# Patient Record
Sex: Male | Born: 1970 | Race: Black or African American | Hispanic: No | Marital: Married | Smoking: Never smoker
Health system: Southern US, Community
[De-identification: ages and names within clinical notes are randomized; demographics above are authoritative.]

## PROBLEM LIST (undated history)

## (undated) DIAGNOSIS — E785 Hyperlipidemia, unspecified: Secondary | ICD-10-CM

## (undated) DIAGNOSIS — E119 Type 2 diabetes mellitus without complications: Secondary | ICD-10-CM

## (undated) DIAGNOSIS — Z8613 Personal history of malaria: Secondary | ICD-10-CM

## (undated) HISTORY — DX: Type 2 diabetes mellitus without complications: E11.9

## (undated) HISTORY — DX: Personal history of malaria: Z86.13

## (undated) HISTORY — DX: Hyperlipidemia, unspecified: E78.5

---

## 1997-01-09 DIAGNOSIS — Z8613 Personal history of malaria: Secondary | ICD-10-CM

## 1997-01-09 HISTORY — DX: Personal history of malaria: Z86.13

## 2000-01-10 HISTORY — PX: KNEE ARTHROSCOPY: SUR90

## 2012-12-10 ENCOUNTER — Encounter: Payer: Self-pay | Admitting: Family Medicine

## 2012-12-10 ENCOUNTER — Ambulatory Visit (INDEPENDENT_AMBULATORY_CARE_PROVIDER_SITE_OTHER): Payer: BC Managed Care – PPO | Admitting: Family Medicine

## 2012-12-10 VITALS — BP 132/90 | HR 66 | Temp 98.1°F | Ht 69.0 in | Wt 181.0 lb

## 2012-12-10 DIAGNOSIS — R5381 Other malaise: Secondary | ICD-10-CM

## 2012-12-10 DIAGNOSIS — R5383 Other fatigue: Secondary | ICD-10-CM | POA: Insufficient documentation

## 2012-12-10 NOTE — Progress Notes (Signed)
   Subjective:    Patient ID: Gary Long, male    DOB: [birth year], 42 y.o.   MRN: [id number]  HPI CC: new pt to establish  Prior PCP , Dr Link Snuffer at [facility]  Works at [employer] - 3rd shift 3d/wk.  Melatonin has caused trouble adjusting so stopped taking.  Feels stays fatigued. bp slightly elevated today (higher than his normal). Recently took mucinex for cough which is now better.  Some knee pain s/p arthroscopy 2002 but persistent R knee discomfort.  Limits running.  Had MRI and steroid shot, as well as PT.  Preventative:  Last CPE 05/2012, cholesterol levels slightly elevated last check. Flu shot - done Tetanus - unsure.  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  Past histories reviewed and updated if relevant as below. There are no active problems to display for this patient.  Past Medical History  Diagnosis Date  . History of malaria 1999   Past Surgical History  Procedure Laterality Date  . Knee arthroscopy Right 2002   History  Substance Use Topics  . Smoking status: Never Smoker   . Smokeless tobacco: Never Used  . Alcohol Use: No   Family History  Problem Relation Age of Onset  . Stroke Mother   . Diabetes Mother   . Hypertension Sister   . Cancer Neg Hx   . CAD Neg Hx    Allergies  Allergen Reactions  . Chloroquine Itching   No current outpatient prescriptions on file prior to visit.   No current facility-administered medications on file prior to visit.     Review of Systems Per HPI    Objective:   Physical Exam  Nursing note and vitals reviewed. Constitutional: He is oriented to person, place, and time. He appears well-developed and well-nourished. No distress.  HENT:  Head: Normocephalic and atraumatic.  Right Ear: External ear normal.  Left Ear: External  ear normal.  Nose: Nose normal.  Mouth/Throat: Oropharynx is clear and moist. No oropharyngeal exudate.  Eyes: Conjunctivae and EOM are normal. Pupils are equal, round, and reactive to light. No scleral icterus.  Neck: Normal range of motion. Neck supple. No thyromegaly present.  Cardiovascular: Normal rate, regular rhythm, normal heart sounds and intact distal pulses.   No murmur heard. Pulses:      Radial pulses are 2+ on the right side, and 2+ on the left side.  Pulmonary/Chest: Effort normal and breath sounds normal. No respiratory distress. He has no wheezes. He has no rales.  Musculoskeletal: Normal range of motion. He exhibits no edema.  Lymphadenopathy:    He has no cervical adenopathy.  Neurological: He is alert and oriented to person, place, and time.  CN grossly intact, station and gait intact  Skin: Skin is warm and dry. No rash noted.  Psychiatric: He has a normal mood and affect. His behavior is normal. Judgment and thought content normal.       Assessment & Plan:

## 2012-12-10 NOTE — Progress Notes (Signed)
Pre-visit discussion using our clinic review tool. No additional management support is needed unless otherwise documented below in the visit note.  

## 2012-12-10 NOTE — Patient Instructions (Signed)
Good to meet you today, call us with questions. Return when you're due for next physical.

## 2012-12-10 NOTE — Assessment & Plan Note (Signed)
Anticipate related to 3rd shift work schedule. Discussed sleep hygiene measures to try including decreased stimulation prior to bedtime, calm quiet dark environment for sleep, and consider light excluding curtains. Will reassess next visit.

## 2013-01-13 ENCOUNTER — Encounter: Payer: Self-pay | Admitting: Internal Medicine

## 2013-01-13 ENCOUNTER — Ambulatory Visit (INDEPENDENT_AMBULATORY_CARE_PROVIDER_SITE_OTHER): Payer: BC Managed Care – PPO | Admitting: Internal Medicine

## 2013-01-13 VITALS — BP 118/72 | HR 96 | Temp 98.3°F | Wt 182.5 lb

## 2013-01-13 DIAGNOSIS — T148XXA Other injury of unspecified body region, initial encounter: Secondary | ICD-10-CM

## 2013-01-13 DIAGNOSIS — M940 Chondrocostal junction syndrome [Tietze]: Secondary | ICD-10-CM

## 2013-01-13 MED ORDER — IBUPROFEN 800 MG PO TABS: 800.0000 mg | ORAL_TABLET | Freq: Three times a day (TID) | ORAL | Status: DC | PRN

## 2013-01-13 MED ORDER — CYCLOBENZAPRINE HCL 10 MG PO TABS: 10.0000 mg | ORAL_TABLET | Freq: Three times a day (TID) | ORAL | Status: DC | PRN

## 2013-01-13 NOTE — Patient Instructions (Signed)
Costochondritis Costochondritis (Tietze syndrome), or costochondral separation, is a swelling and irritation (inflammation) of the tissue (cartilage) that connects your ribs with your breastbone (sternum). It may occur on its own (spontaneously), through damage caused by an accident (trauma), or simply from coughing or minor exercise. It may take up to 6 weeks to get better and longer if you are unable to be conservative in your activities. HOME CARE INSTRUCTIONS   Avoid exhausting physical activity. Try not to strain your ribs during normal activity. This would include any activities using chest, belly (abdominal), and side muscles, especially if heavy weights are used.  Use ice for 15-20 minutes per hour while awake for the first 2 days. Place the ice in a plastic bag, and place a towel between the bag of ice and your skin.  Only take over-the-counter or prescription medicines for pain, discomfort, or fever as directed by your caregiver. SEEK IMMEDIATE MEDICAL CARE IF:   Your pain increases or you are very uncomfortable.  You have a fever.  You develop difficulty with your breathing.  You cough up blood.  You develop worse chest pains, shortness of breath, sweating, or vomiting.  You develop new, unexplained problems (symptoms). MAKE SURE YOU:   Understand these instructions.  Will watch your condition.  Will get help right away if you are not doing well or get worse. Document Released: 10/05/2004 Document Revised: 03/20/2011 Document Reviewed: 07/30/2012 Exit[employer] Patient Information 2014 [employer].  

## 2013-01-13 NOTE — Progress Notes (Signed)
Pre-visit discussion using our clinic review tool. No additional management support is needed unless otherwise documented below in the visit note.  

## 2013-01-13 NOTE — Progress Notes (Signed)
   Subjective:    Patient ID: Gary Long, male    DOB: 29-Mar-1970, 43 y.o.   MRN: [id number]  HPI  Pt presents to the clinic today with c/o chest discomfort and back pain. This started about 3 weeks ago. The chest discomfort is on both sides of his sternum. His pain occurs with bending, twisting and lifting. He denies chest pain, chest tightness, reflux, or shortness of breath. He denies any specific injury. He has taken tylenol OTC without much relief.  Review of Systems      Past Medical History  Diagnosis Date  . History of malaria 1999    No current outpatient prescriptions on file.   No current facility-administered medications for this visit.    Allergies  Allergen Reactions  . Chloroquine Itching    Family History  Problem Relation Age of Onset  . Stroke Mother   . Diabetes Mother   . Hypertension Sister   . Cancer Neg Hx   . CAD Neg Hx     History   Social History  . Marital Status: Married    Spouse Name: N/A    Number of Children: N/A  . Years of Education: N/A   Occupational History  . Not on file.   Social History Main Topics  . Smoking status: Never Smoker   . Smokeless tobacco: Never Used  . Alcohol Use: No  . Drug Use: No  . Sexual Activity: Not on file   Other Topics Concern  . 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  Respiratory: Denies difficulty breathing, shortness of breath, cough or sputum production.   Cardiovascular: Denies chest pain, chest tightness, palpitations or swelling in the hands or feet.  Musculoskeletal: Denies decrease in range of motion, difficulty with gait, or joint pain and swelling.    No other specific complaints in a complete review of systems (except  as listed in HPI above).  Objective:   Physical Exam  BP 118/72  Pulse 96  Temp(Src) 98.3 F (36.8 C) (Oral)  Wt 182 lb 8 oz (82.781 kg)  SpO2 99% Wt Readings from Last 3 Encounters:  01/13/13 182 lb 8 oz (82.781 kg)  12/10/12 181 lb (82.101 kg)    General: Appears his stated age, well developed, well nourished in NAD. Cardiovascular: Normal rate and rhythm. S1,S2 noted.  No murmur, rubs or gallops noted. No JVD or BLE edema. No carotid bruits noted. Pulmonary/Chest: Normal effort and positive vesicular breath sounds. No respiratory distress. No wheezes, rales or ronchi noted.  Musculoskeletal: Normal range of motion. No signs of joint swelling. No difficulty with gait. Pinpoint tenderness on costochondral junction bilateral. Trapezius muscle noted to be tense and tight.        Assessment & Plan:   Muscle strain and costochondritis:  eRx for 800 mg advil every 8 hours as needed prn eRx for flexeril at night only Pt advised to perform stretching exercises as indicated on handout and avoid heavy lifting for the next week  RTC as needed or if symptoms persist or worsen

## 2013-01-31 LAB — COMPREHENSIVE METABOLIC PANEL
ALT: 29 U/L (ref 10–40)
AST: 23 U/L
Alkaline Phosphatase: 76 U/L
BUN: 14 mg/dL (ref 4–21)
CREATININE: 1
Glucose: 107
Potassium: 4.4 mmol/L
SODIUM: 138 mmol/L (ref 137–147)
Total Bilirubin: 0.4 mg/dL

## 2013-01-31 LAB — PSA: PSA: 1.124

## 2013-01-31 LAB — LIPID PANEL
Cholesterol: 228 mg/dL — AB (ref 0–200)
HDL: 47 mg/dL (ref 35–70)
LDL (calc): 163
TRIGLYCERIDES: 91

## 2013-01-31 LAB — CBC
HGB: 13.8 g/dL
WBC: 4.9
platelet count: 155

## 2013-01-31 LAB — TESTOSTERONE
Testosterone, Free: 8
Testosterone: 444

## 2013-01-31 LAB — TSH: TSH: 0.91

## 2013-02-04 ENCOUNTER — Ambulatory Visit: Payer: Self-pay | Admitting: Family Medicine

## 2013-03-25 ENCOUNTER — Ambulatory Visit: Payer: BC Managed Care – PPO | Admitting: Internal Medicine

## 2013-03-25 ENCOUNTER — Emergency Department: Payer: Self-pay | Admitting: Emergency Medicine

## 2013-03-25 LAB — RAPID INFLUENZA A&B ANTIGENS

## 2013-03-28 ENCOUNTER — Ambulatory Visit (INDEPENDENT_AMBULATORY_CARE_PROVIDER_SITE_OTHER): Payer: BC Managed Care – PPO | Admitting: Family Medicine

## 2013-03-28 ENCOUNTER — Encounter: Payer: Self-pay | Admitting: Family Medicine

## 2013-03-28 VITALS — BP 132/90 | HR 72 | Temp 97.8°F | Wt 183.8 lb

## 2013-03-28 DIAGNOSIS — J209 Acute bronchitis, unspecified: Secondary | ICD-10-CM

## 2013-03-28 DIAGNOSIS — S29012A Strain of muscle and tendon of back wall of thorax, initial encounter: Secondary | ICD-10-CM

## 2013-03-28 DIAGNOSIS — S239XXA Sprain of unspecified parts of thorax, initial encounter: Secondary | ICD-10-CM

## 2013-03-28 DIAGNOSIS — R079 Chest pain, unspecified: Secondary | ICD-10-CM

## 2013-03-28 DIAGNOSIS — R0789 Other chest pain: Secondary | ICD-10-CM | POA: Insufficient documentation

## 2013-03-28 MED ORDER — NAPROXEN 500 MG PO TABS: ORAL_TABLET | ORAL | Status: DC

## 2013-03-28 MED ORDER — METHOCARBAMOL 500 MG PO TABS: 500.0000 mg | ORAL_TABLET | Freq: Three times a day (TID) | ORAL | Status: DC | PRN

## 2013-03-28 NOTE — Progress Notes (Signed)
BP 132/90  Pulse 72  Temp(Src) 97.8 F (36.6 C) (Oral)  Wt 183 lb 12 oz (83.348 kg)  SpO2 97%   CC: ER f/u  Subjective:    Patient ID: Gary Long, male    DOB: 1970-12-29, 43 y.o.   MRN: [id number]  HPI: Gary Long is a 43 y.o. male presenting on 03/28/2013 for Follow-up   [facility] for chest pain, back pain and congestion (03/24/2013).  Dx with bronchitis and placed on zpack.  CXR was ok per patient report.  Seen last month with dx costochondritis, treated with flexeril (oversedation) and ibuprofen (which helps but only temporarily).  Hydrocodone prescribed at hospital but causes headache.  Feeling much better but with persistent chest and left back pain.  Intermittently sharp stabbing, other times tight.  Worse with coughing and prolonged standing.  Has also tried tylenol for discomfort.  No CAD hx in family.  Runs for exercise - twice a week for 1 mile.  No pain with running.  Still with dry cough, but sleeping ok.  Slight headache.  Denies wheezing or dyspnea, fevers, abd pain, nausea.  Finished zpack this morning.  Describes persistent sternal pain reproducible with palpation as well as pain at rhomboid L>R also reproducible with palpation.  No pain with arm/shoulder movements.  Denies inciting trauma/injury but pain did start after heavy lifting while moving.  ADDENDUM ==> [facility] - flu swab negative, CXR WNL asked to scan.  Relevant past medical, surgical, family and social history reviewed and updated as indicated.  Allergies and medications reviewed and updated. No current outpatient prescriptions on file prior to visit.   No current facility-administered medications on file prior to visit.    Review of Systems Per HPI unless specifically indicated above    Objective:    BP 132/90  Pulse 72  Temp(Src) 97.8 F (36.6 C) (Oral)  Wt 183 lb 12 oz (83.348 kg)  SpO2 97%  Physical Exam  Nursing note and vitals  reviewed. Constitutional: He appears well-developed and well-nourished. No distress.  HENT:  Head: Normocephalic and atraumatic.  Right Ear: Hearing, tympanic membrane, external ear and ear canal normal.  Left Ear: Hearing, tympanic membrane, external ear and ear canal normal.  Nose: Nose normal. No mucosal edema or rhinorrhea. Right sinus exhibits no maxillary sinus tenderness and no frontal sinus tenderness. Left sinus exhibits no maxillary sinus tenderness and no frontal sinus tenderness.  Mouth/Throat: Uvula is midline, oropharynx is clear and moist and mucous membranes are normal. No oropharyngeal exudate, posterior oropharyngeal edema, posterior oropharyngeal erythema or tonsillar abscesses.  Persistent nasal congestion L>R  Eyes: Conjunctivae and EOM are normal. Pupils are equal, round, and reactive to light. No scleral icterus.  Neck: Normal range of motion. Neck supple.  Cardiovascular: Normal rate, regular rhythm, normal heart sounds and intact distal pulses.   No murmur heard. Pulmonary/Chest: Effort normal and breath sounds normal. No respiratory distress. He has no wheezes. He has no rales. He exhibits tenderness (mild midline tenderness to palpation of sternum).  Musculoskeletal: He exhibits no edema.  No midline spine tenderness. Tender to palpation at L rhomboids. FROM at shoulders No pain to palpation of shoulder landmarks  Lymphadenopathy:    He has no cervical adenopathy.  Skin: Skin is warm and dry. No rash noted.   No results found for this or any previous visit.    Assessment & Plan:   Problem List Items Addressed This Visit   Acute bronchitis -  Primary     Reassured - should continue to heal well.  Pt declines cough syrup today.    Rhomboid muscle strain     Provided with exercises from [facility] pt advisor, rec nsaid and robaxin prn.  See pt instructions for plan. If no better consider PT referral.    Sternal pain     Unclear etiology.  Not quite consistent with  costochondritis today as he has more midline discomfort to palpation, and doesn't give trauma hx for sternal contusion/fracture. Will treat supportively as per above with NSAID, muscle relaxant.   Update if not improving.        Follow up plan: Return if symptoms worsen or fail to improve.

## 2013-03-28 NOTE — Assessment & Plan Note (Signed)
Provided with exercises from [facility] pt advisor, rec nsaid and robaxin prn.  See pt instructions for plan. If no better consider PT referral.

## 2013-03-28 NOTE — Patient Instructions (Signed)
I think you have sternal sprain and rhomboid strain. Treat with stretching exercises provided today, start methocarbamol muscle relaxant (may also make you sleepy), naprosyn twice daily with food for 5 days then as needed, and ice/heat to back (whichever soothes better) Let me know how you are doing with above treatment plan.

## 2013-03-28 NOTE — Assessment & Plan Note (Signed)
Unclear etiology.  Not quite consistent with costochondritis today as he has more midline discomfort to palpation, and doesn't give trauma hx for sternal contusion/fracture. Will treat supportively as per above with NSAID, muscle relaxant.   Update if not improving.

## 2013-03-28 NOTE — Progress Notes (Signed)
Pre visit review using our clinic review tool, if applicable. No additional management support is needed unless otherwise documented below in the visit note. 

## 2013-03-28 NOTE — Assessment & Plan Note (Signed)
Reassured - should continue to heal well.  Pt declines cough syrup today.

## 2013-03-29 ENCOUNTER — Encounter: Payer: Self-pay | Admitting: Family Medicine

## 2013-04-28 ENCOUNTER — Encounter: Payer: Self-pay | Admitting: *Deleted

## 2013-06-13 ENCOUNTER — Other Ambulatory Visit (INDEPENDENT_AMBULATORY_CARE_PROVIDER_SITE_OTHER): Payer: BC Managed Care – PPO

## 2013-06-13 ENCOUNTER — Telehealth: Payer: Self-pay | Admitting: *Deleted

## 2013-06-13 DIAGNOSIS — E785 Hyperlipidemia, unspecified: Secondary | ICD-10-CM

## 2013-06-13 DIAGNOSIS — R5383 Other fatigue: Secondary | ICD-10-CM

## 2013-06-13 DIAGNOSIS — R5381 Other malaise: Secondary | ICD-10-CM

## 2013-06-13 HISTORY — DX: Hyperlipidemia, unspecified: E78.5

## 2013-06-13 LAB — LIPID PANEL
CHOLESTEROL: 216 mg/dL — AB (ref 0–200)
HDL: 62.9 mg/dL (ref >39.00)
LDL Cholesterol: 142 mg/dL — ABNORMAL HIGH (ref 0–99)
NonHDL: 153.1
TRIGLYCERIDES: 58 mg/dL (ref 0.0–149.0)
Total CHOL/HDL Ratio: 3
VLDL: 11.6 mg/dL (ref 0.0–40.0)

## 2013-06-13 LAB — VITAMIN B12: Vitamin B-12: 476 pg/mL (ref 211–911)

## 2013-06-13 NOTE — Telephone Encounter (Signed)
Labs ordered today.Thanks.

## 2013-06-13 NOTE — Telephone Encounter (Signed)
Pt walked in this am for labs. He stated he was called to come in for labs prior to his physical scheduled on 06/27/13. I could not find any lab orders for him on record. He insisted that he was called to come in, and that he had a physical scheduled. I didn't see any record of him having been contacted regarding a lab appt.  I checked appt history, but neither appointments were scheduled, with no record of any having ever been scheduled. Per the front office staff at check in, he had walked in and made the appt today.  I informed him that I did not have lab orders, and that he may not be due for labs since he just had labs done in 1/15. I also informed him that I could draw his blood, but that it would be discarded if his Dr determined no labs needed to be done. He agreed, and I drew his blood. I also advised him to stop by the front desk to schedule an appointment for cpx, which he agreed to do. Does he need to have any labs drawn for a physical?

## 2013-11-27 ENCOUNTER — Ambulatory Visit (INDEPENDENT_AMBULATORY_CARE_PROVIDER_SITE_OTHER): Payer: BC Managed Care – PPO | Admitting: Family Medicine

## 2013-11-27 ENCOUNTER — Encounter: Payer: Self-pay | Admitting: Family Medicine

## 2013-11-27 ENCOUNTER — Encounter: Payer: BC Managed Care – PPO | Admitting: Family Medicine

## 2013-11-27 VITALS — BP 124/82 | HR 88 | Temp 98.0°F | Ht 69.0 in | Wt 172.5 lb

## 2013-11-27 DIAGNOSIS — Z0000 Encounter for general adult medical examination without abnormal findings: Secondary | ICD-10-CM

## 2013-11-27 DIAGNOSIS — S83289A Other tear of lateral meniscus, current injury, unspecified knee, initial encounter: Secondary | ICD-10-CM | POA: Insufficient documentation

## 2013-11-27 DIAGNOSIS — Z0001 Encounter for general adult medical examination with abnormal findings: Secondary | ICD-10-CM | POA: Insufficient documentation

## 2013-11-27 DIAGNOSIS — M25562 Pain in left knee: Secondary | ICD-10-CM

## 2013-11-27 DIAGNOSIS — E119 Type 2 diabetes mellitus without complications: Secondary | ICD-10-CM

## 2013-11-27 DIAGNOSIS — E1169 Type 2 diabetes mellitus with other specified complication: Secondary | ICD-10-CM | POA: Insufficient documentation

## 2013-11-27 DIAGNOSIS — E118 Type 2 diabetes mellitus with unspecified complications: Secondary | ICD-10-CM | POA: Insufficient documentation

## 2013-11-27 DIAGNOSIS — F524 Premature ejaculation: Secondary | ICD-10-CM

## 2013-11-27 DIAGNOSIS — R350 Frequency of micturition: Secondary | ICD-10-CM

## 2013-11-27 DIAGNOSIS — R739 Hyperglycemia, unspecified: Secondary | ICD-10-CM

## 2013-11-27 DIAGNOSIS — E785 Hyperlipidemia, unspecified: Secondary | ICD-10-CM

## 2013-11-27 HISTORY — DX: Type 2 diabetes mellitus without complications: E11.9

## 2013-11-27 LAB — POCT URINALYSIS DIPSTICK
BILIRUBIN UA: NEGATIVE
Blood, UA: NEGATIVE
GLUCOSE UA: NEGATIVE
KETONES UA: NEGATIVE
Leukocytes, UA: NEGATIVE
Nitrite, UA: NEGATIVE
Protein, UA: NEGATIVE
SPEC GRAV UA: 1.015
Urobilinogen, UA: 0.2
pH, UA: 6

## 2013-11-27 NOTE — Assessment & Plan Note (Signed)
Anticipate PFPS on left. Marked crepitus of patellofemoral compartment noted today. rec lateral leg raises and handout from [facility] pt advisor provided today.

## 2013-11-27 NOTE — Assessment & Plan Note (Signed)
Reviewed previous labwork. LDL mildly elevated off meds.

## 2013-11-27 NOTE — Patient Instructions (Addendum)
Return at your convenience for fasting labs - to check sugar level. For knee - do lateral leg raises to strengthen medial quads. Exercises also provided today. Urine looking normal today. Good to see you today, call us with questions.

## 2013-11-27 NOTE — Progress Notes (Signed)
Pre visit review using our clinic review tool, if applicable. No additional management support is needed unless otherwise documented below in the visit note. 

## 2013-11-27 NOTE — Assessment & Plan Note (Signed)
UA normal today. No nocturia, doubt BPH related.

## 2013-11-27 NOTE — Progress Notes (Signed)
BP 124/82 mmHg  Pulse 88  Temp(Src) 98 F (36.7 C) (Oral)  Ht 5\' 9"  (1.753 m)  Wt 172 lb 8 oz (78.245 kg)  BMI 25.46 kg/m2   CC: CPE  Subjective:    Patient ID: Gary Long, male    DOB: July 15, 1970, 43 y.o.   MRN: [id number]  HPI: Gary Long is a 43 y.o. male presenting on 11/27/2013 for Annual Exam   Over last several months noticing increase in frequency - but denies dysuria, urgency, hematuria, abd pain or fevers. No significant nocturia.   Some bilateral knee pain ongoing for several weeks.   Wt Readings from Last 3 Encounters:  11/27/13 172 lb 8 oz (78.245 kg)  03/28/13 183 lb 12 oz (83.348 kg)  01/13/13 182 lb 8 oz (82.781 kg)  Body mass index is 25.46 kg/(m^2). attributes to stress.  Preventative: No fmhx prostate trouble. 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  Allergies and medications reviewed and updated. No current outpatient prescriptions on file prior to visit.   No current facility-administered medications on file prior to visit.    Review of Systems  Constitutional: Positive for unexpected weight change (dropped 2/2 stress). Negative for fever, chills, activity change, appetite change and fatigue.  HENT: Negative for hearing loss.   Eyes: Negative for visual disturbance.  Respiratory: Negative for cough, chest tightness, shortness of breath and wheezing.   Cardiovascular: Negative for chest pain, palpitations and leg swelling.  Gastrointestinal: Negative for nausea, vomiting, abdominal pain, diarrhea, constipation, blood in stool and abdominal distention.  Genitourinary: Negative for hematuria and difficulty  urinating.  Musculoskeletal: Negative for myalgias, arthralgias and neck pain.  Skin: Negative for rash.  Neurological: Negative for dizziness, seizures, syncope and headaches.  Hematological: Negative for adenopathy. Does not bruise/bleed easily.  Psychiatric/Behavioral: Negative for dysphoric mood. The patient is not nervous/anxious.    Per HPI unless specifically indicated above    Objective:    BP 124/82 mmHg  Pulse 88  Temp(Src) 98 F (36.7 C) (Oral)  Ht 5\' 9"  (1.753 m)  Wt 172 lb 8 oz (78.245 kg)  BMI 25.46 kg/m2  Physical Exam  Constitutional: He is oriented to person, place, and time. He appears well-developed and well-nourished. No distress.  HENT:  Head: Normocephalic and atraumatic.  Right Ear: Hearing, tympanic membrane, external ear and ear canal normal.  Left Ear: Hearing, tympanic membrane, external ear and ear canal normal.  Nose: Nose normal.  Mouth/Throat: Uvula is midline, oropharynx is clear and moist and mucous membranes are normal. No oropharyngeal exudate, posterior oropharyngeal edema or posterior oropharyngeal erythema.  Eyes: Conjunctivae and EOM are normal. Pupils are equal, round, and reactive to light. No scleral icterus.  Neck: Normal range of motion. Neck supple. No thyromegaly present.  Cardiovascular: Normal rate, regular rhythm, normal heart sounds and intact distal pulses.   No murmur heard. Pulses:      Radial pulses are 2+ on the right side, and 2+ on the left side.  Pulmonary/Chest: Effort normal and breath sounds normal. No respiratory distress. He has no wheezes. He has no rales.  Abdominal: Soft. Bowel sounds are normal. He exhibits no distension and no mass. There is no tenderness. There is  no rebound and no guarding.  Musculoskeletal: Normal range of motion. He exhibits no edema.  Left Knee exam: No deformity on inspection. No pain with palpation of knee landmarks. No effusion/swelling noted. FROM in flex/extension without  crepitus. No popliteal fullness. Neg drawer test. Neg mcmurray test. No pain with valgus/varus stress. + PFgrind with crepitus present but no significant pain.  Lymphadenopathy:    He has no cervical adenopathy.  Neurological: He is alert and oriented to person, place, and time.  CN grossly intact, station and gait intact  Skin: Skin is warm and dry. No rash noted.  Psychiatric: He has a normal mood and affect. His behavior is normal. Judgment and thought content normal.  Nursing note and vitals reviewed.  Results for orders placed or performed in visit on 11/27/13  POCT Urinalysis Dipstick  Result Value Ref Range   Color, UA Yellow    Clarity, UA Clear    Glucose, UA Negative    Bilirubin, UA Negative    Ketones, UA Negative    Spec Grav, UA 1.015    Blood, UA Negative    pH, UA 6.0    Protein, UA Negative    Urobilinogen, UA 0.2    Nitrite, UA Negative    Leukocytes, UA Negative       Assessment & Plan:   Problem List Items Addressed This Visit    Premature ejaculation    Along with some ED. Discussed etiologies and treatment options, pt will notify me if desires to try SSRI    Left knee pain    Anticipate PFPS on left. Marked crepitus of patellofemoral compartment noted today. rec lateral leg raises and handout from [facility] pt advisor provided today.    Hyperglycemia    labwork 01/2013 per pt was fasting and glu 107. Pt will return fasting for labs at his convenience to recheck glu.    Relevant Orders      Basic metabolic panel   HLD (hyperlipidemia)    Reviewed previous labwork. LDL mildly elevated off meds.    Health maintenance examination - Primary    Preventative protocols reviewed and updated unless pt declined. Discussed healthy diet and lifestyle.     Frequent urination    UA normal today. No nocturia, doubt BPH related.     Relevant Orders      POCT Urinalysis Dipstick (Completed)       Follow up plan: Return in about 1 year (around 11/28/2014), or  as needed, for annual exam, prior fasting for blood work.

## 2013-11-27 NOTE — Assessment & Plan Note (Signed)
Preventative protocols reviewed and updated unless pt declined. Discussed healthy diet and lifestyle.  

## 2013-11-27 NOTE — Assessment & Plan Note (Signed)
Along with some ED. Discussed etiologies and treatment options, pt will notify me if desires to try SSRI

## 2013-11-27 NOTE — Assessment & Plan Note (Signed)
labwork 01/2013 per pt was fasting and glu 107. Pt will return fasting for labs at his convenience to recheck glu.

## 2014-07-05 ENCOUNTER — Emergency Department
Admission: EM | Admit: 2014-07-05 | Discharge: 2014-07-05 | Discharge status: Against Medical Advice | Payer: BC Managed Care – PPO | Attending: Emergency Medicine | Admitting: Emergency Medicine

## 2014-07-05 ENCOUNTER — Encounter: Payer: Self-pay | Admitting: Emergency Medicine

## 2014-07-05 ENCOUNTER — Other Ambulatory Visit: Payer: Self-pay

## 2014-07-05 DIAGNOSIS — R51 Headache: Secondary | ICD-10-CM | POA: Insufficient documentation

## 2014-07-05 DIAGNOSIS — R0789 Other chest pain: Secondary | ICD-10-CM | POA: Diagnosis not present

## 2014-07-05 LAB — BASIC METABOLIC PANEL
Anion gap: 8 (ref 5–15)
BUN: 14 mg/dL (ref 6–20)
CALCIUM: 8.7 mg/dL — AB (ref 8.9–10.3)
CO2: 28 mmol/L (ref 22–32)
Chloride: 99 mmol/L — ABNORMAL LOW (ref 101–111)
Creatinine, Ser: 0.97 mg/dL (ref 0.61–1.24)
Glucose, Bld: 192 mg/dL — ABNORMAL HIGH (ref 65–99)
Potassium: 3.7 mmol/L (ref 3.5–5.1)
SODIUM: 135 mmol/L (ref 135–145)

## 2014-07-05 LAB — CBC
HCT: 40.9 % (ref 40.0–52.0)
HEMOGLOBIN: 13.2 g/dL (ref 13.0–18.0)
MCH: 28.4 pg (ref 26.0–34.0)
MCHC: 32.2 g/dL (ref 32.0–36.0)
MCV: 88 fL (ref 80.0–100.0)
Platelets: 136 10*3/uL — ABNORMAL LOW (ref 150–440)
RBC: 4.65 MIL/uL (ref 4.40–5.90)
RDW: 13.6 % (ref 11.5–14.5)
WBC: 5.6 10*3/uL (ref 3.8–10.6)

## 2014-07-05 LAB — TROPONIN I: Troponin I: <0.03 ng/mL (ref <0.031)

## 2014-07-05 NOTE — ED Notes (Signed)
Called for patient in lobby multiple times with no response.

## 2014-07-05 NOTE — ED Notes (Signed)
midsternal chest pain,, simular to prior chest pain x3 weeks ago , today burning in throat , and headache photosenitive ,

## 2015-03-03 ENCOUNTER — Emergency Department (HOSPITAL_COMMUNITY): Payer: BC Managed Care – PPO

## 2015-03-03 ENCOUNTER — Encounter (HOSPITAL_COMMUNITY): Payer: Self-pay | Admitting: *Deleted

## 2015-03-03 DIAGNOSIS — R079 Chest pain, unspecified: Secondary | ICD-10-CM | POA: Insufficient documentation

## 2015-03-03 LAB — CBC
HEMATOCRIT: 40.8 % (ref 39.0–52.0)
Hemoglobin: 13.2 g/dL (ref 13.0–17.0)
MCH: 28.1 pg (ref 26.0–34.0)
MCHC: 32.4 g/dL (ref 30.0–36.0)
MCV: 87 fL (ref 78.0–100.0)
Platelets: 144 10*3/uL — ABNORMAL LOW (ref 150–400)
RBC: 4.69 MIL/uL (ref 4.22–5.81)
RDW: 12.9 % (ref 11.5–15.5)
WBC: 4.9 10*3/uL (ref 4.0–10.5)

## 2015-03-03 LAB — BASIC METABOLIC PANEL
ANION GAP: 11 (ref 5–15)
BUN: 16 mg/dL (ref 6–20)
CO2: 24 mmol/L (ref 22–32)
Calcium: 9.2 mg/dL (ref 8.9–10.3)
Chloride: 104 mmol/L (ref 101–111)
Creatinine, Ser: 1.16 mg/dL (ref 0.61–1.24)
GFR calc non Af Amer: >60 mL/min (ref >60)
Glucose, Bld: 116 mg/dL — ABNORMAL HIGH (ref 65–99)
Potassium: 4 mmol/L (ref 3.5–5.1)
SODIUM: 139 mmol/L (ref 135–145)

## 2015-03-03 LAB — I-STAT TROPONIN, ED: TROPONIN I, POC: 0 ng/mL (ref 0.00–0.08)

## 2015-03-03 NOTE — ED Notes (Signed)
Pt c/o left sided chest pain that radiates to his neck starting tonight.

## 2015-03-04 ENCOUNTER — Emergency Department (HOSPITAL_COMMUNITY)
Admission: EM | Admit: 2015-03-04 | Discharge: 2015-03-04 | Disposition: A | Discharge status: Home / Self Care | Payer: BC Managed Care – PPO | Attending: Emergency Medicine | Admitting: Emergency Medicine

## 2015-03-04 HISTORY — DX: Hyperlipidemia, unspecified: E78.5

## 2015-03-04 NOTE — ED Notes (Signed)
No answer for vital sign recheck. 

## 2015-10-07 ENCOUNTER — Ambulatory Visit (INDEPENDENT_AMBULATORY_CARE_PROVIDER_SITE_OTHER): Payer: BLUE CROSS/BLUE SHIELD | Admitting: Family Medicine

## 2015-10-07 ENCOUNTER — Encounter: Payer: Self-pay | Admitting: Family Medicine

## 2015-10-07 VITALS — BP 140/90 | HR 80 | Temp 98.1°F | Ht 69.0 in | Wt 189.5 lb

## 2015-10-07 DIAGNOSIS — R350 Frequency of micturition: Secondary | ICD-10-CM | POA: Diagnosis not present

## 2015-10-07 DIAGNOSIS — Z0000 Encounter for general adult medical examination without abnormal findings: Secondary | ICD-10-CM

## 2015-10-07 DIAGNOSIS — E785 Hyperlipidemia, unspecified: Secondary | ICD-10-CM

## 2015-10-07 DIAGNOSIS — Z23 Encounter for immunization: Secondary | ICD-10-CM | POA: Diagnosis not present

## 2015-10-07 DIAGNOSIS — R5382 Chronic fatigue, unspecified: Secondary | ICD-10-CM

## 2015-10-07 DIAGNOSIS — R739 Hyperglycemia, unspecified: Secondary | ICD-10-CM

## 2015-10-07 LAB — POC URINALSYSI DIPSTICK (AUTOMATED)
Bilirubin, UA: NEGATIVE
Blood, UA: NEGATIVE
Glucose, UA: NEGATIVE
Ketones, UA: NEGATIVE
Leukocytes, UA: NEGATIVE
NITRITE UA: NEGATIVE
SPEC GRAV UA: 1.015
UROBILINOGEN UA: 0.2
pH, UA: 7

## 2015-10-07 NOTE — Assessment & Plan Note (Signed)
Update FLP when returns fasting tomorrow.

## 2015-10-07 NOTE — Patient Instructions (Addendum)
Flu shot today Come in tomorrow for fasting labs.  You are doing well. Continue healthy diet choices - try to increase fruits and vegetables.   Health Maintenance, Male A healthy lifestyle and preventative care can promote health and wellness.  Maintain regular health, dental, and eye exams.  Eat a healthy diet. Foods like vegetables, fruits, whole grains, low-fat dairy products, and lean protein foods contain the nutrients you need and are low in calories. Decrease your intake of foods high in solid fats, added sugars, and salt. Get information about a proper diet from your health care provider, if necessary.  Regular physical exercise is one of the most important things you can do for your health. Most adults should get at least 150 minutes of moderate-intensity exercise (any activity that increases your heart rate and causes you to sweat) each week. In addition, most adults need muscle-strengthening exercises on 2 or more days a week.   Maintain a healthy weight. The body mass index (BMI) is a screening tool to identify possible weight problems. It provides an estimate of body fat based on height and weight. Your health care provider can find your BMI and can help you achieve or maintain a healthy weight. For males 20 years and older:  A BMI below 18.5 is considered underweight.  A BMI of 18.5 to 24.9 is normal.  A BMI of 25 to 29.9 is considered overweight.  A BMI of 30 and above is considered obese.  Maintain normal blood lipids and cholesterol by exercising and minimizing your intake of saturated fat. Eat a balanced diet with plenty of fruits and vegetables. Blood tests for lipids and cholesterol should begin at age 45 and be repeated every 5 years. If your lipid or cholesterol levels are high, you are over age 45, or you are at high risk for heart disease, you may need your cholesterol levels checked more frequently.Ongoing high lipid and cholesterol levels should be treated with  medicines if diet and exercise are not working.  If you smoke, find out from your health care provider how to quit. If you do not use tobacco, do not start.  Lung cancer screening is recommended for adults aged 55-80 years who are at high risk for developing lung cancer because of a history of smoking. A yearly low-dose CT scan of the lungs is recommended for people who have at least a 30-pack-year history of smoking and are current smokers or have quit within the past 15 years. A pack year of smoking is smoking an average of 1 pack of cigarettes a day for 1 year (for example, a 30-pack-year history of smoking could mean smoking 1 pack a day for 30 years or 2 packs a day for 15 years). Yearly screening should continue until the smoker has stopped smoking for at least 15 years. Yearly screening should be stopped for people who develop a health problem that would prevent them from having lung cancer treatment.  If you choose to drink alcohol, do not have more than 2 drinks per day. One drink is considered to be 12 oz (360 mL) of beer, 5 oz (150 mL) of wine, or 1.5 oz (45 mL) of liquor.  Avoid the use of street drugs. Do not share needles with anyone. Ask for help if you need support or instructions about stopping the use of drugs.  High blood pressure causes heart disease and increases the risk of stroke. High blood pressure is more likely to develop in:  People who  have blood pressure in the end of the normal range (100-139/85-89 mm Hg).  People who are overweight or obese.  People who are African American.  If you are 65-53 years of age, have your blood pressure checked every 3-5 years. If you are 46 years of age or older, have your blood pressure checked every year. You should have your blood pressure measured twice--once when you are at a hospital or clinic, and once when you are not at a hospital or clinic. Record the average of the two measurements. To check your blood pressure when you are not  at a hospital or clinic, you can use:  An automated blood pressure machine at a pharmacy.  A home blood pressure monitor.  If you are 91-60 years old, ask your health care provider if you should take aspirin to prevent heart disease.  Diabetes screening involves taking a blood sample to check your fasting blood sugar level. This should be done once every 3 years after age 63 if you are at a normal weight and without risk factors for diabetes. Testing should be considered at a younger age or be carried out more frequently if you are overweight and have at least 1 risk factor for diabetes.  Colorectal cancer can be detected and often prevented. Most routine colorectal cancer screening begins at the age of 33 and continues through age 50. However, your health care provider may recommend screening at an earlier age if you have risk factors for colon cancer. On a yearly basis, your health care provider may provide home test kits to check for hidden blood in the stool. A small camera at the end of a tube may be used to directly examine the colon (sigmoidoscopy or colonoscopy) to detect the earliest forms of colorectal cancer. Talk to your health care provider about this at age 72 when routine screening begins. A direct exam of the colon should be repeated every 5-10 years through age 60, unless early forms of precancerous polyps or small growths are found.  People who are at an increased risk for hepatitis B should be screened for this virus. You are considered at high risk for hepatitis B if:  You were born in a country where hepatitis B occurs often. Talk with your health care provider about which countries are considered high risk.  Your parents were born in a high-risk country and you have not received a shot to protect against hepatitis B (hepatitis B vaccine).  You have HIV or AIDS.  You use needles to inject street drugs.  You live with, or have sex with, someone who has hepatitis B.  You  are a man who has sex with other men (MSM).  You get hemodialysis treatment.  You take certain medicines for conditions like cancer, organ transplantation, and autoimmune conditions.  Hepatitis C blood testing is recommended for all people born from [birth year] through 1965 and any individual with known risk factors for hepatitis C.  Healthy men should no longer receive prostate-specific antigen (PSA) blood tests as part of routine cancer screening. Talk to your health care provider about prostate cancer screening.  Testicular cancer screening is not recommended for adolescents or adult males who have no symptoms. Screening includes self-exam, a health care provider exam, and other screening tests. Consult with your health care provider about any symptoms you have or any concerns you have about testicular cancer.  Practice safe sex. Use condoms and avoid high-risk sexual practices to reduce the spread of sexually transmitted  infections (STIs).  You should be screened for STIs, including gonorrhea and chlamydia if:  You are sexually active and are younger than 24 years.  You are older than 24 years, and your health care provider tells you that you are at risk for this type of infection.  Your sexual activity has changed since you were last screened, and you are at an increased risk for chlamydia or gonorrhea. Ask your health care provider if you are at risk.  If you are at risk of being infected with HIV, it is recommended that you take a prescription medicine daily to prevent HIV infection. This is called pre-exposure prophylaxis (PrEP). You are considered at risk if:  You are a man who has sex with other men (MSM).  You are a heterosexual man who is sexually active with multiple partners.  You take drugs by injection.  You are sexually active with a partner who has HIV.  Talk with your health care provider about whether you are at high risk of being infected with HIV. If you choose to begin  PrEP, you should first be tested for HIV. You should then be tested every 3 months for as long as you are taking PrEP.  Use sunscreen. Apply sunscreen liberally and repeatedly throughout the day. You should seek shade when your shadow is shorter than you. Protect yourself by wearing long sleeves, pants, a wide-brimmed hat, and sunglasses year round whenever you are outdoors.  Tell your health care provider of new moles or changes in moles, especially if there is a change in shape or color. Also, tell your health care provider if a mole is larger than the size of a pencil eraser.  A one-time screening for abdominal aortic aneurysm (AAA) and surgical repair of large AAAs by ultrasound is recommended for men aged 70-75 years who are current or former smokers.  Stay current with your vaccines (immunizations).   This information is not intended to replace advice given to you by your health care provider. Make sure you discuss any questions you have with your health care provider.   Document Released: 06/24/2007 Document Revised: 01/16/2014 Document Reviewed: 05/23/2010 Elsevier Interactive Patient Education [employer].

## 2015-10-07 NOTE — Assessment & Plan Note (Signed)
Preventative protocols reviewed and updated unless pt declined. Discussed healthy diet and lifestyle.  

## 2015-10-07 NOTE — Addendum Note (Signed)
Addended by: Shon MilletWATLINGTON, Yumiko Alkins M on: 10/07/2015 01:43 PM   Modules accepted: Orders

## 2015-10-07 NOTE — Assessment & Plan Note (Signed)
H/o this - update A1c.

## 2015-10-07 NOTE — Progress Notes (Signed)
Pre visit review using our clinic review tool, if applicable. No additional management support is needed unless otherwise documented below in the visit note. 

## 2015-10-07 NOTE — Assessment & Plan Note (Signed)
Check UA today. Anticipate due to increase water intake. Check labs tomorrow.

## 2015-10-07 NOTE — Progress Notes (Signed)
BP 140/90   Pulse 80   Temp 98.1 F (36.7 C) (Oral)   Ht 5\' 9"  (1.753 m)   Wt 189 lb 8 oz (86 kg)   BMI 27.98 kg/m    CC: CPE Subjective:    Patient ID: Gary Long, male    DOB: Jun 12, 1970, 45 y.o.   MRN: [id number]  HPI: Gary Long is a 45 y.o. 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   Preventative: No fmhx prostate or colon cancer.  Flu shot - today Tetanus - 03/2012 Pneumovax 03/2012 Seat belt use discussed Sunscreen use discussed. No changing moles on skin. Non smoker Alcohol - rare  Caffeine: occasionally From [place] Lives with wife and 3 daughters Edu: LPN works at [employer]: gym Diet: good water, fruits/vegetables daily  Relevant past medical, surgical, family and social history reviewed and updated as indicated. Interim medical history since our last visit reviewed. Allergies and medications reviewed and updated. No current outpatient prescriptions on file prior to visit.   No current facility-administered medications on file prior to visit.     Review of Systems  Constitutional: Negative for activity change, appetite change, chills, fatigue, fever and unexpected weight change.  HENT: Negative for hearing loss.   Eyes: Negative for visual disturbance.  Respiratory: Negative for cough, chest tightness, shortness of breath and wheezing.   Cardiovascular: Negative for chest pain, palpitations and leg swelling.  Gastrointestinal: Negative for abdominal distention, abdominal pain, blood in stool, constipation, diarrhea, nausea and vomiting.  Genitourinary: Negative for difficulty urinating and hematuria.  Musculoskeletal: Negative for arthralgias, myalgias and neck pain.  Skin: Negative for rash.  Neurological: Negative for dizziness, seizures, syncope and headaches.  Hematological: Negative for  adenopathy. Does not bruise/bleed easily.  Psychiatric/Behavioral: Negative for dysphoric mood. The patient is not nervous/anxious.    Per HPI unless specifically indicated in ROS section     Objective:    BP 140/90   Pulse 80   Temp 98.1 F (36.7 C) (Oral)   Ht 5\' 9"  (1.753 m)   Wt 189 lb 8 oz (86 kg)   BMI 27.98 kg/m   Wt Readings from Last 3 Encounters:  10/07/15 189 lb 8 oz (86 kg)  03/03/15 175 lb (79.4 kg)  07/05/14 175 lb (79.4 kg)    Physical Exam  Constitutional: He is oriented to person, place, and time. He appears well-developed and well-nourished. No distress.  HENT:  Head: Normocephalic and atraumatic.  Right Ear: Hearing, tympanic membrane, external ear and ear canal normal.  Left Ear: Hearing, tympanic membrane, external ear and ear canal normal.  Nose: Nose normal.  Mouth/Throat: Uvula is midline, oropharynx is clear and moist and mucous membranes are normal. No oropharyngeal exudate, posterior oropharyngeal edema or posterior oropharyngeal erythema.  Eyes: Conjunctivae and EOM are normal. Pupils are equal, round, and reactive to light. No scleral icterus.  Neck: Normal range of motion. Neck supple. No thyromegaly present.  Cardiovascular: Normal rate, regular rhythm, normal heart sounds and intact distal pulses.   No murmur heard. Pulses:      Radial pulses are 2+ on the right side, and 2+ on the left side.  Pulmonary/Chest: Effort normal and breath sounds normal. No respiratory distress. He has no wheezes. He has no rales.  Abdominal: Soft. Bowel sounds are normal. He exhibits no distension and no mass. There is no  tenderness. There is no rebound and no guarding.  Musculoskeletal: Normal range of motion. He exhibits no edema.  Lymphadenopathy:    He has no cervical adenopathy.  Neurological: He is alert and oriented to person, place, and time.  CN grossly intact, station and gait intact  Skin: Skin is warm and dry. No rash noted.  Psychiatric: He has a  normal mood and affect. His behavior is normal. Judgment and thought content normal.  Nursing note and vitals reviewed.  Results for orders placed or performed during the hospital encounter of 03/04/15  Basic metabolic panel  Result Value Ref Range   Sodium 139 135 - 145 mmol/L   Potassium 4.0 3.5 - 5.1 mmol/L   Chloride 104 101 - 111 mmol/L   CO2 24 22 - 32 mmol/L   Glucose, Bld 116 (H) 65 - 99 mg/dL   BUN 16 6 - 20 mg/dL   Creatinine, Ser 1.611.16 0.61 - 1.24 mg/dL   Calcium 9.2 8.9 - 09.610.3 mg/dL   GFR calc non Af Amer >60 >60 mL/min   GFR calc Af Amer >60 >60 mL/min   Anion gap 11 5 - 15  CBC  Result Value Ref Range   WBC 4.9 4.0 - 10.5 K/uL   RBC 4.69 4.22 - 5.81 MIL/uL   Hemoglobin 13.2 13.0 - 17.0 g/dL   HCT 04.540.8 40.939.0 - 81.152.0 %   MCV 87.0 78.0 - 100.0 fL   MCH 28.1 26.0 - 34.0 pg   MCHC 32.4 30.0 - 36.0 g/dL   RDW 91.412.9 78.211.5 - 95.615.5 %   Platelets 144 (L) 150 - 400 K/uL  I-stat troponin, ED [facility])  Result Value Ref Range   Troponin i, poc 0.00 0.00 - 0.08 ng/mL   Comment 3              Assessment & Plan:   Problem List Items Addressed This Visit    Fatigue   Relevant Orders   TSH   Frequent urination    Check UA today. Anticipate due to increase water intake. Check labs tomorrow.       Health maintenance examination - Primary    Preventative protocols reviewed and updated unless pt declined. Discussed healthy diet and lifestyle.       HLD (hyperlipidemia)    Update FLP when returns fasting tomorrow.       Relevant Orders   Lipid panel   Basic metabolic panel   Hyperglycemia    H/o this - update A1c.      Relevant Orders   Hemoglobin A1c    Other Visit Diagnoses    Need for influenza vaccination       Relevant Orders   Flu Vaccine QUAD 36+ mos PF IM (Fluarix & Fluzone Quad PF) (Completed)       Follow up plan: Return in about 1 year (around 10/06/2016), or as needed, for annual exam, prior fasting for blood work.  Eustaquio BoydenJavier Vaanya Shambaugh, MD

## 2015-10-08 ENCOUNTER — Other Ambulatory Visit (INDEPENDENT_AMBULATORY_CARE_PROVIDER_SITE_OTHER): Payer: BLUE CROSS/BLUE SHIELD

## 2015-10-08 ENCOUNTER — Encounter: Payer: Self-pay | Admitting: Family Medicine

## 2015-10-08 DIAGNOSIS — E785 Hyperlipidemia, unspecified: Secondary | ICD-10-CM | POA: Diagnosis not present

## 2015-10-08 DIAGNOSIS — R739 Hyperglycemia, unspecified: Secondary | ICD-10-CM | POA: Diagnosis not present

## 2015-10-08 DIAGNOSIS — R5382 Chronic fatigue, unspecified: Secondary | ICD-10-CM

## 2015-10-08 LAB — BASIC METABOLIC PANEL
BUN: 8 mg/dL (ref 6–23)
CO2: 31 mEq/L (ref 19–32)
CREATININE: 1.09 mg/dL (ref 0.40–1.50)
Calcium: 9.1 mg/dL (ref 8.4–10.5)
Chloride: 102 mEq/L (ref 96–112)
GFR: 94 mL/min (ref >60.00)
Glucose, Bld: 117 mg/dL — ABNORMAL HIGH (ref 70–99)
Potassium: 3.9 mEq/L (ref 3.5–5.1)
Sodium: 139 mEq/L (ref 135–145)

## 2015-10-08 LAB — LIPID PANEL
CHOL/HDL RATIO: 4
Cholesterol: 246 mg/dL — ABNORMAL HIGH (ref 0–200)
HDL: 59.2 mg/dL (ref >39.00)
LDL CALC: 159 mg/dL — AB (ref 0–99)
NonHDL: 187.16
Triglycerides: 143 mg/dL (ref 0.0–149.0)
VLDL: 28.6 mg/dL (ref 0.0–40.0)

## 2015-10-08 LAB — HEMOGLOBIN A1C: Hgb A1c MFr Bld: 6.5 % (ref 4.6–6.5)

## 2015-10-08 LAB — TSH: TSH: 1.16 u[IU]/mL (ref 0.35–4.50)

## 2015-10-15 ENCOUNTER — Encounter: Payer: Self-pay | Admitting: *Deleted

## 2015-11-29 ENCOUNTER — Encounter: Payer: Self-pay | Admitting: *Deleted

## 2015-12-13 ENCOUNTER — Encounter: Payer: Self-pay | Admitting: Emergency Medicine

## 2015-12-13 DIAGNOSIS — K5900 Constipation, unspecified: Secondary | ICD-10-CM | POA: Insufficient documentation

## 2015-12-13 NOTE — ED Triage Notes (Signed)
Pt ambulatory to triage with steady gait with c/o mid abd pain and generalized weakness for the past 4 days. Pt reports abd pain subsided yesterday but returned tonight. Pt denies urinary symptoms, n/v/d, or chest pain.

## 2015-12-14 ENCOUNTER — Emergency Department: Payer: BLUE CROSS/BLUE SHIELD

## 2015-12-14 ENCOUNTER — Emergency Department
Admission: EM | Admit: 2015-12-14 | Discharge: 2015-12-14 | Disposition: A | Discharge status: Home / Self Care | Payer: BLUE CROSS/BLUE SHIELD | Attending: Emergency Medicine | Admitting: Emergency Medicine

## 2015-12-14 DIAGNOSIS — K5900 Constipation, unspecified: Secondary | ICD-10-CM

## 2015-12-14 DIAGNOSIS — R1033 Periumbilical pain: Secondary | ICD-10-CM

## 2015-12-14 LAB — CBC
HCT: 43.7 % (ref 40.0–52.0)
Hemoglobin: 14.6 g/dL (ref 13.0–18.0)
MCH: 28.5 pg (ref 26.0–34.0)
MCHC: 33.4 g/dL (ref 32.0–36.0)
MCV: 85.3 fL (ref 80.0–100.0)
PLATELETS: 127 10*3/uL — AB (ref 150–440)
RBC: 5.12 MIL/uL (ref 4.40–5.90)
RDW: 13.2 % (ref 11.5–14.5)
WBC: 5.3 10*3/uL (ref 3.8–10.6)

## 2015-12-14 LAB — URINALYSIS COMPLETE WITH MICROSCOPIC (ARMC ONLY)
BILIRUBIN URINE: NEGATIVE
Bacteria, UA: NONE SEEN
GLUCOSE, UA: NEGATIVE mg/dL
Hgb urine dipstick: NEGATIVE
KETONES UR: NEGATIVE mg/dL
Leukocytes, UA: NEGATIVE
Nitrite: NEGATIVE
PH: 8 (ref 5.0–8.0)
Protein, ur: NEGATIVE mg/dL
RBC / HPF: NONE SEEN RBC/hpf (ref 0–5)
Specific Gravity, Urine: 1.017 (ref 1.005–1.030)
Squamous Epithelial / LPF: NONE SEEN

## 2015-12-14 LAB — COMPREHENSIVE METABOLIC PANEL
ALK PHOS: 63 U/L (ref 38–126)
ALT: 26 U/L (ref 17–63)
AST: 23 U/L (ref 15–41)
Albumin: 3.9 g/dL (ref 3.5–5.0)
Anion gap: 7 (ref 5–15)
BUN: 15 mg/dL (ref 6–20)
CALCIUM: 9.3 mg/dL (ref 8.9–10.3)
CO2: 30 mmol/L (ref 22–32)
CREATININE: 1.2 mg/dL (ref 0.61–1.24)
Chloride: 101 mmol/L (ref 101–111)
Glucose, Bld: 126 mg/dL — ABNORMAL HIGH (ref 65–99)
Potassium: 4.1 mmol/L (ref 3.5–5.1)
Sodium: 138 mmol/L (ref 135–145)
Total Bilirubin: 0.6 mg/dL (ref 0.3–1.2)
Total Protein: 7.2 g/dL (ref 6.5–8.1)

## 2015-12-14 LAB — TROPONIN I: Troponin I: <0.03 ng/mL (ref <0.03)

## 2015-12-14 LAB — LIPASE, BLOOD: Lipase: 37 U/L (ref 11–51)

## 2015-12-14 MED ORDER — ONDANSETRON 4 MG PO TBDP
ORAL_TABLET | ORAL | Status: AC
  Administered 2015-12-14: 4 mg via ORAL
  Filled 2015-12-14: qty 1

## 2015-12-14 MED ORDER — ONDANSETRON 4 MG PO TBDP
4.0000 mg | ORAL_TABLET | Freq: Once | ORAL | Status: AC
  Administered 2015-12-14: 4 mg via ORAL

## 2015-12-14 MED ORDER — LACTULOSE 10 GM/15ML PO SOLN: 20.0000 g | Freq: Every day | ORAL | 0 refills | Status: DC | PRN

## 2015-12-14 MED ORDER — IOPAMIDOL (ISOVUE-300) INJECTION 61%
100.0000 mL | Freq: Once | INTRAVENOUS | Status: AC | PRN
  Administered 2015-12-14: 100 mL via INTRAVENOUS

## 2015-12-14 MED ORDER — SODIUM CHLORIDE 0.9 % IV BOLUS (SEPSIS)
1000.0000 mL | Freq: Once | INTRAVENOUS | Status: AC
  Administered 2015-12-14: 1000 mL via INTRAVENOUS

## 2015-12-14 MED ORDER — IOPAMIDOL (ISOVUE-300) INJECTION 61%
30.0000 mL | Freq: Once | INTRAVENOUS | Status: AC | PRN
  Administered 2015-12-14: 30 mL via ORAL

## 2015-12-14 MED ORDER — DICYCLOMINE HCL 20 MG PO TABS
20.0000 mg | ORAL_TABLET | Freq: Four times a day (QID) | ORAL | 0 refills | Status: DC | PRN
Start: 2015-12-14 — End: 2016-04-20

## 2015-12-14 MED ORDER — ONDANSETRON HCL 4 MG/2ML IJ SOLN
4.0000 mg | Freq: Once | INTRAMUSCULAR | Status: AC
  Administered 2015-12-14: 4 mg via INTRAVENOUS
  Filled 2015-12-14: qty 2

## 2015-12-14 NOTE — ED Notes (Signed)
Pt discharged to home.  Family member driving.  Discharge instructions reviewed.  Verbalized understanding.  No questions or concerns at this time.  Teach back verified.  Pt in NAD.  No items left in ED.   

## 2015-12-14 NOTE — ED Provider Notes (Signed)
[facility] Emergency Department Provider Note   ____________________________________________   First MD Initiated Contact with Patient 12/14/15 0154     (approximate)  I have reviewed the triage vital signs and the nursing notes.   HISTORY  Chief Complaint Abdominal Pain    HPI Gary Long is a 45 y.o. male who presents to the ED from home with a chief complaint of abdominal pain. Patient reports mid abdominal pain and generalized malaise for the past 4 days. States abdominal pain subsided yesterday but returned tonight. Symptoms are not associated with fever, chills, nausea, vomiting, diarrhea, dysuria. Last bowel movement yesterday which was firm. Denies chest pain or shortness of breath. Denies recent travel or trauma. Nothing makes his symptoms better or worse.   Past Medical History:  Diagnosis Date  . History of malaria 1999  . Hyperlipidemia     Patient Active Problem List   Diagnosis Date Noted  . Health maintenance examination 11/27/2013  . Hyperglycemia 11/27/2013  . Frequent urination 11/27/2013  . Premature ejaculation 11/27/2013  . Left knee pain 11/27/2013  . HLD (hyperlipidemia) 06/13/2013  . Fatigue 12/10/2012    Past Surgical History:  Procedure Laterality Date  . KNEE ARTHROSCOPY Right 2002   ?ACL repair    Prior to Admission medications   Not on File    Allergies Chloroquine; Hydrocodone; and Percocet [oxycodone-acetaminophen]  Family History  Problem Relation Age of Onset  . Stroke Mother   . Diabetes Mother   . Hypertension Sister   . Cancer Neg Hx   . CAD Neg Hx     Social History Social History  Substance Use Topics  . Smoking status: Never Smoker  . Smokeless tobacco: Never Used  . Alcohol use No    Review of Systems  Constitutional: No fever/chills. Eyes: No visual changes. ENT: No sore throat. Cardiovascular: Denies chest pain. Respiratory: Denies shortness of  breath. Gastrointestinal: Positive for abdominal pain.  No nausea, no vomiting.  No diarrhea.  No constipation. Genitourinary: Negative for dysuria. Musculoskeletal: Negative for back pain. Skin: Negative for rash. Neurological: Negative for headaches, focal weakness or numbness.  10-point ROS otherwise negative.  ____________________________________________   PHYSICAL EXAM:  VITAL SIGNS: ED Triage Vitals [12/13/15 2348]  Enc Vitals Group     BP 127/78     Pulse Rate 62     Resp 18     Temp 97.9 F (36.6 C)     Temp Source Oral     SpO2 97 %     Weight 180 lb (81.6 kg)     Height 5\' 9"  (1.753 m)     Head Circumference      Peak Flow      Pain Score 10     Pain Loc      Pain Edu?      Excl. in GC?     Constitutional: Asleep; awakens for exam. Alert and oriented. Well appearing and in no acute distress. Eyes: Conjunctivae are normal. PERRL. EOMI. Head: Atraumatic. Nose: No congestion/rhinnorhea. Mouth/Throat: Mucous membranes are moist.  Oropharynx non-erythematous. Neck: No stridor.   Cardiovascular: Normal rate, regular rhythm. Grossly normal heart sounds.  Good peripheral circulation. Respiratory: Normal respiratory effort.  No retractions. Lungs CTAB. Gastrointestinal: Soft and mildly tender to palpation umbilicus without rebound or guarding. No distention. No abdominal bruits. No CVA tenderness. Musculoskeletal: No lower extremity tenderness nor edema.  No joint effusions. Neurologic:  Normal speech and language. No gross focal neurologic deficits are appreciated.  No gait instability. Skin:  Skin is warm, dry and intact. No rash noted. Psychiatric: Mood and affect are normal. Speech and behavior are normal.  ____________________________________________   LABS (all labs ordered are listed, but only abnormal results are displayed)  Labs Reviewed  COMPREHENSIVE METABOLIC PANEL - Abnormal; Notable for the following:       Result Value   Glucose, Bld 126 (*)     All other components within normal limits  CBC - Abnormal; Notable for the following:    Platelets 127 (*)    All other components within normal limits  URINALYSIS COMPLETEWITH MICROSCOPIC ([facility] ONLY) - Abnormal; Notable for the following:    Color, Urine YELLOW (*)    APPearance CLEAR (*)    All other components within normal limits  LIPASE, BLOOD  TROPONIN I   ____________________________________________  EKG  ED ECG REPORT I, Darris Staiger J, the attending physician, personally viewed and interpreted this ECG.   Date: 12/14/2015  EKG Time: 2354  Rate: 64  Rhythm: normal EKG, normal sinus rhythm  Axis: Normal  Intervals:first-degree A-V block   ST&T Change: Nonspecific  ____________________________________________  RADIOLOGY  CT abdomen and pelvis with contrast interpreted per Dr. Karie KirksBloomer: No acute intra-abdominal or pelvic process.    Hepatic steatosis.   ____________________________________________   PROCEDURES  Procedure(s) performed: None  Procedures  Critical Care performed: No  ____________________________________________   INITIAL IMPRESSION / ASSESSMENT AND PLAN / ED COURSE  Pertinent labs & imaging results that were available during my care of the patient were reviewed by me and considered in my medical decision making (see chart for details).  45 year old male sent in with mid abdominal pain 4 days. He is afebrile with normal white count; rest of laboratory and urinalysis results are unremarkable. Will administer IV analgesia, antiemetic and proceed with CT abdomen/pelvis to evaluate for intra-abdominal etiology.  Clinical Course as of Dec 14 354  Tue Dec 14, 2015  [id number] Patient sleeping in no acute distress. Awake and to update him of CT imaging results. IV these results personally; there is moderate stool burden. Will place patient on lactulose and Bentyl, and patient to follow-up with his PCP. Strict return precautions given. Patient verbalizes  understanding and agrees with plan of care.  [JS]    Clinical Course User Index [JS] Irean HongJade J Jerimie Mancuso, MD     ____________________________________________   FINAL CLINICAL IMPRESSION(S) / ED DIAGNOSES  Final diagnoses:  Periumbilical abdominal pain  Constipation, unspecified constipation type      NEW MEDICATIONS STARTED DURING THIS VISIT:  New Prescriptions   No medications on file     Note:  This document was prepared using Dragon voice recognition software and may include unintentional dictation errors.    Irean HongJade J Anallely Rosell, MD 12/14/15 [phone]

## 2015-12-14 NOTE — Discharge Instructions (Signed)
1. You may take lactulose as needed to have bowel movements. 2. You may take Bentyl as needed for abdominal discomfort. 3. Drink plenty of fluids daily. 4. Return to the ER for worsening symptoms, persistent vomiting, fever or other concerns.

## 2015-12-30 ENCOUNTER — Emergency Department
Admission: EM | Admit: 2015-12-30 | Discharge: 2015-12-30 | Disposition: A | Discharge status: Home / Self Care | Payer: BLUE CROSS/BLUE SHIELD | Attending: Emergency Medicine | Admitting: Emergency Medicine

## 2015-12-30 ENCOUNTER — Encounter: Payer: Self-pay | Admitting: Emergency Medicine

## 2015-12-30 DIAGNOSIS — T471X1A Poisoning by other antacids and anti-gastric-secretion drugs, accidental (unintentional), initial encounter: Secondary | ICD-10-CM | POA: Insufficient documentation

## 2015-12-30 DIAGNOSIS — B349 Viral infection, unspecified: Secondary | ICD-10-CM | POA: Insufficient documentation

## 2015-12-30 DIAGNOSIS — Z79899 Other long term (current) drug therapy: Secondary | ICD-10-CM | POA: Insufficient documentation

## 2015-12-30 DIAGNOSIS — T50901A Poisoning by unspecified drugs, medicaments and biological substances, accidental (unintentional), initial encounter: Secondary | ICD-10-CM

## 2015-12-30 LAB — INFLUENZA PANEL BY PCR (TYPE A & B)
Influenza A By PCR: NEGATIVE
Influenza B By PCR: NEGATIVE

## 2015-12-30 MED ORDER — DIPHENHYDRAMINE HCL 50 MG/ML IJ SOLN
12.5000 mg | Freq: Once | INTRAMUSCULAR | Status: AC
  Administered 2015-12-30: 12.5 mg via INTRAVENOUS
  Filled 2015-12-30: qty 1

## 2015-12-30 MED ORDER — SODIUM CHLORIDE 0.9 % IV BOLUS (SEPSIS)
1000.0000 mL | Freq: Once | INTRAVENOUS | Status: AC
  Administered 2015-12-30: 1000 mL via INTRAVENOUS

## 2015-12-30 MED ORDER — IBUPROFEN 800 MG PO TABS
800.0000 mg | ORAL_TABLET | Freq: Once | ORAL | Status: AC
  Administered 2015-12-30: 800 mg via ORAL
  Filled 2015-12-30: qty 1

## 2015-12-30 NOTE — ED Provider Notes (Signed)
[facility] Emergency Department Provider Note   ____________________________________________   First MD Initiated Contact with Patient 12/30/15 [id number]     (approximate)  I have reviewed the triage vital signs and the nursing notes.   HISTORY  Chief Complaint Ingestion and Generalized Body Aches    HPI Gary Long is a 45 y.o. male who presents to the ED from home with a chief complaint of possible accidental medication overdose. Patient developed flulike symptoms yesterday morning and states he took the daytime dose of Alka-Seltzer around 8:30 AM and the nighttime dose of Alka-Seltzer at 12 PM. Family member states patient took 4 doses over the course of the day and she is concerned that he accidentally took too much medication and a short period of time. Patient reports last evening he had severe global headache, jaw pain and facial clenching. Symptoms are improved without intervention. Denies associated fever, chills, vision changes, neck pain, chest pain, shortness of breath, abdominal pain, nausea, vomiting, diarrhea. Denies recent travel or trauma. Nothing makes his symptoms better or worse.   Past Medical History:  Diagnosis Date  . History of malaria 1999  . Hyperlipidemia     Patient Active Problem List   Diagnosis Date Noted  . Health maintenance examination 11/27/2013  . Hyperglycemia 11/27/2013  . Frequent urination 11/27/2013  . Premature ejaculation 11/27/2013  . Left knee pain 11/27/2013  . HLD (hyperlipidemia) 06/13/2013  . Fatigue 12/10/2012    Past Surgical History:  Procedure Laterality Date  . KNEE ARTHROSCOPY Right 2002   ?ACL repair    Prior to Admission medications   Medication Sig Start Date End Date Taking? Authorizing Provider  dicyclomine (BENTYL) 20 MG tablet Take 1 tablet (20 mg total) by mouth every 6 (six) hours as needed. 12/14/15   Irean HongJade J Cline Draheim, MD  lactulose (CHRONULAC) 10 GM/15ML solution Take 30 mLs  (20 g total) by mouth daily as needed for mild constipation. 12/14/15   Irean HongJade J Espiridion Supinski, MD    Allergies Chloroquine; Hydrocodone; and Percocet [oxycodone-acetaminophen]  Family History  Problem Relation Age of Onset  . Stroke Mother   . Diabetes Mother   . Hypertension Sister   . Cancer Neg Hx   . CAD Neg Hx     Social History Social History  Substance Use Topics  . Smoking status: Never Smoker  . Smokeless tobacco: Never Used  . Alcohol use No    Review of Systems  Constitutional: Positive for body aches. No fever/chills. Eyes: No visual changes. ENT: Positive for jaw pain and facial clenching. No sore throat. Cardiovascular: Denies chest pain. Respiratory: Denies shortness of breath. Gastrointestinal: No abdominal pain.  No nausea, no vomiting.  No diarrhea.  No constipation. Genitourinary: Negative for dysuria. Musculoskeletal: Negative for back pain. Skin: Negative for rash. Neurological: Positive for headache. Negative for focal weakness or numbness.  10-point ROS otherwise negative.  ____________________________________________   PHYSICAL EXAM:  VITAL SIGNS: ED Triage Vitals  Enc Vitals Group     BP 12/30/15 0257 118/74     Pulse Rate 12/30/15 0257 97     Resp 12/30/15 0257 20     Temp 12/30/15 0257 99.1 F (37.3 C)     Temp Source 12/30/15 0257 Oral     SpO2 12/30/15 0257 97 %     Weight 12/30/15 0258 180 lb (81.6 kg)     Height 12/30/15 0258 5\' 9"  (1.753 m)     Head Circumference --  Peak Flow --      Pain Score 12/30/15 0258 9     Pain Loc --      Pain Edu? --      Excl. in GC? --     Constitutional: Alert and oriented. Well appearing and in no acute distress. Eyes: Conjunctivae are normal. PERRL. EOMI. Head: Atraumatic. Nose: Congestion/rhinnorhea. Mouth/Throat: No trismus. Mucous membranes are moist.  Oropharynx mildly erythematous without tonsillar swelling, exudates or peritonsillar abscess.  There is no hoarse or muffled voice.  There  is no drooling. Neck: No stridor.  Supple neck without meningismus. Cardiovascular: Normal rate, regular rhythm. Grossly normal heart sounds.  Good peripheral circulation. Respiratory: Normal respiratory effort.  No retractions. Lungs CTAB. Gastrointestinal: Soft and nontender. No distention. No abdominal bruits. No CVA tenderness. Musculoskeletal: No lower extremity tenderness nor edema.  No joint effusions. Neurologic:  Normal speech and language. No gross focal neurologic deficits are appreciated. No gait instability. Skin:  Skin is warm, dry and intact. No rash noted. No petechiae. Psychiatric: Mood and affect are normal. Speech and behavior are normal.  ____________________________________________   LABS (all labs ordered are listed, but only abnormal results are displayed)  Labs Reviewed  INFLUENZA PANEL BY PCR (TYPE A & B, H1N1)   ____________________________________________  EKG  None ____________________________________________  RADIOLOGY  None ____________________________________________   PROCEDURES  Procedure(s) performed: None  Procedures  Critical Care performed: No  ____________________________________________   INITIAL IMPRESSION / ASSESSMENT AND PLAN / ED COURSE  Pertinent labs & imaging results that were available during my care of the patient were reviewed by me and considered in my medical decision making (see chart for details).  45 year old male who presents with accidental overdose of Alka-Seltzer. Active ingredients include dextromethorphan, phenylephrine and doxylamine. No suspicion for intentional or toxic overdose. More likely patient is having mild side effects from the dextromethorphan. We will infuse IV fluids and administer Benadryl.  Clinical Course as of Dec 30 755  Thu Dec 30, 2015  [id number] Patient feeling better. Strict return precautions given. Patient verbalizes understanding and agrees with plan of care.  [JS]    Clinical Course  User Index [JS] Irean HongJade J Tsuneo Faison, MD     ____________________________________________   FINAL CLINICAL IMPRESSION(S) / ED DIAGNOSES  Final diagnoses:  Viral syndrome  Accidental drug overdose, initial encounter      NEW MEDICATIONS STARTED DURING THIS VISIT:  New Prescriptions   No medications on file     Note:  This document was prepared using Dragon voice recognition software and may include unintentional dictation errors.    Irean HongJade J Erubiel Manasco, MD 12/30/15 [phone]

## 2015-12-30 NOTE — Discharge Instructions (Signed)
1. Drink plenty of fluids daily. 2. You may take Benadryl as needed for relief of symptoms. 3. Avoid taking more Alka-Seltzer. 4. Return to the ER for worsening symptoms, persistent vomiting, difficult breathing or other concerns.

## 2015-12-30 NOTE — ED Triage Notes (Signed)
Pt presents to ED with possible accidental medication overdose. Pt states he had flu-like symptoms starting earlier in the day and took the "day time" dose of alka-seltzer around 830am and the "night time" dose of Alka-seltzer at 1200pm. Pt c/o severe headache, difficulty speaking, jaw pain, and facial pain. Pt family member states she is concerned that he may have overdosed and that his symptoms are a result of too much medication in a short period of time. Pt currently has no increased work of breathing or acute distress noted. Speaking very softly and difficult to understand when answering questions.

## 2016-04-20 ENCOUNTER — Ambulatory Visit (INDEPENDENT_AMBULATORY_CARE_PROVIDER_SITE_OTHER): Payer: Self-pay | Admitting: Family Medicine

## 2016-04-20 ENCOUNTER — Ambulatory Visit (INDEPENDENT_AMBULATORY_CARE_PROVIDER_SITE_OTHER)
Admission: RE | Admit: 2016-04-20 | Discharge: 2016-04-20 | Disposition: A | Discharge status: Home / Self Care | Payer: Self-pay | Source: Ambulatory Visit | Attending: Family Medicine | Admitting: Family Medicine

## 2016-04-20 ENCOUNTER — Encounter: Payer: Self-pay | Admitting: Family Medicine

## 2016-04-20 VITALS — BP 124/90 | HR 76 | Temp 98.2°F | Wt 191.8 lb

## 2016-04-20 DIAGNOSIS — M25561 Pain in right knee: Secondary | ICD-10-CM

## 2016-04-20 DIAGNOSIS — E119 Type 2 diabetes mellitus without complications: Secondary | ICD-10-CM

## 2016-04-20 DIAGNOSIS — G8929 Other chronic pain: Secondary | ICD-10-CM

## 2016-04-20 NOTE — Progress Notes (Signed)
Pre visit review using our clinic review tool, if applicable. No additional management support is needed unless otherwise documented below in the visit note. 

## 2016-04-20 NOTE — Progress Notes (Signed)
BP 124/90   Pulse 76   Temp 98.2 F (36.8 C) (Oral)   Wt 191 lb 12 oz (87 kg)   BMI 28.32 kg/m    CC: knee pain Subjective:    Patient ID: Gary Long, male    DOB: 1970/07/16, 46 y.o.   MRN: [id number]  HPI: Gary Long is a 46 y.o. male presenting on 04/20/2016 for Knee Pain (right; no recalled injury) and Follow-up (labs)   Ongoing chronic R knee pain that started 2002 after knee arthroscopy for ?ACL repair [facility] - records not available). Saw Dr Johnsie Cancel sports medicine latest 05/2015 s/p knee injections x2 with temporary relief. Denies recent falls or injuries. Knee continues to feel weak, painful especially with twisting motions. Points to lateral knee. Occasional locking in place. + instability but has not fallen. Denies redness, swelling, warmth of knee. He has had several MRIs latest 2011 in [place]. He has also had NCS. Told all normal.   Also - labs done 09/2015 showed A1c 6.5% - difficulty reaching patient with results. Here to discuss this.   Relevant past medical, surgical, family and social history reviewed and updated as indicated. Interim medical history since our last visit reviewed. Allergies and medications reviewed and updated. Outpatient Medications Prior to Visit  Medication Sig Dispense Refill  . dicyclomine (BENTYL) 20 MG tablet Take 1 tablet (20 mg total) by mouth every 6 (six) hours as needed. (Patient not taking: Reported on 04/20/2016) 20 tablet 0  . lactulose (CHRONULAC) 10 GM/15ML solution Take 30 mLs (20 g total) by mouth daily as needed for mild constipation. (Patient not taking: Reported on 04/20/2016) 120 mL 0   No facility-administered medications prior to visit.      Per HPI unless specifically indicated in ROS section below Review of Systems     Objective:    BP 124/90   Pulse 76   Temp 98.2 F (36.8 C) (Oral)   Wt 191 lb 12 oz (87 kg)   BMI 28.32 kg/m   Wt Readings from Last 3 Encounters:  04/20/16 191 lb 12 oz  (87 kg)  12/30/15 180 lb (81.6 kg)  12/13/15 180 lb (81.6 kg)    Physical Exam  Constitutional: He appears well-developed and well-nourished. No distress.  HENT:  Mouth/Throat: Oropharynx is clear and moist. No oropharyngeal exudate.  Cardiovascular: Normal rate, regular rhythm, normal heart sounds and intact distal pulses.   No murmur heard. Pulmonary/Chest: Effort normal and breath sounds normal. No respiratory distress. He has no wheezes. He has no rales.  Musculoskeletal: He exhibits no edema.  L knee WNL R Knee exam: No deformity on inspection. Mild discomfort along lateral joint line.  No effusion/swelling noted.  FROM in flex/extension with crepitus.  No popliteal fullness. Neg drawer test. ++ mcmurray test. No pain with valgus/varus stress. Mild PFgrind. No abnormal patellar mobility.   Skin: Skin is warm and dry. No rash noted.  Psychiatric: He has a normal mood and affect.  Nursing note and vitals reviewed.  Diabetic Foot Exam - Simple   Simple Foot Form Diabetic Foot exam was performed with the following findings:  Yes 04/20/2016  6:59 PM  Visual Inspection No deformities, no ulcerations, no other skin breakdown bilaterally:  Yes Sensation Testing Intact to touch and monofilament testing bilaterally:  Yes Pulse Check Posterior Tibialis and Dorsalis pulse intact bilaterally:  Yes Comments        Assessment & Plan:  Labs from last fall reviewed with patient.  Problem List Items Addressed This Visit    Diabetes mellitus type 2, controlled, without complications (HCC)    Reviewed relatively new diagnosis - discussed management. Update A1c today, check microalbumin. Encouraged low carb, low sugar diet. Offered diabetes education. rec schedule eye exam.  RTC 6 mo CPE.       Relevant Orders   Microalbumin / creatinine urine ratio   Hemoglobin A1c   Right knee pain - Primary    Anticipate chronic meniscal tear/injury.  rec elevation, sleeve compression  brace Check baseline xrays today and then consider MRI and ortho referral for further eval/treatment.       Relevant Orders   DG Knee 4 Views W/Patella Right       Follow up plan: Return in about 6 months (around 10/20/2016) for follow up visit.  Eustaquio Boyden, MD

## 2016-04-20 NOTE — Patient Instructions (Addendum)
Let's check A1c today.  Work on low sugar and low carb diet for new diabetes.  Return in 5-6 months for physical.  For right knee - xrays today. I am suspicious for meniscal tear or injury. We will call you with results and plan.  For now, buy knee sleeve for extra support, may use heating pad or ibuprofen as needed.

## 2016-04-20 NOTE — Assessment & Plan Note (Signed)
Reviewed relatively new diagnosis - discussed management. Update A1c today, check microalbumin. Encouraged low carb, low sugar diet. Offered diabetes education. rec schedule eye exam.  RTC 6 mo CPE.

## 2016-04-20 NOTE — Assessment & Plan Note (Signed)
Anticipate chronic meniscal tear/injury.  rec elevation, sleeve compression brace Check baseline xrays today and then consider MRI and ortho referral for further eval/treatment.

## 2016-04-21 LAB — HEMOGLOBIN A1C: HEMOGLOBIN A1C: 7 % — AB (ref 4.6–6.5)

## 2016-04-21 LAB — MICROALBUMIN / CREATININE URINE RATIO
CREATININE, U: 62.7 mg/dL
MICROALB/CREAT RATIO: 1.1 mg/g (ref 0.0–30.0)

## 2016-05-10 ENCOUNTER — Ambulatory Visit (INDEPENDENT_AMBULATORY_CARE_PROVIDER_SITE_OTHER): Payer: Self-pay | Admitting: Family Medicine

## 2016-05-10 ENCOUNTER — Encounter: Payer: Self-pay | Admitting: Family Medicine

## 2016-05-10 VITALS — BP 120/82 | HR 75 | Temp 98.0°F | Ht 69.0 in | Wt 186.0 lb

## 2016-05-10 DIAGNOSIS — R002 Palpitations: Secondary | ICD-10-CM

## 2016-05-10 DIAGNOSIS — E119 Type 2 diabetes mellitus without complications: Secondary | ICD-10-CM

## 2016-05-10 DIAGNOSIS — R740 Nonspecific elevation of levels of transaminase and lactic acid dehydrogenase [LDH]: Secondary | ICD-10-CM

## 2016-05-10 DIAGNOSIS — M25561 Pain in right knee: Secondary | ICD-10-CM

## 2016-05-10 DIAGNOSIS — G8929 Other chronic pain: Secondary | ICD-10-CM

## 2016-05-10 DIAGNOSIS — R7401 Elevation of levels of liver transaminase levels: Secondary | ICD-10-CM | POA: Insufficient documentation

## 2016-05-10 LAB — GAMMA GT: GGT: 57 U/L — ABNORMAL HIGH (ref 7–51)

## 2016-05-10 LAB — HEPATIC FUNCTION PANEL
ALK PHOS: 70 U/L (ref 39–117)
ALT: 27 U/L (ref 0–53)
AST: 23 U/L (ref 0–37)
Albumin: 4.2 g/dL (ref 3.5–5.2)
BILIRUBIN DIRECT: 0.1 mg/dL (ref 0.0–0.3)
TOTAL PROTEIN: 7.6 g/dL (ref 6.0–8.3)
Total Bilirubin: 0.4 mg/dL (ref 0.2–1.2)

## 2016-05-10 LAB — TSH: TSH: 2.12 u[IU]/mL (ref 0.35–4.50)

## 2016-05-10 NOTE — Progress Notes (Signed)
Pre visit review using our clinic review tool, if applicable. No additional management support is needed unless otherwise documented below in the visit note. 

## 2016-05-10 NOTE — Assessment & Plan Note (Addendum)
Reviewed [facility] ER records - he did have elevated ALT to 250s. Denies abd pain, jaundice, nausea/vomiting, tylenol use, denies other viral hepatitis sxs. No significant alcohol use. No rec drugs. Will recheck today, will add acute hepatitis panel. Pt agrees.

## 2016-05-10 NOTE — Assessment & Plan Note (Signed)
Isolated episode while at work, HR 150s s/p eval at [facility] ER with benign holter monitor. Advised if recurrent palpitations, will recommend cards referral. Pt agrees.

## 2016-05-10 NOTE — Patient Instructions (Addendum)
Let me know if recurrent palpitations or racing heart for referral to heart doctor.  We will order MRI of R knee - see Shirlee Limerick on your way out.  Continue healthy diet choices - low sugar, low carb diet - to help keep sugar levels under control.   Diabetes Mellitus and Food It is important for you to manage your blood sugar (glucose) level. Your blood glucose level can be greatly affected by what you eat. Eating healthier foods in the appropriate amounts throughout the day at about the same time each day will help you control your blood glucose level. It can also help slow or prevent worsening of your diabetes mellitus. Healthy eating may even help you improve the level of your blood pressure and reach or maintain a healthy weight. General recommendations for healthful eating and cooking habits include:  Eating meals and snacks regularly. Avoid going long periods of time without eating to lose weight.  Eating a diet that consists mainly of plant-based foods, such as fruits, vegetables, nuts, legumes, and whole grains.  Using low-heat cooking methods, such as baking, instead of high-heat cooking methods, such as deep frying. Work with your dietitian to make sure you understand how to use the Nutrition Facts information on food labels. How can food affect me? Carbohydrates  Carbohydrates affect your blood glucose level more than any other type of food. Your dietitian will help you determine how many carbohydrates to eat at each meal and teach you how to count carbohydrates. Counting carbohydrates is important to keep your blood glucose at a healthy level, especially if you are using insulin or taking certain medicines for diabetes mellitus. Alcohol  Alcohol can cause sudden decreases in blood glucose (hypoglycemia), especially if you use insulin or take certain medicines for diabetes mellitus. Hypoglycemia can be a life-threatening condition. Symptoms of hypoglycemia (sleepiness, dizziness, and  disorientation) are similar to symptoms of having too much alcohol. If your health care provider has given you approval to drink alcohol, do so in moderation and use the following guidelines:  Women should not have more than one drink per day, and men should not have more than two drinks per day. One drink is equal to:  12 oz of beer.  5 oz of wine.  1 oz of hard liquor.  Do not drink on an empty stomach.  Keep yourself hydrated. Have water, diet soda, or unsweetened iced tea.  Regular soda, juice, and other mixers might contain a lot of carbohydrates and should be counted. What foods are not recommended? As you make food choices, it is important to remember that all foods are not the same. Some foods have fewer nutrients per serving than other foods, even though they might have the same number of calories or carbohydrates. It is difficult to get your body what it needs when you eat foods with fewer nutrients. Examples of foods that you should avoid that are high in calories and carbohydrates but low in nutrients include:  Trans fats (most processed foods list trans fats on the Nutrition Facts label).  Regular soda.  Juice.  Candy.  Sweets, such as cake, pie, doughnuts, and cookies.  Fried foods. What foods can I eat? Eat nutrient-rich foods, which will nourish your body and keep you healthy. The food you should eat also will depend on several factors, including:  The calories you need.  The medicines you take.  Your weight.  Your blood glucose level.  Your blood pressure level.  Your cholesterol level. You  should eat a variety of foods, including:  Protein.  Lean cuts of meat.  Proteins low in saturated fats, such as fish, egg whites, and beans. Avoid processed meats.  Fruits and vegetables.  Fruits and vegetables that may help control blood glucose levels, such as apples, mangoes, and yams.  Dairy products.  Choose fat-free or low-fat dairy products, such  as milk, yogurt, and cheese.  Grains, bread, pasta, and rice.  Choose whole grain products, such as multigrain bread, whole oats, and brown rice. These foods may help control blood pressure.  Fats.  Foods containing healthful fats, such as nuts, avocado, olive oil, canola oil, and fish. Does everyone with diabetes mellitus have the same meal plan? Because every person with diabetes mellitus is different, there is not one meal plan that works for everyone. It is very important that you meet with a dietitian who will help you create a meal plan that is just right for you. This information is not intended to replace advice given to you by your health care provider. Make sure you discuss any questions you have with your health care provider. Document Released: 09/22/2004 Document Revised: 06/03/2015 Document Reviewed: 11/22/2012 Elsevier Interactive Patient Education  2017 [employer].

## 2016-05-10 NOTE — Assessment & Plan Note (Addendum)
Acute on chronic, worsening. See prior note for further details. Anticipate chronic meniscal tear with + mcmurray's test. xrays stable. Will order MRI to eval for ligament or meniscal injury and see if surgical intervention warranted.

## 2016-05-10 NOTE — Assessment & Plan Note (Signed)
Again reviewed diagnosis and discussed diabetic diet.  He has worked on [employer] changes and has lost 5 lbs in the process. Congratulated.  Reassess at CPE in 4-5 months.

## 2016-05-10 NOTE — Progress Notes (Signed)
BP 120/82   Pulse 75   Temp 98 F (36.7 C)   Ht  (1.753 m)   Wt 186 lb 0.1 oz (84.4 kg)   SpO2 99%   BMI 27.47 kg/m    CC: R knee pain Subjective:    Patient ID: Gary Long, male    DOB: 04-Jul-1970, 46 y.o.   MRN: [id number]  HPI: Gary Long is a 46 y.o. male presenting on 05/10/2016 for Knee Pain (right )   See prior note for details. Seen here earlier last month with chronic R knee pain concern for meniscal tear. Xray did not show acute changes. Requests MRI today. He has started using knee sleeve.   Seen at [facility] ER with palpitations to 154 at rest. ER records reviewed. Holter monitor placed with benign results per patient. ALT 254, AST 97, CK 411. CE normal x2, CBC WNL. Drinks 1-2 cups coffee at work.  Lab Results  Component Value Date   ALT 26 12/14/2015   AST 23 12/14/2015   ALKPHOS 63 12/14/2015   BILITOT 0.6 12/14/2015     DM - working on low carb low sugar diet. Weight down 5 lbs.  Lab Results  Component Value Date   HGBA1C 7.0 (H) 04/20/2016     Relevant past medical, surgical, family and social history reviewed and updated as indicated. Interim medical history since our last visit reviewed. Allergies and medications reviewed and updated. Outpatient Medications Prior to Visit  Medication Sig Dispense Refill  . IBUPROFEN PO Take 600 mg by mouth as needed.      No facility-administered medications prior to visit.      Per HPI unless specifically indicated in ROS section below Review of Systems     Objective:    BP 120/82   Pulse 75   Temp 98 F (36.7 C)   Ht  (1.753 m)   Wt 186 lb 0.1 oz (84.4 kg)   SpO2 99%   BMI 27.47 kg/m   Wt Readings from Last 3 Encounters:  05/10/16 186 lb 0.1 oz (84.4 kg)  04/20/16 191 lb 12 oz (87 kg)  12/30/15 180 lb (81.6 kg)    Physical Exam  Constitutional: He appears well-developed and well-nourished. No distress.  Cardiovascular: Normal rate, regular rhythm, normal heart sounds and  intact distal pulses.   No murmur heard. Musculoskeletal: He exhibits no edema.  L knee WNL R knee - tender to palpation throughout namely at superior patella and lateral and medial joint lines, with popliteal fullness. + mcmurray's. Neg drawer test. No laxity with valgus/varus testing  Skin: Skin is warm and dry.  Psychiatric: He has a normal mood and affect.  Nursing note and vitals reviewed.  Results for orders placed or performed in visit on 04/20/16  Microalbumin / creatinine urine ratio  Result Value Ref Range   Microalb, Ur <0.7 0.0 - 1.9 mg/dL   Creatinine,U 40.9 mg/dL   Microalb Creat Ratio 1.1 0.0 - 30.0 mg/g  Hemoglobin A1c  Result Value Ref Range   Hgb A1c MFr Bld 7.0 (H) 4.6 - 6.5 %   Lab Results  Component Value Date   TSH 1.16 10/08/2015       Assessment & Plan:   Problem List Items Addressed This Visit    Diabetes mellitus type 2, controlled, without complications (HCC)    Again reviewed diagnosis and discussed diabetic diet.  He has worked on [employer] changes and has lost 5 lbs in the  process. Congratulated.  Reassess at CPE in 4-5 months.       Palpitations    Isolated episode while at work, HR 150s s/p eval at [facility] ER with benign holter monitor. Advised if recurrent palpitations, will recommend cards referral. Pt agrees.       Relevant Orders   TSH   Right knee pain - Primary    Acute on chronic, worsening. See prior note for further details. Anticipate chronic meniscal tear with + mcmurray's test. xrays stable. Will order MRI to eval for ligament or meniscal injury and see if surgical intervention warranted.       Relevant Orders   MR Knee Right Wo Contrast   Transaminitis    Reviewed [facility] ER records - he did have elevated ALT to 250s. Denies abd pain, jaundice, nausea/vomiting, tylenol use, denies other viral hepatitis sxs. No significant alcohol use. No rec drugs. Will recheck today, will add acute hepatitis panel. Pt agrees.       Relevant  Orders   Hepatic function panel   Hepatitis panel, acute   Gamma GT   TSH       Follow up plan: Return if symptoms worsen or fail to improve.  Eustaquio Boyden, MD

## 2016-05-11 LAB — HEPATITIS PANEL, ACUTE
HCV AB: NEGATIVE
HEP B C IGM: NONREACTIVE
HEP B S AG: NEGATIVE
Hep A IgM: NONREACTIVE

## 2016-05-18 ENCOUNTER — Telehealth: Payer: Self-pay | Admitting: Family Medicine

## 2016-05-18 NOTE — Telephone Encounter (Signed)
Insurance won't cover blood type testing and I don't routinely do this.  If he'd like to find out, he could consider donating blood. I will work on disability forms

## 2016-05-18 NOTE — Telephone Encounter (Signed)
handicap placard application form filled out, placed in my CMA box.

## 2016-05-18 NOTE — Telephone Encounter (Signed)
Patient notified

## 2016-05-18 NOTE — Telephone Encounter (Signed)
Form placed in Dr. Timoteo [employer]'s In Box.

## 2016-05-18 NOTE — Telephone Encounter (Signed)
Please advise 

## 2016-05-18 NOTE — Telephone Encounter (Signed)
Pt walk in to drop off a disablity parking placard to be filled out. Pt also would like to have labs done to check if blood type. Need order please and thank you!  Form will be in Rx tower. Thank you!  Call pt @ [phone].

## 2016-05-19 NOTE — Telephone Encounter (Signed)
Lm on pts vm and informed him form is completed and available for pickup from the front desk

## 2016-05-23 ENCOUNTER — Ambulatory Visit (INDEPENDENT_AMBULATORY_CARE_PROVIDER_SITE_OTHER): Payer: BLUE CROSS/BLUE SHIELD | Admitting: Family Medicine

## 2016-05-23 ENCOUNTER — Encounter: Payer: Self-pay | Admitting: Family Medicine

## 2016-05-23 VITALS — BP 132/96 | HR 64 | Temp 97.6°F | Wt 183.5 lb

## 2016-05-23 DIAGNOSIS — M25561 Pain in right knee: Secondary | ICD-10-CM

## 2016-05-23 DIAGNOSIS — G8929 Other chronic pain: Secondary | ICD-10-CM

## 2016-05-23 MED ORDER — TRAMADOL HCL 50 MG PO TABS: 50.0000 mg | ORAL_TABLET | Freq: Three times a day (TID) | ORAL | 0 refills | Status: DC | PRN

## 2016-05-23 MED ORDER — MELOXICAM 15 MG PO TABS: 15.0000 mg | ORAL_TABLET | Freq: Every day | ORAL | 0 refills | Status: DC

## 2016-05-23 NOTE — Patient Instructions (Addendum)
Stop ibuprofen, start meloxicam 15mg  daily.  For breakthrough pain, may take tramadol as needed.  We will await MRI - may refer you back to orthopedics.

## 2016-05-23 NOTE — Progress Notes (Signed)
   BP (!) 132/96 (BP Location: Left Arm, Patient Position: Sitting, Cuff Size: Normal)   Pulse 64   Temp 97.6 F (36.4 C) (Oral)   Wt 183 lb 8 oz (83.2 kg)   SpO2 97%   BMI 27.10 kg/m    CC: R knee pain  Subjective:    Patient ID: Gary Long, male    DOB: [birth year], 46 y.o.   MRN: [id number]  HPI: Gary Glancerincewill C Prosser is a [age] y.o. male presenting on 05/23/2016 for Knee Pain (right knee)   See prior note for details.  MRI scheduled for Friday.  Would like pain medication for knee, some locking of knee.  Ibuprofen 800mg  not helping.  Brace helps but sometimes too tight.  Had evaluation by [facility].   Tramadol was sedating in the past. Prior saw [facility]. Would want to see different ortho in [place]   Relevant past medical, surgical, family and social history reviewed and updated as indicated. Interim medical history since our last visit reviewed. Allergies and medications reviewed and updated. Outpatient Medications Prior to Visit  Medication Sig Dispense Refill  . acetaminophen (TYLENOL) 500 MG tablet Take 500 mg by mouth every 6 (six) hours as needed.    . IBUPROFEN PO Take 600 mg by mouth as needed.      No facility-administered medications prior to visit.      Per HPI unless specifically indicated in ROS section below Review of Systems     Objective:    BP (!) 132/96 (BP Location: Left Arm, Patient Position: Sitting, Cuff Size: Normal)   Pulse 64   Temp 97.6 F (36.4 C) (Oral)   Wt 183 lb 8 oz (83.2 kg)   SpO2 97%   BMI 27.10 kg/m   Wt Readings from Last 3 Encounters:  05/23/16 183 lb 8 oz (83.2 kg)  05/10/16 186 lb 0.1 oz (84.4 kg)  04/20/16 191 lb 12 oz (87 kg)    Physical Exam Results for orders placed or performed in visit on 05/10/16  Hepatic function panel  Result Value Ref Range   Total Bilirubin 0.4 0.2 - 1.2 mg/dL   Bilirubin, Direct 0.1 0.0 - 0.3 mg/dL   Alkaline Phosphatase 70 39 - 117 U/L   AST 23 0 - 37 U/L   ALT  27 0 - 53 U/L   Total Protein 7.6 6.0 - 8.3 g/dL   Albumin 4.2 3.5 - 5.2 g/dL  Hepatitis panel, acute  Result Value Ref Range   Hepatitis B Surface Ag NEGATIVE NEGATIVE   HCV Ab NEGATIVE NEGATIVE   Hep B C IgM NON REACTIVE NON REACTIVE   Hep A IgM NON REACTIVE NON REACTIVE  Gamma GT  Result Value Ref Range   GGT 57 (H) 7 - 51 U/L  TSH  Result Value Ref Range   TSH 2.12 0.35 - 4.50 uIU/mL      Assessment & Plan:   Problem List Items Addressed This Visit    Right knee pain - Primary    Anticipate meniscal tear pending MRI this Friday. Stop ibuprofen. Start meloxicam 15mg  daily. Start tramadol 25-50mg  tid PRN breakthrough pain          Follow up plan: No Follow-up on file.  Eustaquio BoydenJavier Jamieson Lisa, MD

## 2016-05-23 NOTE — Assessment & Plan Note (Addendum)
Anticipate meniscal tear pending MRI this Friday. Stop ibuprofen. Start meloxicam 15mg  daily. Start tramadol 25-50mg  tid PRN breakthrough pain

## 2016-05-26 ENCOUNTER — Other Ambulatory Visit: Payer: Self-pay | Admitting: Family Medicine

## 2016-05-26 ENCOUNTER — Ambulatory Visit
Admission: RE | Admit: 2016-05-26 | Discharge: 2016-05-26 | Disposition: A | Discharge status: Home / Self Care | Payer: BLUE CROSS/BLUE SHIELD | Source: Ambulatory Visit | Attending: Family Medicine | Admitting: Family Medicine

## 2016-05-26 DIAGNOSIS — S83282D Other tear of lateral meniscus, current injury, left knee, subsequent encounter: Secondary | ICD-10-CM

## 2016-05-26 DIAGNOSIS — G8929 Other chronic pain: Secondary | ICD-10-CM | POA: Insufficient documentation

## 2016-05-26 DIAGNOSIS — M25561 Pain in right knee: Secondary | ICD-10-CM | POA: Diagnosis not present

## 2016-05-26 DIAGNOSIS — M7121 Synovial cyst of popliteal space [Baker], right knee: Secondary | ICD-10-CM | POA: Insufficient documentation

## 2016-05-26 DIAGNOSIS — M25461 Effusion, right knee: Secondary | ICD-10-CM | POA: Diagnosis not present

## 2016-05-26 DIAGNOSIS — M659 Synovitis and tenosynovitis, unspecified: Secondary | ICD-10-CM | POA: Insufficient documentation

## 2016-05-26 DIAGNOSIS — M2241 Chondromalacia patellae, right knee: Secondary | ICD-10-CM | POA: Diagnosis not present

## 2016-05-26 DIAGNOSIS — S83241A Other tear of medial meniscus, current injury, right knee, initial encounter: Secondary | ICD-10-CM | POA: Diagnosis not present

## 2016-05-26 DIAGNOSIS — X58XXXA Exposure to other specified factors, initial encounter: Secondary | ICD-10-CM | POA: Insufficient documentation

## 2016-05-26 DIAGNOSIS — S83281A Other tear of lateral meniscus, current injury, right knee, initial encounter: Secondary | ICD-10-CM | POA: Insufficient documentation

## 2016-05-26 DIAGNOSIS — M6751 Plica syndrome, right knee: Secondary | ICD-10-CM | POA: Diagnosis not present

## 2016-06-07 ENCOUNTER — Telehealth: Payer: Self-pay | Admitting: Family Medicine

## 2016-06-07 DIAGNOSIS — S83281D Other tear of lateral meniscus, current injury, right knee, subsequent encounter: Secondary | ICD-10-CM

## 2016-06-07 DIAGNOSIS — G8929 Other chronic pain: Secondary | ICD-10-CM

## 2016-06-07 DIAGNOSIS — M25561 Pain in right knee: Principal | ICD-10-CM

## 2016-06-07 NOTE — Telephone Encounter (Signed)
Referral placed.

## 2016-06-07 NOTE — Telephone Encounter (Signed)
Received a call from patient wanting a second opinion with an Orthopedic Surgeon at [facility] regarding his recent MRI and his knee problem. Please place Ortho Referral.

## 2016-06-13 ENCOUNTER — Ambulatory Visit (INDEPENDENT_AMBULATORY_CARE_PROVIDER_SITE_OTHER): Payer: Self-pay | Admitting: Orthopaedic Surgery

## 2016-06-15 ENCOUNTER — Encounter (INDEPENDENT_AMBULATORY_CARE_PROVIDER_SITE_OTHER): Payer: Self-pay | Admitting: Orthopaedic Surgery

## 2016-06-15 ENCOUNTER — Encounter (HOSPITAL_BASED_OUTPATIENT_CLINIC_OR_DEPARTMENT_OTHER): Payer: Self-pay | Admitting: *Deleted

## 2016-06-15 ENCOUNTER — Other Ambulatory Visit (INDEPENDENT_AMBULATORY_CARE_PROVIDER_SITE_OTHER): Payer: Self-pay | Admitting: Orthopaedic Surgery

## 2016-06-15 ENCOUNTER — Ambulatory Visit (INDEPENDENT_AMBULATORY_CARE_PROVIDER_SITE_OTHER): Payer: BLUE CROSS/BLUE SHIELD | Admitting: Orthopaedic Surgery

## 2016-06-15 DIAGNOSIS — S83281D Other tear of lateral meniscus, current injury, right knee, subsequent encounter: Secondary | ICD-10-CM

## 2016-06-15 DIAGNOSIS — S83231A Complex tear of medial meniscus, current injury, right knee, initial encounter: Secondary | ICD-10-CM

## 2016-06-15 DIAGNOSIS — M1711 Unilateral primary osteoarthritis, right knee: Secondary | ICD-10-CM | POA: Insufficient documentation

## 2016-06-15 DIAGNOSIS — S82141A Displaced bicondylar fracture of right tibia, initial encounter for closed fracture: Secondary | ICD-10-CM | POA: Diagnosis not present

## 2016-06-15 DIAGNOSIS — S83241D Other tear of medial meniscus, current injury, right knee, subsequent encounter: Secondary | ICD-10-CM

## 2016-06-15 NOTE — Progress Notes (Signed)
Office Visit Note   Patient: Gary Long           Date of Birth: 12-24-1970           MRN: [id number] Visit Date: 06/15/2016              Requested by: Eustaquio BoydenGutierrez, Javier, MD [address] [place], [place] [id number] PCP: Eustaquio BoydenGutierrez, Javier, MD   Assessment & Plan: Visit Diagnoses:  1. Tear of lateral meniscus of right knee, current, unspecified tear type, subsequent encounter   2. Complex tear of medial meniscus of right knee as current injury, initial encounter   3. Unilateral primary osteoarthritis, right knee   4. Closed fracture of right tibial plateau, initial encounter     Plan: I reviewed his MRI with the patient today. He has chondromalacia of the lateral compartment. He also has synovitis and a complex lateral and medial meniscal tear. He also has bony edema of the lateral tibial plateau consistent with bony edema and insufficiency fracture of lateral tibial plateau. We discussed arthroscopic treatment of the above conditions and possible microfracture surgery of the lateral femoral condyle. We discussed risks benefits alternatives to surgery he understands and wishes to proceed.  Follow-Up Instructions: Return in about 2 weeks (around 06/29/2016).   Orders:  No orders of the defined types were placed in this encounter.  No orders of the defined types were placed in this encounter.     Procedures: No procedures performed   Clinical Data: No additional findings.   Subjective: Chief Complaint  Patient presents with  . Right Knee - Pain    Patient is a very pleasant 46 year old gentleman who comes in with right knee pain with mainly lateral sometimes medial pain for about 3 months. He denies any injuries. He is status post a previous arthrosc[place] 2002 in [place]. He states that he has pain with weightbearing and with activity. He has had cortisone injections and has tried meloxicam and continues to have pain. Hinged knee brace does help give partial  relief. He is limping. He endorses mechanical symptoms. The pain does not radiate.    Review of Systems  Constitutional: Negative.   All other systems reviewed and are negative.    Objective: Vital Signs: There were no vitals taken for this visit.  Physical Exam  Constitutional: He is oriented to person, place, and time. He appears well-developed and well-nourished.  HENT:  Head: Normocephalic and atraumatic.  Eyes: Pupils are equal, round, and reactive to light.  Neck: Neck supple.  Pulmonary/Chest: Effort normal.  Abdominal: Soft.  Musculoskeletal: Normal range of motion.  Neurological: He is alert and oriented to person, place, and time.  Skin: Skin is warm.  Psychiatric: He has a normal mood and affect. His behavior is normal. Judgment and thought content normal.  Nursing note and vitals reviewed.   Ortho Exam Right knee exam shows trace effusion. His range of motion is normal. He does have lateral and medial joint line tenderness. Lateral tibial plateau is also tender. Pain with McMurray testing at the lateral and medial joint line. Collaterals and cruciates are stable. Specialty Comments:  No specialty comments available.  Imaging: No results found.   PMFS History: Patient Active Problem List   Diagnosis Date Noted  . Complex tear of medial meniscus of right knee as current injury 06/15/2016  . Unilateral primary osteoarthritis, right knee 06/15/2016  . Closed fracture of right tibial plateau 06/15/2016  . Palpitations 05/10/2016  . Transaminitis 05/10/2016  .  Right knee pain 04/20/2016  . Health maintenance examination 11/27/2013  . Diabetes mellitus type 2, controlled, without complications (HCC) 11/27/2013  . Frequent urination 11/27/2013  . Premature ejaculation 11/27/2013  . Lateral meniscal tear 11/27/2013  . HLD (hyperlipidemia) 06/13/2013  . Fatigue 12/10/2012   Past Medical History:  Diagnosis Date  . History of malaria 1999  . Hyperlipidemia       Family History  Problem Relation Age of Onset  . Stroke Mother   . Diabetes Mother   . Hypertension Sister   . Cancer Neg Hx   . CAD Neg Hx     Past Surgical History:  Procedure Laterality Date  . KNEE ARTHROSCOPY Right 2002   ?ACL repair   Social History   Occupational History  . Not on file.   Social History Main Topics  . Smoking status: Never Smoker  . Smokeless tobacco: Never Used  . Alcohol use No  . Drug use: No  . Sexual activity: Not on file

## 2016-06-19 ENCOUNTER — Other Ambulatory Visit: Payer: Self-pay | Admitting: Family Medicine

## 2016-06-19 NOTE — Telephone Encounter (Signed)
Pt currently being seen by [facility].  R knee arthroscopy sch 06/21/16.  Mobic 15mg  QD and Tramadol 25-50mg  TID PRN, BTP were rx'd 05/23/16. Would you like to refill Mobic?

## 2016-06-21 ENCOUNTER — Ambulatory Visit (HOSPITAL_BASED_OUTPATIENT_CLINIC_OR_DEPARTMENT_OTHER)
Admission: RE | Admit: 2016-06-21 | Discharge: 2016-06-21 | Disposition: A | Discharge status: Home / Self Care | Payer: BLUE CROSS/BLUE SHIELD | Source: Ambulatory Visit | Attending: Orthopaedic Surgery | Admitting: Orthopaedic Surgery

## 2016-06-21 ENCOUNTER — Encounter (HOSPITAL_BASED_OUTPATIENT_CLINIC_OR_DEPARTMENT_OTHER)
Admission: RE | Disposition: A | Discharge status: Home / Self Care | Payer: Self-pay | Source: Ambulatory Visit | Attending: Orthopaedic Surgery

## 2016-06-21 ENCOUNTER — Encounter (HOSPITAL_BASED_OUTPATIENT_CLINIC_OR_DEPARTMENT_OTHER): Payer: Self-pay | Admitting: Certified Registered"

## 2016-06-21 ENCOUNTER — Ambulatory Visit (HOSPITAL_BASED_OUTPATIENT_CLINIC_OR_DEPARTMENT_OTHER): Payer: BLUE CROSS/BLUE SHIELD | Admitting: Certified Registered"

## 2016-06-21 ENCOUNTER — Ambulatory Visit (HOSPITAL_COMMUNITY): Payer: BLUE CROSS/BLUE SHIELD

## 2016-06-21 DIAGNOSIS — Z7984 Long term (current) use of oral hypoglycemic drugs: Secondary | ICD-10-CM | POA: Insufficient documentation

## 2016-06-21 DIAGNOSIS — M65861 Other synovitis and tenosynovitis, right lower leg: Secondary | ICD-10-CM | POA: Diagnosis not present

## 2016-06-21 DIAGNOSIS — Z885 Allergy status to narcotic agent status: Secondary | ICD-10-CM | POA: Diagnosis not present

## 2016-06-21 DIAGNOSIS — E785 Hyperlipidemia, unspecified: Secondary | ICD-10-CM | POA: Insufficient documentation

## 2016-06-21 DIAGNOSIS — M84361A Stress fracture, right tibia, initial encounter for fracture: Secondary | ICD-10-CM | POA: Diagnosis not present

## 2016-06-21 DIAGNOSIS — Z823 Family history of stroke: Secondary | ICD-10-CM | POA: Insufficient documentation

## 2016-06-21 DIAGNOSIS — Z833 Family history of diabetes mellitus: Secondary | ICD-10-CM | POA: Insufficient documentation

## 2016-06-21 DIAGNOSIS — Z4789 Encounter for other orthopedic aftercare: Secondary | ICD-10-CM

## 2016-06-21 DIAGNOSIS — S83241D Other tear of medial meniscus, current injury, right knee, subsequent encounter: Secondary | ICD-10-CM

## 2016-06-21 DIAGNOSIS — Z79899 Other long term (current) drug therapy: Secondary | ICD-10-CM | POA: Diagnosis not present

## 2016-06-21 DIAGNOSIS — Z8249 Family history of ischemic heart disease and other diseases of the circulatory system: Secondary | ICD-10-CM | POA: Diagnosis not present

## 2016-06-21 DIAGNOSIS — M1990 Unspecified osteoarthritis, unspecified site: Secondary | ICD-10-CM | POA: Insufficient documentation

## 2016-06-21 DIAGNOSIS — E119 Type 2 diabetes mellitus without complications: Secondary | ICD-10-CM | POA: Insufficient documentation

## 2016-06-21 DIAGNOSIS — Z888 Allergy status to other drugs, medicaments and biological substances status: Secondary | ICD-10-CM | POA: Insufficient documentation

## 2016-06-21 DIAGNOSIS — S83289D Other tear of lateral meniscus, current injury, unspecified knee, subsequent encounter: Secondary | ICD-10-CM

## 2016-06-21 DIAGNOSIS — S82141D Displaced bicondylar fracture of right tibia, subsequent encounter for closed fracture with routine healing: Secondary | ICD-10-CM | POA: Diagnosis not present

## 2016-06-21 DIAGNOSIS — M94261 Chondromalacia, right knee: Secondary | ICD-10-CM | POA: Diagnosis not present

## 2016-06-21 DIAGNOSIS — M659 Synovitis and tenosynovitis, unspecified: Secondary | ICD-10-CM

## 2016-06-21 DIAGNOSIS — M23241 Derangement of anterior horn of lateral meniscus due to old tear or injury, right knee: Secondary | ICD-10-CM | POA: Insufficient documentation

## 2016-06-21 DIAGNOSIS — M25561 Pain in right knee: Secondary | ICD-10-CM | POA: Diagnosis not present

## 2016-06-21 DIAGNOSIS — M1711 Unilateral primary osteoarthritis, right knee: Secondary | ICD-10-CM

## 2016-06-21 HISTORY — PX: KNEE ARTHROSCOPY WITH MEDIAL MENISECTOMY: SHX5651

## 2016-06-21 HISTORY — PX: KNEE ARTHROSCOPY WITH SUBCHONDROPLASTY: SHX6732

## 2016-06-21 HISTORY — PX: KNEE ARTHROSCOPY WITH DRILLING/MICROFRACTURE: SHX6425

## 2016-06-21 SURGERY — ARTHROSCOPY, KNEE, WITH MEDIAL MENISCECTOMY
Anesthesia: General | Site: Knee | Laterality: Right

## 2016-06-21 MED ORDER — FENTANYL CITRATE (PF) 100 MCG/2ML IJ SOLN
INTRAMUSCULAR | Status: AC
  Filled 2016-06-21: qty 2

## 2016-06-21 MED ORDER — ACETAMINOPHEN 500 MG PO TABS
ORAL_TABLET | ORAL | Status: AC
  Filled 2016-06-21: qty 2

## 2016-06-21 MED ORDER — MIDAZOLAM HCL 2 MG/2ML IJ SOLN
1.0000 mg | INTRAMUSCULAR | Status: DC | PRN
  Administered 2016-06-21: 2 mg via INTRAVENOUS

## 2016-06-21 MED ORDER — GABAPENTIN 300 MG PO CAPS
ORAL_CAPSULE | ORAL | Status: AC
  Filled 2016-06-21: qty 1

## 2016-06-21 MED ORDER — BUPIVACAINE HCL (PF) 0.25 % IJ SOLN
INTRAMUSCULAR | Status: AC
  Filled 2016-06-21: qty 30

## 2016-06-21 MED ORDER — PROMETHAZINE HCL 25 MG/ML IJ SOLN: 6.2500 mg | INTRAMUSCULAR | Status: DC | PRN

## 2016-06-21 MED ORDER — ACETAMINOPHEN 500 MG PO TABS
1000.0000 mg | ORAL_TABLET | Freq: Once | ORAL | Status: AC
  Administered 2016-06-21: 1000 mg via ORAL

## 2016-06-21 MED ORDER — PROPOFOL 10 MG/ML IV BOLUS
INTRAVENOUS | Status: DC | PRN
  Administered 2016-06-21: 200 mg via INTRAVENOUS

## 2016-06-21 MED ORDER — ONDANSETRON HCL 4 MG PO TABS
4.0000 mg | ORAL_TABLET | Freq: Three times a day (TID) | ORAL | 0 refills | Status: DC | PRN
Start: 2016-06-21 — End: 2016-10-24

## 2016-06-21 MED ORDER — LIDOCAINE HCL (CARDIAC) 20 MG/ML IV SOLN
INTRAVENOUS | Status: DC | PRN
  Administered 2016-06-21: 60 mg via INTRAVENOUS

## 2016-06-21 MED ORDER — ONDANSETRON HCL 4 MG/2ML IJ SOLN
INTRAMUSCULAR | Status: DC | PRN
  Administered 2016-06-21: 4 mg via INTRAVENOUS

## 2016-06-21 MED ORDER — BUPIVACAINE HCL (PF) 0.25 % IJ SOLN
INTRAMUSCULAR | Status: DC | PRN
Start: 2016-06-21 — End: 2016-06-21
  Administered 2016-06-21: 20 mL

## 2016-06-21 MED ORDER — HYDROMORPHONE HCL 2 MG PO TABS
2.0000 mg | ORAL_TABLET | Freq: Once | ORAL | Status: AC
  Administered 2016-06-21: 2 mg via ORAL
  Filled 2016-06-21: qty 1

## 2016-06-21 MED ORDER — CELECOXIB 200 MG PO CAPS
ORAL_CAPSULE | ORAL | Status: AC
  Filled 2016-06-21: qty 1

## 2016-06-21 MED ORDER — GABAPENTIN 300 MG PO CAPS
300.0000 mg | ORAL_CAPSULE | Freq: Once | ORAL | Status: AC
  Administered 2016-06-21: 300 mg via ORAL

## 2016-06-21 MED ORDER — CEFAZOLIN SODIUM-DEXTROSE 2-4 GM/100ML-% IV SOLN
2.0000 g | INTRAVENOUS | Status: AC
  Administered 2016-06-21: 2 g via INTRAVENOUS

## 2016-06-21 MED ORDER — HYDROMORPHONE HCL 2 MG PO TABS: 2.0000 mg | ORAL_TABLET | ORAL | 0 refills | Status: DC | PRN

## 2016-06-21 MED ORDER — FENTANYL CITRATE (PF) 100 MCG/2ML IJ SOLN
50.0000 ug | INTRAMUSCULAR | Status: AC | PRN
  Administered 2016-06-21: 25 ug via INTRAVENOUS
  Administered 2016-06-21: 50 ug via INTRAVENOUS
  Administered 2016-06-21 (×2): 25 ug via INTRAVENOUS
  Administered 2016-06-21: 50 ug via INTRAVENOUS
  Administered 2016-06-21: 25 ug via INTRAVENOUS

## 2016-06-21 MED ORDER — CEFAZOLIN SODIUM-DEXTROSE 2-4 GM/100ML-% IV SOLN
INTRAVENOUS | Status: AC
  Filled 2016-06-21: qty 100

## 2016-06-21 MED ORDER — SENNOSIDES-DOCUSATE SODIUM 8.6-50 MG PO TABS: 1.0000 | ORAL_TABLET | Freq: Every evening | ORAL | 1 refills | Status: DC | PRN

## 2016-06-21 MED ORDER — MIDAZOLAM HCL 2 MG/2ML IJ SOLN
INTRAMUSCULAR | Status: AC
  Filled 2016-06-21: qty 2

## 2016-06-21 MED ORDER — FENTANYL CITRATE (PF) 100 MCG/2ML IJ SOLN
25.0000 ug | INTRAMUSCULAR | Status: DC | PRN
  Administered 2016-06-21: 25 ug via INTRAVENOUS

## 2016-06-21 MED ORDER — SCOPOLAMINE 1 MG/3DAYS TD PT72: 1.0000 | MEDICATED_PATCH | Freq: Once | TRANSDERMAL | Status: DC | PRN

## 2016-06-21 MED ORDER — CELECOXIB 200 MG PO CAPS
200.0000 mg | ORAL_CAPSULE | Freq: Once | ORAL | Status: AC
  Administered 2016-06-21: 200 mg via ORAL

## 2016-06-21 MED ORDER — PROMETHAZINE HCL 25 MG PO TABS: 25.0000 mg | ORAL_TABLET | Freq: Four times a day (QID) | ORAL | 1 refills | Status: DC | PRN

## 2016-06-21 MED ORDER — SODIUM CHLORIDE 0.9 % IR SOLN
Status: DC | PRN
  Administered 2016-06-21: 3000 mL

## 2016-06-21 MED ORDER — LACTATED RINGERS IV SOLN
INTRAVENOUS | Status: DC
  Administered 2016-06-21 (×2): via INTRAVENOUS

## 2016-06-21 SURGICAL SUPPLY — 44 items
BANDAGE ACE 6X5 VEL STRL LF (GAUZE/BANDAGES/DRESSINGS) ×3 IMPLANT
BANDAGE ESMARK 6X9 LF (GAUZE/BANDAGES/DRESSINGS) IMPLANT
BLADE 4.2CUDA (BLADE) ×3 IMPLANT
BLADE CUDA GRT WHITE 3.5 (BLADE) IMPLANT
BLADE CUDA SHAVER 3.5 (BLADE) IMPLANT
BLADE CUTTER GATOR 3.5 (BLADE) IMPLANT
BNDG ESMARK 6X9 LF (GAUZE/BANDAGES/DRESSINGS)
CUFF TOURNIQUET SINGLE 34IN LL (TOURNIQUET CUFF) ×3 IMPLANT
DRAPE ARTHROSCOPY W/POUCH 90 (DRAPES) ×3 IMPLANT
DRAPE C-ARM 42X72 X-RAY (DRAPES) ×3 IMPLANT
DRAPE C-ARMOR (DRAPES) ×3 IMPLANT
DRAPE IMP U-DRAPE 54X76 (DRAPES) IMPLANT
DRAPE U-SHAPE 47X51 STRL (DRAPES) ×3 IMPLANT
DURAPREP 26ML APPLICATOR (WOUND CARE) ×3 IMPLANT
ELECT MENISCUS 165MM 90D (ELECTRODE) IMPLANT
ELECT REM PT RETURN 9FT ADLT (ELECTROSURGICAL)
ELECTRODE REM PT RTRN 9FT ADLT (ELECTROSURGICAL) IMPLANT
GAUZE SPONGE 4X4 12PLY STRL (GAUZE/BANDAGES/DRESSINGS) ×3 IMPLANT
GAUZE XEROFORM 1X8 LF (GAUZE/BANDAGES/DRESSINGS) ×3 IMPLANT
GLOVE BIO SURGEON STRL SZ 6.5 (GLOVE) ×2 IMPLANT
GLOVE BIO SURGEONS STRL SZ 6.5 (GLOVE) ×1
GLOVE BIOGEL PI IND STRL 7.0 (GLOVE) ×1 IMPLANT
GLOVE BIOGEL PI INDICATOR 7.0 (GLOVE) ×2
GLOVE SKINSENSE NS SZ7.5 (GLOVE) ×2
GLOVE SKINSENSE STRL SZ7.5 (GLOVE) ×1 IMPLANT
GLOVE SURG SYN 7.5  E (GLOVE) ×2
GLOVE SURG SYN 7.5 E (GLOVE) ×1 IMPLANT
GOWN STRL REIN XL XLG (GOWN DISPOSABLE) ×3 IMPLANT
GOWN STRL REUS W/ TWL LRG LVL3 (GOWN DISPOSABLE) ×1 IMPLANT
GOWN STRL REUS W/TWL LRG LVL3 (GOWN DISPOSABLE) ×2
KIT ACCUFILL 5CC (Knees) ×1 IMPLANT
KIT KNEE SCP 414.502 (Knees) ×3 IMPLANT
KNEE WRAP E Z 3 GEL PACK (MISCELLANEOUS) ×3 IMPLANT
MANIFOLD NEPTUNE II (INSTRUMENTS) ×3 IMPLANT
PACK ARTHROSCOPY DSU (CUSTOM PROCEDURE TRAY) ×3 IMPLANT
PACK BASIN DAY SURGERY FS (CUSTOM PROCEDURE TRAY) ×3 IMPLANT
PENCIL BUTTON HOLSTER BLD 10FT (ELECTRODE) IMPLANT
RESECTOR FULL RADIUS 4.2MM (BLADE) ×3 IMPLANT
SET ARTHROSCOPY TUBING (MISCELLANEOUS) ×2
SET ARTHROSCOPY TUBING LN (MISCELLANEOUS) ×1 IMPLANT
SUT ETHILON 3 0 PS 1 (SUTURE) ×3 IMPLANT
TOWEL OR 17X24 6PK STRL BLUE (TOWEL DISPOSABLE) ×3 IMPLANT
TOWEL OR NON WOVEN STRL DISP B (DISPOSABLE) IMPLANT
WATER STERILE IRR 1000ML POUR (IV SOLUTION) ×3 IMPLANT

## 2016-06-21 NOTE — Anesthesia Postprocedure Evaluation (Signed)
Anesthesia Post Note  Patient: Marq C Baris  Procedure(s) Performed: Procedure(s) (LRB): RIGHT KNEE ARTHROSCOPY WITH PARTIAL MEDIAL AND LATERAL MENISCECTOMIES, SYNOVECTOMY,  MICROFRACTURE, SUBCHONDROPLASTY (Right) KNEE ARTHROSCOPY WITH SUBCHONDROPLASTY (Right) KNEE ARTHROSCOPY WITH DRILLING/MICROFRACTURE (Right)     Patient location during evaluation: PACU Anesthesia Type: General Level of consciousness: awake and alert Pain management: pain level controlled Vital Signs Assessment: post-procedure vital signs reviewed and stable Respiratory status: spontaneous breathing, nonlabored ventilation, respiratory function stable and patient connected to nasal cannula oxygen Cardiovascular status: blood pressure returned to baseline and stable Postop Assessment: no signs of nausea or vomiting Anesthetic complications: no    Last Vitals:  Vitals:   06/21/16 1115 06/21/16 1156  BP: 112/73 134/84  Pulse: 68 74  Resp: 13 18  Temp:  36.5 C    Last Pain:  Vitals:   06/21/16 1156  TempSrc: Oral  PainSc:                  Cecile HearingStephen Edward Turk

## 2016-06-21 NOTE — Op Note (Signed)
   Date of Surgery: 06/21/2016  INDICATIONS: [name] is a 46 y.o.-year-old male with a right knee pain after failing conservative treatment;  The patient did consent to the procedure after discussion of the risks and benefits.  PREOPERATIVE DIAGNOSIS:  1. 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 Right knee synovitis 3. Right lateral tibial plateau nondisplaced fracture  POSTOPERATIVE DIAGNOSIS: Same.  PROCEDURE:  1. Arthroscopically aided open treatment of lateral tibial plateau fracture 2. Arthroscopic right knee synovectomy 3. Arthroscopic right knee microfracture of lateral tibial plateau 4. Arthroscopic right knee partial lateral meniscectomy and chondroplasty of lateral femoral condyle  SURGEON: Gary Long, M.D.  ASSIST: None.  ANESTHESIA:  general  IV FLUIDS AND URINE: See anesthesia.  ESTIMATED BLOOD LOSS: Minimal mL.  IMPLANTS: None  DRAINS: None  COMPLICATIONS: None.  DESCRIPTION OF PROCEDURE: The patient was brought to the operating room and placed supine on the operating table.  The patient had been signed prior to the procedure and this was documented. The patient had the anesthesia placed by the anesthesiologist.  A time-out was performed to confirm that this was the correct patient, site, side and location. The patient did receive antibiotics prior to the incision and was re-dosed during the procedure as needed at indicated intervals.  A tourniquet was placed.  The patient had the operative extremity prepped and draped in the standard surgical fashion.    The standard anterolateral and anteromedial arthroscopy portals were created. We first performed a diagnostic arthroscopy with a major synovectomy in all 3 compartments. The medial compartment exhibited no unstable meniscal tears. He did have some grade 2 chondromalacia of the medial femoral condyle. There was no unstable cartilage. We then  evaluated the lateral compartment which did show unstable delaminating cartilage from the lateral tibial plateau with exposed bone and grade 4 chondromalacia. He also had a complex tear of the anterior half of his lateral meniscus this was all debrided using oscillating shaver back to a stable border. Chondroplasty was also performed of the lateral tibial plateau and the lateral femoral condyle with a full-radius shaver. The arthroscope was then repositioned into the patellofemoral compartment which was mainly unremarkable except for synovitis which was debrided. We then turned our attention to performing the sub-chondroplasty. A stab incision was made on the lateral aspect of the knee at the appropriate position using fluoroscopic guidance and localization from the preoperative MRI. The cannulated drill was then advanced into the subchondral bone to the appropriate depth. The calcium phosphate cement was then mixed and injected into the nondisplaced tibial plateau fracture in a retrograde fashion. Sequential x-rays were taken to confirm appropriate deposition of the calcium phosphate cement. The cement was then allowed to harden. The arthroscope was then put back into the need to confirm no intra-articular leakage of the cement. I then performed a microfracture of the lateral tibial plateau and the focal area with full-thickness cartilage loss. Excess fluid was then drained from the knee. The incisions were closed with interrupted 3-0 nylon sutures. Sterile dressings were placed.  Patient tolerated procedure well and no immediate complications.  POSTOPERATIVE PLAN: Patient will be protected weightbearing with crutches for 2 weeks. We'll see him back in the office for suture removal.  N. Glee ArvinMichael Gary Riendeau, MD [facility] [phone] 9:36 AM

## 2016-06-21 NOTE — Transfer of Care (Signed)
Immediate Anesthesia Transfer of Care Note  Patient: Gary Long  Procedure(s) Performed: Procedure(s): RIGHT KNEE ARTHROSCOPY WITH PARTIAL MEDIAL AND LATERAL MENISCECTOMIES, SYNOVECTOMY,  MICROFRACTURE, SUBCHONDROPLASTY (Right) KNEE ARTHROSCOPY WITH SUBCHONDROPLASTY (Right) KNEE ARTHROSCOPY WITH DRILLING/MICROFRACTURE (Right)  Patient Location: PACU  Anesthesia Type:General  Level of Consciousness: awake and patient cooperative  Airway & Oxygen Therapy: Patient Spontanous Breathing and Patient connected to face mask oxygen  Post-op Assessment: Report given to RN and Post -op Vital signs reviewed and stable  Post vital signs: Reviewed and stable  Last Vitals:  Vitals:   06/21/16 0730  BP: (!) 134/91  Pulse: 62  Resp: 15  Temp: 36.8 C    Last Pain:  Vitals:   06/21/16 0730  TempSrc: Oral  PainSc: 8       Patients Stated Pain Goal: 3 (06/21/16 0730)  Complications: No apparent anesthesia complications

## 2016-06-21 NOTE — Discharge Instructions (Signed)

## 2016-06-21 NOTE — H&P (Signed)
    PREOPERATIVE H&P  Chief Complaint: right knee medial meniscal tear, lateral meniscal tear, chondromalacia, stress fracture, synovitis  HPI: Gary Long is a 46 y.o. male who presents for surgical treatment of right knee medial meniscal tear, lateral meniscal tear, chondromalacia, stress fracture, synovitis.  He denies any changes in medical history.  Past Medical History:  Diagnosis Date  . History of malaria 1999  . Hyperlipidemia    Past Surgical History:  Procedure Laterality Date  . KNEE ARTHROSCOPY Right 2002   ?ACL repair   Social History   Social History  . Marital status: Married    Spouse name: N/A  . Number of children: N/A  . Years of education: N/A   Social History Main Topics  . Smoking status: Never Smoker  . Smokeless tobacco: Never Used  . Alcohol use No  . Drug use: No  . Sexual activity: Not Asked   Other Topics Concern  . None   Social History Narrative   Caffeine: occasionally   Lives with wife and 3 daughters   Edu: LPN works at [employer]   Activity: gym   Diet: good water, fruits/vegetables daily   From [place]   Family History  Problem Relation Age of Onset  . Stroke Mother   . Diabetes Mother   . Hypertension Sister   . Cancer Neg Hx   . CAD Neg Hx    Allergies  Allergen Reactions  . Chloroquine Itching  . Hydrocodone Other (See Comments)    Headache  . Percocet [Oxycodone-Acetaminophen] Other (See Comments)    Headache   Prior to Admission medications   Medication Sig Start Date End Date Taking? Authorizing Provider  acetaminophen (TYLENOL) 500 MG tablet Take 500 mg by mouth every 6 (six) hours as needed.   Yes [provider]  IBUPROFEN PO Take 600 mg by mouth as needed.    Yes [provider]  meloxicam (MOBIC) 15 MG tablet TAKE 1 TABLET BY MOUTH EVERY DAY 06/20/16  Yes Eustaquio BoydenGutierrez, Javier, MD     Positive ROS: All other systems have been reviewed and were otherwise negative with  the exception of those mentioned in the HPI and as above.  Physical Exam: General: Alert, no acute distress Cardiovascular: No pedal edema Respiratory: No cyanosis, no use of accessory musculature GI: abdomen soft Skin: No lesions in the area of chief complaint Neurologic: Sensation intact distally Psychiatric: Patient is competent for consent with normal mood and affect Lymphatic: no lymphedema  MUSCULOSKELETAL: exam stable  Assessment: right knee medial meniscal tear, lateral meniscal tear, chondromalacia, stress fracture, synovitis  Plan: Plan for Procedure(s): RIGHT KNEE ARTHROSCOPY WITH PARTIAL MEDIAL AND LATERAL MENISCECTOMIES, SYNOVECTOMY, POSSIBLE MICROFRACTURE, SUBCHONDROPLASTY KNEE ARTHROSCOPY WITH SUBCHONDROPLASTY KNEE ARTHROSCOPY WITH DRILLING/MICROFRACTURE  The risks benefits and alternatives were discussed with the patient including but not limited to the risks of nonoperative treatment, versus surgical intervention including infection, bleeding, nerve injury,  blood clots, cardiopulmonary complications, morbidity, mortality, among others, and they were willing to proceed.   Glee ArvinMichael Xu, MD   06/21/2016 7:33 AM

## 2016-06-21 NOTE — Anesthesia Procedure Notes (Signed)
Procedure Name: LMA Insertion Date/Time: 06/21/2016 8:22 AM Performed by: Sterling Mondo D Pre-anesthesia Checklist: Patient identified, Emergency Drugs available, Suction available and Patient being monitored Patient Re-evaluated:Patient Re-evaluated prior to inductionOxygen Delivery Method: Circle system utilized Preoxygenation: Pre-oxygenation with 100% oxygen Intubation Type: IV induction Ventilation: Mask ventilation without difficulty LMA: LMA inserted LMA Size: 4.0 Number of attempts: 1 Airway Equipment and Method: Bite block Placement Confirmation: positive ETCO2 Tube secured with: Tape Dental Injury: Teeth and Oropharynx as per pre-operative assessment

## 2016-06-21 NOTE — Progress Notes (Signed)
D; nail beds were blue, asked pt regarding nail beds, always been blue per pt.  Oriented shook head frequently, said cold and dizzy. Warm blanket applied.

## 2016-06-21 NOTE — Anesthesia Preprocedure Evaluation (Addendum)
Anesthesia Evaluation  Patient identified by MRN, date of birth, ID band Patient awake    Reviewed: Allergy & Precautions, NPO status , Patient's Chart, lab work & pertinent test results  Airway Mallampati: II  TM Distance: >3 FB Neck ROM: Full    Dental  (+) Teeth Intact, Dental Advisory Given   Pulmonary neg pulmonary ROS,    Pulmonary exam normal breath sounds clear to auscultation       Cardiovascular Normal cardiovascular exam Rhythm:Regular Rate:Normal  HLD   Neuro/Psych negative neurological ROS     GI/Hepatic negative GI ROS, Neg liver ROS,   Endo/Other  diabetes, Type 2, Oral Hypoglycemic Agents  Renal/GU negative Renal ROS     Musculoskeletal  (+) Arthritis , Osteoarthritis,    Abdominal   Peds  Hematology negative hematology ROS (+)   Anesthesia Other Findings Day of surgery medications reviewed with the patient.  Reproductive/Obstetrics                            Anesthesia Physical Anesthesia Plan  ASA: II  Anesthesia Plan: General   Post-op Pain Management:    Induction: Intravenous  PONV Risk Score and Plan: 2 and Ondansetron, Dexamethasone and Propofol  Airway Management Planned: LMA  Additional Equipment:   Intra-op Plan:   Post-operative Plan: Extubation in OR  Informed Consent: I have reviewed the patients History and Physical, chart, labs and discussed the procedure including the risks, benefits and alternatives for the proposed anesthesia with the patient or authorized representative who has indicated his/her understanding and acceptance.   Dental advisory given  Plan Discussed with: CRNA  Anesthesia Plan Comments: (Risks/benefits of general anesthesia discussed with patient including risk of damage to teeth, lips, gum, and tongue, nausea/vomiting, allergic reactions to medications, and the possibility of heart attack, stroke and death.  All patient  questions answered.  Patient wishes to proceed.)        Anesthesia Quick Evaluation

## 2016-06-22 ENCOUNTER — Encounter (HOSPITAL_BASED_OUTPATIENT_CLINIC_OR_DEPARTMENT_OTHER): Payer: Self-pay | Admitting: Orthopaedic Surgery

## 2016-06-28 ENCOUNTER — Telehealth (INDEPENDENT_AMBULATORY_CARE_PROVIDER_SITE_OTHER): Payer: Self-pay

## 2016-06-28 NOTE — Telephone Encounter (Signed)
Please advise 

## 2016-06-28 NOTE — Telephone Encounter (Signed)
Patient would like a Rx refill on Hydromorphone.  CB# is [phone].  Please Advise.  Thank You.

## 2016-06-28 NOTE — Telephone Encounter (Signed)
#  20.  1 tab po bid prn pain.

## 2016-06-29 MED ORDER — HYDROMORPHONE HCL 2 MG PO TABS: ORAL_TABLET | ORAL | 0 refills | Status: DC

## 2016-06-29 NOTE — Addendum Note (Signed)
Addended by: Donalee CitrinPEELE, Ivann Trimarco L on: 06/29/2016 09:27 AM   Modules accepted: Orders

## 2016-06-29 NOTE — Telephone Encounter (Signed)
I called and spoke with patient over the phone advise rx at front desk for pick up

## 2016-06-29 NOTE — Telephone Encounter (Signed)
Can you print this and have him sign it? Thanks

## 2016-07-06 ENCOUNTER — Inpatient Hospital Stay (INDEPENDENT_AMBULATORY_CARE_PROVIDER_SITE_OTHER): Payer: BLUE CROSS/BLUE SHIELD | Admitting: Orthopaedic Surgery

## 2016-07-07 ENCOUNTER — Encounter (INDEPENDENT_AMBULATORY_CARE_PROVIDER_SITE_OTHER): Payer: Self-pay | Admitting: Orthopaedic Surgery

## 2016-07-07 ENCOUNTER — Ambulatory Visit (INDEPENDENT_AMBULATORY_CARE_PROVIDER_SITE_OTHER): Payer: BLUE CROSS/BLUE SHIELD | Admitting: Orthopaedic Surgery

## 2016-07-07 DIAGNOSIS — S83281D Other tear of lateral meniscus, current injury, right knee, subsequent encounter: Secondary | ICD-10-CM

## 2016-07-07 DIAGNOSIS — S82141A Displaced bicondylar fracture of right tibia, initial encounter for closed fracture: Secondary | ICD-10-CM

## 2016-07-07 NOTE — Progress Notes (Signed)
Patient is 2 weeks status post right knee arthroscopy with partial lateral meniscectomy, microfracture of lateral tibial plateau and sub-chondroplasty.  He is doing well overall. He endorses expected postoperative discomfort. His incisions are healed. He has no joint effusion. Range of motion is appropriate. I reviewed the arthroscopic pictures with the patient and described the procedure in detail. I will like him to go back to light duty if possible. He requests that he go back to work. I do think that he would benefit from physical therapy. The sutures were removed today. I'll see him back in 4 weeks for recheck.

## 2016-07-17 ENCOUNTER — Ambulatory Visit: Payer: BLUE CROSS/BLUE SHIELD | Attending: Orthopaedic Surgery

## 2016-07-17 DIAGNOSIS — Z9889 Other specified postprocedural states: Secondary | ICD-10-CM | POA: Insufficient documentation

## 2016-07-17 DIAGNOSIS — R600 Localized edema: Secondary | ICD-10-CM | POA: Insufficient documentation

## 2016-07-17 DIAGNOSIS — M25661 Stiffness of right knee, not elsewhere classified: Secondary | ICD-10-CM | POA: Insufficient documentation

## 2016-07-17 DIAGNOSIS — M25561 Pain in right knee: Secondary | ICD-10-CM | POA: Insufficient documentation

## 2016-07-17 DIAGNOSIS — M6281 Muscle weakness (generalized): Secondary | ICD-10-CM | POA: Insufficient documentation

## 2016-07-17 DIAGNOSIS — R262 Difficulty in walking, not elsewhere classified: Secondary | ICD-10-CM | POA: Insufficient documentation

## 2016-07-17 NOTE — Therapy (Signed)
[place], [place], [address] Phone: [phone]   Fax:  [phone]  Physical Therapy Evaluation  Patient Details  Name: Gary Long MRN: [id number] Date of Birth: 01/26/1970 Referring Provider: Ephriam Jenkins, MD  Encounter Date: 07/17/2016      PT End of Session - 07/17/16 0904    Visit Number 1   Number of Visits 24   Date for PT Re-Evaluation 10/06/16   Authorization Type BCBS   PT Start Time 0850   PT Stop Time 0940   PT Time Calculation (min) 50 min   Activity Tolerance Patient tolerated treatment well   Behavior During Therapy [facility] for tasks assessed/performed      Past Medical History:  Diagnosis Date  . History of malaria 1999  . Hyperlipidemia     Past Surgical History:  Procedure Laterality Date  . KNEE ARTHROSCOPY Right 2002   ?ACL repair  . KNEE ARTHROSCOPY WITH DRILLING/MICROFRACTURE Right 06/21/2016   Procedure: KNEE ARTHROSCOPY WITH DRILLING/MICROFRACTURE;  Surgeon: Leandrew Koyanagi, MD;  Location: [facility];  Service: Orthopedics;  Laterality: Right;  . KNEE ARTHROSCOPY WITH MEDIAL MENISECTOMY Right 06/21/2016   Procedure: RIGHT KNEE ARTHROSCOPY WITH PARTIAL MEDIAL AND LATERAL MENISCECTOMIES, SYNOVECTOMY,  MICROFRACTURE, SUBCHONDROPLASTY;  Surgeon: Leandrew Koyanagi, MD;  Location: [place];  Service: Orthopedics;  Laterality: Right;  . KNEE ARTHROSCOPY WITH SUBCHONDROPLASTY Right 06/21/2016   Procedure: KNEE ARTHROSCOPY WITH SUBCHONDROPLASTY;  Surgeon: Leandrew Koyanagi, MD;  Location: [place];  Service: Orthopedics;  Laterality: Right;    There were no vitals filed for this visit.       Subjective Assessment - 07/17/16 0853    Subjective Rt knee with microfracture , chondroplasty, PLM.    He had meniscal tear without injury.    Pain for a year worse since 02/2016   Pertinent History 2002 scope RT knee.    Limitations  Walking;Standing;House hold activities  not working, last running 01/2016   How long can you sit comfortably? 30 min    How long can you stand comfortably? 10 mn with decr weight   How long can you walk comfortably? 300 feet   Patient Stated Goals He wants to bea ble to walke and stand without pain. and move knee freely.    Currently in Pain? Yes   Pain Score 8   Going from 8-10 pain   Pain Location Knee   Pain Orientation Right   Pain Descriptors / Indicators Aching;Sharp;Heaviness  unstable   Pain Type Surgical pain   Pain Onset 1 to 4 weeks ago   Pain Frequency Constant   Aggravating Factors  weight bearing.     Pain Relieving Factors meds .    Multiple Pain Sites No            [facility] PT Assessment - 07/17/16 0001      Assessment   Medical Diagnosis Post scope with PLM, chondroplasty ,    Referring Provider Ephriam Jenkins, MD   Onset Date/Surgical Date 06/21/16   Next MD Visit Not scheduled yet   Prior Therapy None     Precautions   Precautions None     Restrictions   Weight Bearing Restrictions Yes   RLE Weight Bearing Weight bearing as tolerated     Balance Screen   Has the patient fallen in the past 6 months No   Has the patient had a decrease in activity level because of a fear  of falling?  Yes  due to surgery   Is the patient reluctant to leave their home because of a fear of falling?  No     Prior Function   Level of Independence Needs assistance with homemaking   Vocation Full time employment   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     Cognition   Overall Cognitive Status Within Functional Limits for tasks assessed     Observation/Other Assessments   Focus on Therapeutic Outcomes (FOTO)  68% limited      ROM / Strength   AROM / PROM / Strength AROM;PROM;Strength     AROM   AROM Assessment Site Knee   Right/Left Knee Right;Left   Right Knee Extension -25   Right Knee Flexion 132   Left Knee Extension 0   Left Knee Flexion 145     PROM    PROM Assessment Site Knee   Right/Left Knee Right   Right Knee Extension 0   Right Knee Flexion 138     Strength   Overall Strength Comments RT hip 4/5 abduction 4+/5 extension (equal LT)    Strength Assessment Site Knee   Right/Left Knee Right;Left   Right Knee Flexion 4-/5   Right Knee Extension 3-/5   Left Knee Flexion 5/5   Left Knee Extension 5/5     Flexibility   Soft Tissue Assessment /Muscle Length yes   Hamstrings RT 60 degrees LT 65 degres     Palpation   Patella mobility normal    Palpation comment tender posterior and anterior knee and along joint line/patella              mild edema aound patella and posterior RT kne. a at            Objective measurements completed on examination: See above findings.    Manual : taped 2 Y's around patella and 2 strips posterior thigh to calf              PT Education - 07/17/16 1020    Education provided Yes   Education Details POC , HEP   Person(s) Educated Patient   Methods Explanation;Verbal cues;Handout   Comprehension Verbalized understanding;Returned demonstration          PT Short Term Goals - 07/17/16 1007      PT SHORT TERM GOAL #1   Title He will be independent with initial HEP    Time 4   Period Weeks   Status New     PT SHORT TERM GOAL #2   Title He will report pain decrasded to max 5/10 with weight bearing activity   Time 4   Status New     PT SHORT TERM GOAL #3   Title He will be able to sit with knee flexed for 45-60 min   Time 4   Period Weeks   Status New     PT SHORT TERM GOAL #4   Title He will be able to walk a 1/4 mile with 3-5 max pain   Time 4   Period Weeks   Status New     PT SHORT TERM GOAL #5   Title He will return to all normal home tasks with 4-5 max pain   Time 4   Period Weeks   Status New     PT SHORT TERM GOAL #6   Title FOTO score decreased to 50 % limited or less   Time 4   Period Weeks  Status New           PT Long Term Goals - 07/17/16  0945      PT LONG TERM GOAL #1   Title He will be independent with all HEP issued.    Time 12   Period Weeks   Status New     PT LONG TERM GOAL #2   Title He will improve FOTO score to < 40 % limited   Time 12   Status New     PT LONG TERM GOAL #3   Title Active ROM will equal Lt    Time 12   Period Weeks   Status New     PT LONG TERM GOAL #4   Title He will walk with 2-3 mac pain all activity    Time 12   Period Weeks   Status New     PT LONG TERM GOAL #5   Title He will RTW full duty   Time 12   Period Weeks   Status New     Additional Long Term Goals   Additional Long Term Goals Yes     PT LONG TERM GOAL #6   Title He will return to all normal home tasks with 2-3 max pain     PT LONG TERM GOAL #7   Title RT leg strength will be 5-/5  to allow return to all normal activity   Time 12   Period Weeks   Status New     PT LONG TERM GOAL #8   Title he will walk stairs step over step one rail safely x 12-16 steps   Time 12   Period Weeks   Status New                Plan - 07/17/16 1021    Clinical Impression Statement MR Drollinger present post scope with pain , stiffness , mild edema, weakness limiting mobility mostly due to pain. He has had a previous surgery on the same knee.   He reports very high painlevels but can walk without device and tolerated firnm palpaiton without indication of incr pain   Clinical Presentation Stable   Clinical Decision Making Low   Rehab Potential Good   PT Frequency 2x / week   PT Duration 12 weeks   PT Treatment/Interventions Cryotherapy;Electrical Stimulation;Functional mobility training;Stair training;Gait training;Passive range of motion;Patient/family education;Manual techniques;Taping;Therapeutic exercise;Therapeutic activities   PT Next Visit Plan Review HEP and add as able , measure ROM  retape if helpful.    PT Home Exercise Plan SLE 3 way, QS, ROM knee flexion   Consulted and Agree with Plan of Care Patient       Patient will benefit from skilled therapeutic intervention in order to improve the following deficits and impairments:  Decreased range of motion, Difficulty walking, Pain, Decreased activity tolerance, Decreased strength, Increased edema  Visit Diagnosis: S/P right knee arthroscopy  Acute pain of right knee  Stiffness of right knee, not elsewhere classified  Muscle weakness (generalized)  Localized edema  Difficulty in walking, not elsewhere classified     Problem List Patient Active Problem List   Diagnosis Date Noted  . Complex tear of medial meniscus of right knee as current injury 06/15/2016  . Unilateral primary osteoarthritis, right knee 06/15/2016  . Closed fracture of right tibial plateau 06/15/2016  . Palpitations 05/10/2016  . Transaminitis 05/10/2016  . Right knee pain 04/20/2016  . Health maintenance examination 11/27/2013  . Diabetes mellitus type 2, controlled,  without complications ([place]) 62/37/6283  . Frequent urination 11/27/2013  . Premature ejaculation 11/27/2013  . Lateral meniscal tear 11/27/2013  . HLD (hyperlipidemia) 06/13/2013  . Fatigue 12/10/2012    Darrel Hoover PT 07/17/2016, 10:25 AM  [facility] [address] [place], [place], [address] Phone: [phone]   Fax:  [phone]  Name: NABOR THOMANN MRN: [id number] Date of Birth: 08/23/1970

## 2016-07-17 NOTE — Patient Instructions (Signed)
Wear time of tape 3 days with removal if skin irritated. Showering ok.   Quad sets , SLR supine sidelye prone, knee flexion stretch,  3x/day  Stretching 30 sec or more 2-3 reps and strength 10-20 reps hold 5-10 sec

## 2016-07-24 ENCOUNTER — Telehealth: Payer: Self-pay

## 2016-07-24 ENCOUNTER — Ambulatory Visit: Payer: BLUE CROSS/BLUE SHIELD

## 2016-07-24 NOTE — Telephone Encounter (Signed)
Message left for Mr Gary Long that he missed his 10:15 appointment today and he had an appointment 7/19 at 11:45 Am and for him to call if he could not come to that appoinment

## 2016-07-26 ENCOUNTER — Other Ambulatory Visit: Payer: Self-pay | Admitting: Family Medicine

## 2016-07-27 ENCOUNTER — Ambulatory Visit: Payer: BLUE CROSS/BLUE SHIELD

## 2016-07-27 DIAGNOSIS — M6281 Muscle weakness (generalized): Secondary | ICD-10-CM | POA: Diagnosis not present

## 2016-07-27 DIAGNOSIS — R262 Difficulty in walking, not elsewhere classified: Secondary | ICD-10-CM | POA: Diagnosis not present

## 2016-07-27 DIAGNOSIS — R600 Localized edema: Secondary | ICD-10-CM

## 2016-07-27 DIAGNOSIS — M25661 Stiffness of right knee, not elsewhere classified: Secondary | ICD-10-CM | POA: Diagnosis not present

## 2016-07-27 DIAGNOSIS — Z9889 Other specified postprocedural states: Secondary | ICD-10-CM | POA: Diagnosis not present

## 2016-07-27 DIAGNOSIS — M25561 Pain in right knee: Secondary | ICD-10-CM

## 2016-07-27 NOTE — Therapy (Signed)
[place], [place], [address] Phone: [phone]   Fax:  [phone]  Physical Therapy Treatment  Patient Details  Name: Gary Long MRN: [id number] Date of Birth: 14-Oct-1970 Referring Provider: Ephriam Jenkins, MD  Encounter Date: 07/27/2016      PT End of Session - 07/27/16 1134    Visit Number 2   Number of Visits 24   Date for PT Re-Evaluation 10/06/16   Authorization Type BCBS   PT Start Time 1140   PT Stop Time 1230   PT Time Calculation (min) 50 min   Activity Tolerance Patient tolerated treatment well;No increased pain   Behavior During Therapy [facility] for tasks assessed/performed      Past Medical History:  Diagnosis Date  . History of malaria 1999  . Hyperlipidemia     Past Surgical History:  Procedure Laterality Date  . KNEE ARTHROSCOPY Right 2002   ?ACL repair  . KNEE ARTHROSCOPY WITH DRILLING/MICROFRACTURE Right 06/21/2016   Procedure: KNEE ARTHROSCOPY WITH DRILLING/MICROFRACTURE;  Surgeon: Leandrew Koyanagi, MD;  Location: [place];  Service: Orthopedics;  Laterality: Right;  . KNEE ARTHROSCOPY WITH MEDIAL MENISECTOMY Right 06/21/2016   Procedure: RIGHT KNEE ARTHROSCOPY WITH PARTIAL MEDIAL AND LATERAL MENISCECTOMIES, SYNOVECTOMY,  MICROFRACTURE, SUBCHONDROPLASTY;  Surgeon: Leandrew Koyanagi, MD;  Location: [facility];  Service: Orthopedics;  Laterality: Right;  . KNEE ARTHROSCOPY WITH SUBCHONDROPLASTY Right 06/21/2016   Procedure: KNEE ARTHROSCOPY WITH SUBCHONDROPLASTY;  Surgeon: Leandrew Koyanagi, MD;  Location: [facility];  Service: Orthopedics;  Laterality: Right;    There were no vitals filed for this visit.      Subjective Assessment - 07/27/16 1137    Currently in Pain? Yes   Pain Score 5    Pain Location Knee   Pain Orientation Right   Pain Descriptors / Indicators Aching;Sharp   Pain Type Surgical pain   Pain Onset 1 to 4 weeks ago   Pain  Frequency Constant   Multiple Pain Sites No            [facility] PT Assessment - 07/27/16 0001      AROM   Right Knee Extension -15                     [facility] Adult PT Treatment/Exercise - 07/27/16 0001      Exercises   Exercises Knee/Hip     Knee/Hip Exercises: Aerobic   Stationary Bike 5 min L1     Knee/Hip Exercises: Standing   Heel Raises Both;15 reps     Knee/Hip Exercises: Supine   Quad Sets Right;15 reps   5 sec    Quad Sets Limitations 5 sec   Short Arc Quad Sets Right;1 set;15 reps   Short Arc [employer] Limitations 3 pounds   Straight Leg Raises Right;20 reps   Straight Leg Raises Limitations cued to QS and keep knee straight     Knee/Hip Exercises: Prone   Hamstring Curl 20 reps   Hamstring Curl Limitations 3 pounds   Hip Extension Right;20 reps   Hip Extension Limitations 3 pounds     Modalities   Modalities Cryotherapy     Cryotherapy   Number Minutes Cryotherapy 12 Minutes   Cryotherapy Location Knee  RT   Type of Cryotherapy Ice pack                  PT Short Term Goals - 07/27/16 1219  PT SHORT TERM GOAL #1   Title He will be independent with initial HEP    Status Achieved     PT SHORT TERM GOAL #2   Title He will report pain decrasded to max 5/10 with weight bearing activity   Status On-going     PT SHORT TERM GOAL #3   Title He will be able to sit with knee flexed for 45-60 min   Status On-going     PT SHORT TERM GOAL #4   Title He will be able to walk a 1/4 mile with 3-5 max pain   Status On-going     PT SHORT TERM GOAL #5   Status On-going     PT SHORT TERM GOAL #6   Title FOTO score decreased to 50 % limited or less   Status Unable to assess           PT Long Term Goals - 07/17/16 0945      PT LONG TERM GOAL #1   Title He will be independent with all HEP issued.    Time 12   Period Weeks   Status New     PT LONG TERM GOAL #2   Title He will improve FOTO score to < 40 % limited   Time  12   Status New     PT LONG TERM GOAL #3   Title Active ROM will equal Lt    Time 12   Period Weeks   Status New     PT LONG TERM GOAL #4   Title He will walk with 2-3 mac pain all activity    Time 12   Period Weeks   Status New     PT LONG TERM GOAL #5   Title He will RTW full duty   Time 12   Period Weeks   Status New     Additional Long Term Goals   Additional Long Term Goals Yes     PT LONG TERM GOAL #6   Title He will return to all normal home tasks with 2-3 max pain     PT LONG TERM GOAL #7   Title RT leg strength will be 5-/5  to allow return to all normal activity   Time 12   Period Weeks   Status New     PT LONG TERM GOAL #8   Title he will walk stairs step over step one rail safely x 12-16 steps   Time 12   Period Weeks   Status New               Plan - 07/27/16 1134    Clinical Impression Statement Mild incr soreness eased with cold pack at end of session .    ROM improved        tolerated exer with weight and bike . He was able to return demo HEP   PT Treatment/Interventions Cryotherapy;Electrical Stimulation;Functional mobility training;Stair training;Gait training;Passive range of motion;Patient/family education;Manual techniques;Taping;Therapeutic exercise;Therapeutic activities   PT Next Visit Plan HEP  add as able , measure ROM.   pain as uide for limits with time of reps/weight   PT Home Exercise Plan SLE 3 way, QS, ROM knee flexion   Consulted and Agree with Plan of Care Patient      Patient will benefit from skilled therapeutic intervention in order to improve the following deficits and impairments:  Decreased range of motion, Difficulty walking, Pain, Decreased activity tolerance, Decreased strength, Increased edema  Visit Diagnosis:  S/P right knee arthroscopy  Acute pain of right knee  Muscle weakness (generalized)  Localized edema  Stiffness of right knee, not elsewhere classified  Difficulty in walking, not elsewhere  classified     Problem List Patient Active Problem List   Diagnosis Date Noted  . Complex tear of medial meniscus of right knee as current injury 06/15/2016  . Unilateral primary osteoarthritis, right knee 06/15/2016  . Closed fracture of right tibial plateau 06/15/2016  . Palpitations 05/10/2016  . Transaminitis 05/10/2016  . Right knee pain 04/20/2016  . Health maintenance examination 11/27/2013  . Diabetes mellitus type 2, controlled, without complications ([place]) 52/77/8242  . Frequent urination 11/27/2013  . Premature ejaculation 11/27/2013  . Lateral meniscal tear 11/27/2013  . HLD (hyperlipidemia) 06/13/2013  . Fatigue 12/10/2012    Darrel Hoover  PT 07/27/2016, 12:20 PM  [facility] [address] [place], [place], [address] Phone: [phone]   Fax:  [phone]  Name: TREVEN HOLTMAN MRN: [id number] Date of Birth: 06-21-1970

## 2016-07-31 ENCOUNTER — Ambulatory Visit: Payer: BLUE CROSS/BLUE SHIELD

## 2016-08-01 ENCOUNTER — Ambulatory Visit: Payer: BLUE CROSS/BLUE SHIELD | Admitting: Physical Therapy

## 2016-08-01 ENCOUNTER — Encounter: Payer: Self-pay | Admitting: Physical Therapy

## 2016-08-01 DIAGNOSIS — M25561 Pain in right knee: Secondary | ICD-10-CM

## 2016-08-01 DIAGNOSIS — R600 Localized edema: Secondary | ICD-10-CM

## 2016-08-01 DIAGNOSIS — M25661 Stiffness of right knee, not elsewhere classified: Secondary | ICD-10-CM

## 2016-08-01 DIAGNOSIS — M6281 Muscle weakness (generalized): Secondary | ICD-10-CM

## 2016-08-01 DIAGNOSIS — R262 Difficulty in walking, not elsewhere classified: Secondary | ICD-10-CM | POA: Diagnosis not present

## 2016-08-01 DIAGNOSIS — Z9889 Other specified postprocedural states: Secondary | ICD-10-CM | POA: Diagnosis not present

## 2016-08-01 NOTE — Therapy (Signed)
[place], [place], [address] Phone: [phone]   Fax:  [phone]  Physical Therapy Treatment  Patient Details  Name: Gary Long MRN: [id number] Date of Birth: [birth year] Referring Provider: Ephriam Jenkins, MD  Encounter Date: 08/01/2016      PT End of Session - 08/01/16 1256    Visit Number 3   Number of Visits 24   Date for PT Re-Evaluation 10/06/16   PT Start Time 1150   PT Stop Time 1240   PT Time Calculation (min) 50 min   Activity Tolerance Patient tolerated treatment well   Behavior During Therapy [facility] for tasks assessed/performed      Past Medical History:  Diagnosis Date  . History of malaria 1999  . Hyperlipidemia     Past Surgical History:  Procedure Laterality Date  . KNEE ARTHROSCOPY Right 2002   ?ACL repair  . KNEE ARTHROSCOPY WITH DRILLING/MICROFRACTURE Right 06/21/2016   Procedure: KNEE ARTHROSCOPY WITH DRILLING/MICROFRACTURE;  Surgeon: Leandrew Koyanagi, MD;  Location: [place];  Service: Orthopedics;  Laterality: Right;  . KNEE ARTHROSCOPY WITH MEDIAL MENISECTOMY Right 06/21/2016   Procedure: RIGHT KNEE ARTHROSCOPY WITH PARTIAL MEDIAL AND LATERAL MENISCECTOMIES, SYNOVECTOMY,  MICROFRACTURE, SUBCHONDROPLASTY;  Surgeon: Leandrew Koyanagi, MD;  Location: [place];  Service: Orthopedics;  Laterality: Right;  . KNEE ARTHROSCOPY WITH SUBCHONDROPLASTY Right 06/21/2016   Procedure: KNEE ARTHROSCOPY WITH SUBCHONDROPLASTY;  Surgeon: Leandrew Koyanagi, MD;  Location: [place];  Service: Orthopedics;  Laterality: Right;    There were no vitals filed for this visit.      Subjective Assessment - 08/01/16 1157    Subjective Tries not to put weight on leg on steps.Not yet doing 30  reps with HEP.  He exerciese all the time.  No longer uses ice or pain meds.   Currently in Pain? Yes   Pain Score 5   up to 6/10   Pain Location Knee   Pain Orientation  Right   Pain Descriptors / Indicators Aching;Sharp  inside knee locke with straightening.    Pain Type Surgical pain   Pain Frequency Intermittent   Aggravating Factors  Bending fullly   Pain Relieving Factors change of positions.  elevate   Multiple Pain Sites Yes                         [facility] Adult PT Treatment/Exercise - 08/01/16 0001      Knee/Hip Exercises: Stretches   Gastroc Stretch 1 rep;30 seconds   Gastroc Stretch Limitations incline   Soleus Stretch 1 rep;30 seconds   Soleus Stretch Limitations incline board     Knee/Hip Exercises: Aerobic   Stationary Bike 5 minutes  unable to keep it turned on.     Knee/Hip Exercises: Standing   Terminal Knee Extension Limitations Prone, 10 x cues min assist to decrease comprnsations at hips  shakes   Gait Training on level cued for smaller steps, weight shifting   Other Standing Knee Exercises pre gait  wall slides facing wall 5-10 reps right,  5 reps left,  heavy cue initially     Knee/Hip Exercises: Supine   Quad Sets 15 reps   Quad Sets Limitations 5   Short Arc Quad Sets Right;1 set;15 reps   Short Arc Quad Sets Limitations 3 LBS shakey   Straight Leg Raises 10 reps;2 sets   Straight Leg Raises Limitations slight quad lag.  cues  QS     Moist Heat Therapy   Number Minutes Moist Heat 15 Minutes  He als requested 1 for his face, I declined   Moist Heat Location --  posterior right, anterior left knee.       Manual Therapy   Manual Therapy Soft tissue mobilization   Manual therapy comments also scar tissue mobilization   Soft tissue mobilization distal hamstrings  and lateral distal thigh                PT Education - 08/01/16 1256    Education provided Yes   Education Details gait   Person(s) Educated Patient   Methods Explanation;Demonstration;Verbal cues   Comprehension Verbalized understanding;Returned demonstration          PT Short Term Goals - 08/01/16 1259      PT SHORT TERM  GOAL #1   Title He will be independent with initial HEP    Time 4   Period Weeks   Status Achieved     PT SHORT TERM GOAL #2   Title He will report pain decrasded to max 5/10 with weight bearing activity   Baseline 6/10   Time 4   Period Weeks   Status On-going     PT SHORT TERM GOAL #3   Title He will be able to sit with knee flexed for 45-60 min   Time 4   Period Weeks   Status Unable to assess     PT SHORT TERM GOAL #4   Title He will be able to walk a 1/4 mile with 3-5 max pain   Time 4   Period Weeks   Status Unable to assess     PT SHORT TERM GOAL #5   Title He will return to all normal home tasks with 4-5 max pain   Baseline 6/10 pain yesterday,  not sure if back to all normal tasks   Time 4   Period Weeks   Status On-going     PT SHORT TERM GOAL #6   Title FOTO score decreased to 50 % limited or less   Time 4   Period Weeks   Status Unable to assess           PT Long Term Goals - 07/17/16 0945      PT LONG TERM GOAL #1   Title He will be independent with all HEP issued.    Time 12   Period Weeks   Status New     PT LONG TERM GOAL #2   Title He will improve FOTO score to < 40 % limited   Time 12   Status New     PT LONG TERM GOAL #3   Title Active ROM will equal Lt    Time 12   Period Weeks   Status New     PT LONG TERM GOAL #4   Title He will walk with 2-3 mac pain all activity    Time 12   Period Weeks   Status New     PT LONG TERM GOAL #5   Title He will RTW full duty   Time 12   Period Weeks   Status New     Additional Long Term Goals   Additional Long Term Goals Yes     PT LONG TERM GOAL #6   Title He will return to all normal home tasks with 2-3 max pain     PT LONG TERM GOAL #7   Title RT  leg strength will be 5-/5  to allow return to all normal activity   Time 12   Period Weeks   Status New     PT LONG TERM GOAL #8   Title he will walk stairs step over step one rail safely x 12-16 steps   Time 12   Period Weeks    Status New               Plan - 08/01/16 1257    Clinical Impression Statement Pain 5/10 post session.  AROM -5 to 136 right knee.  Less antalgic gait post session.  Patient stands at times with his knees locked into extension vs use his quads, (Both)   PT Treatment/Interventions Cryotherapy;Electrical Stimulation;Functional mobility training;Stair training;Gait training;Passive range of motion;Patient/family education;Manual techniques;Taping;Therapeutic exercise;Therapeutic activities   PT Next Visit Plan HEP  add as able , measure ROM.   pain as uide for limits with time of reps/weight.   wall slides facing wall(Pre gait)   PT Home Exercise Plan SLE 3 way, QS, ROM knee flexion   Consulted and Agree with Plan of Care Patient      Patient will benefit from skilled therapeutic intervention in order to improve the following deficits and impairments:  Decreased range of motion, Difficulty walking, Pain, Decreased activity tolerance, Decreased strength, Increased edema  Visit Diagnosis: S/P right knee arthroscopy  Acute pain of right knee  Muscle weakness (generalized)  Localized edema  Stiffness of right knee, not elsewhere classified  Difficulty in walking, not elsewhere classified     Problem List Patient Active Problem List   Diagnosis Date Noted  . Complex tear of medial meniscus of right knee as current injury 06/15/2016  . Unilateral primary osteoarthritis, right knee 06/15/2016  . Closed fracture of right tibial plateau 06/15/2016  . Palpitations 05/10/2016  . Transaminitis 05/10/2016  . Right knee pain 04/20/2016  . Health maintenance examination 11/27/2013  . Diabetes mellitus type 2, controlled, without complications ([place]) 15/40/0867  . Frequent urination 11/27/2013  . Premature ejaculation 11/27/2013  . Lateral meniscal tear 11/27/2013  . HLD (hyperlipidemia) 06/13/2013  . Fatigue 12/10/2012    HARRIS,KAREN PTA 08/01/2016, 1:02 PM  [facility] [address] [place], [place], [address] Phone: [phone]   Fax:  [phone]  Name: RAIN FRIEDT MRN: [id number] Date of Birth: 05/30/1970

## 2016-08-14 ENCOUNTER — Ambulatory Visit: Payer: BLUE CROSS/BLUE SHIELD | Admitting: Physical Therapy

## 2016-08-15 ENCOUNTER — Ambulatory Visit
Payer: BLUE CROSS/BLUE SHIELD | Attending: Orthopaedic Surgery | Admitting: Rehabilitative and Restorative Service Providers"

## 2016-08-15 ENCOUNTER — Ambulatory Visit (INDEPENDENT_AMBULATORY_CARE_PROVIDER_SITE_OTHER): Payer: BLUE CROSS/BLUE SHIELD | Admitting: Orthopaedic Surgery

## 2016-08-15 ENCOUNTER — Encounter (INDEPENDENT_AMBULATORY_CARE_PROVIDER_SITE_OTHER): Payer: Self-pay | Admitting: Orthopaedic Surgery

## 2016-08-15 ENCOUNTER — Telehealth (INDEPENDENT_AMBULATORY_CARE_PROVIDER_SITE_OTHER): Payer: Self-pay | Admitting: *Deleted

## 2016-08-15 DIAGNOSIS — S83231A Complex tear of medial meniscus, current injury, right knee, initial encounter: Secondary | ICD-10-CM

## 2016-08-15 DIAGNOSIS — M25561 Pain in right knee: Secondary | ICD-10-CM | POA: Insufficient documentation

## 2016-08-15 DIAGNOSIS — R262 Difficulty in walking, not elsewhere classified: Secondary | ICD-10-CM | POA: Insufficient documentation

## 2016-08-15 DIAGNOSIS — S83281D Other tear of lateral meniscus, current injury, right knee, subsequent encounter: Secondary | ICD-10-CM

## 2016-08-15 DIAGNOSIS — S82141A Displaced bicondylar fracture of right tibia, initial encounter for closed fracture: Secondary | ICD-10-CM

## 2016-08-15 DIAGNOSIS — Z9889 Other specified postprocedural states: Secondary | ICD-10-CM | POA: Insufficient documentation

## 2016-08-15 DIAGNOSIS — M6281 Muscle weakness (generalized): Secondary | ICD-10-CM | POA: Insufficient documentation

## 2016-08-15 DIAGNOSIS — R600 Localized edema: Secondary | ICD-10-CM | POA: Insufficient documentation

## 2016-08-15 DIAGNOSIS — M25661 Stiffness of right knee, not elsewhere classified: Secondary | ICD-10-CM | POA: Insufficient documentation

## 2016-08-15 NOTE — Progress Notes (Signed)
Patient comes back today for posterior knee pain and calf pain. He does have some bruising in this region. It is tender to palpation. Denies any chest pain or shortness of breath. I do not appreciate any swelling in his foot or ankle region. The foot is warm well-perfused with bounding pulses. At this point that he get an urgent Doppler to rule out DVT. Questions encouraged and answered. We will be in touch with the patient about the result.

## 2016-08-15 NOTE — Addendum Note (Signed)
Addended by: Albertina ParrGARCIA, Eloy Fehl on: 08/15/2016 10:12 AM   Modules accepted: Orders

## 2016-08-15 NOTE — Telephone Encounter (Signed)
Pt is scheduled for VASCULAR US on 08/16/16 at 10am, [facility] at 945am to register, [facility]

## 2016-08-15 NOTE — Therapy (Signed)
[place], [place], [address] Phone: [phone]   Fax:  [phone]  Physical Therapy Treatment  Patient Details  Name: LISSANDRO DILORENZO MRN: [id number] Date of Birth: 1970/08/05 Referring Provider: Ephriam Jenkins, MD  Encounter Date: 08/15/2016      PT End of Session - 08/15/16 0815    Visit Number 4   Number of Visits 24   Date for PT Re-Evaluation 10/06/16   Authorization Type BCBS   PT Start Time 0805   PT Stop Time 0848   PT Time Calculation (min) 43 min   Activity Tolerance Patient tolerated treatment well;Patient limited by pain   Behavior During Therapy [facility] for tasks assessed/performed      Past Medical History:  Diagnosis Date  . History of malaria 1999  . Hyperlipidemia     Past Surgical History:  Procedure Laterality Date  . KNEE ARTHROSCOPY Right 2002   ?ACL repair  . KNEE ARTHROSCOPY WITH DRILLING/MICROFRACTURE Right 06/21/2016   Procedure: KNEE ARTHROSCOPY WITH DRILLING/MICROFRACTURE;  Surgeon: Leandrew Koyanagi, MD;  Location: [place];  Service: Orthopedics;  Laterality: Right;  . KNEE ARTHROSCOPY WITH MEDIAL MENISECTOMY Right 06/21/2016   Procedure: RIGHT KNEE ARTHROSCOPY WITH PARTIAL MEDIAL AND LATERAL MENISCECTOMIES, SYNOVECTOMY,  MICROFRACTURE, SUBCHONDROPLASTY;  Surgeon: Leandrew Koyanagi, MD;  Location: [place];  Service: Orthopedics;  Laterality: Right;  . KNEE ARTHROSCOPY WITH SUBCHONDROPLASTY Right 06/21/2016   Procedure: KNEE ARTHROSCOPY WITH SUBCHONDROPLASTY;  Surgeon: Leandrew Koyanagi, MD;  Location: [place];  Service: Orthopedics;  Laterality: Right;    There were no vitals filed for this visit.      Subjective Assessment - 08/15/16 0806    Subjective 8/10 pain R knee pain and increases when I stand on it a long time.    Pertinent History 2002 scope RT knee.    Limitations Walking;Standing;House hold activities   Patient Stated  Goals He wants to be able to walk and stand without pain and move knee freely.    Currently in Pain? Yes   Pain Score 8    Pain Location Knee   Pain Orientation Right   Pain Descriptors / Indicators Aching   Pain Type Surgical pain   Pain Onset More than a month ago   Pain Frequency Intermittent   Aggravating Factors  standing on it for too long   Pain Relieving Factors rest   Multiple Pain Sites No                         [facility] Adult PT Treatment/Exercise - 08/15/16 0001      Knee/Hip Exercises: Supine   Straight Leg Raises Strengthening;Right;3 sets;20 reps;Limitations  slight extension lag   Other Supine Knee/Hip Exercises R Gastroc stretch with towel 2x30 sec; pt with mild swelling medial joint line;; VMO SLR x 20 R; SLR hip circles counterclockwise R x 20; pt needed max verbal and visual cues to perform all correctly without extension lag; R SAQ 3 lbs x 20 with ankle DF; R SLR/hip abdct limited AROM x 20     Knee/Hip Exercises: Prone   Hamstring Curl 10 reps   Hamstring Curl Limitations 3 lbs R                  PT Short Term Goals - 08/15/16 0818      PT SHORT TERM GOAL #2   Title He will report pain decrasded to  max 5/10 with weight bearing activity   Baseline 8/10   Time 4   Period Weeks   Status On-going     PT SHORT TERM GOAL #3   Title He will be able to sit with knee flexed for 45-60 min   Time 4   Period Weeks   Status Achieved     PT SHORT TERM GOAL #4   Title He will be able to walk a 1/4 mile with 3-5 max pain   Time 4   Period Weeks   Status Achieved     PT SHORT TERM GOAL #5   Title He will return to all normal home tasks with 4-5 max pain   Baseline up to 8/10   Time 4   Period Weeks   Status On-going     PT SHORT TERM GOAL #6   Title FOTO score decreased to 50 % limited or less   Time 4   Period Weeks   Status Unable to assess           PT Long Term Goals - 07/17/16 0945      PT LONG TERM GOAL #1    Title He will be independent with all HEP issued.    Time 12   Period Weeks   Status New     PT LONG TERM GOAL #2   Title He will improve FOTO score to < 40 % limited   Time 12   Status New     PT LONG TERM GOAL #3   Title Active ROM will equal Lt    Time 12   Period Weeks   Status New     PT LONG TERM GOAL #4   Title He will walk with 2-3 mac pain all activity    Time 12   Period Weeks   Status New     PT LONG TERM GOAL #5   Title He will RTW full duty   Time 12   Period Weeks   Status New     Additional Long Term Goals   Additional Long Term Goals Yes     PT LONG TERM GOAL #6   Title He will return to all normal home tasks with 2-3 max pain     PT LONG TERM GOAL #7   Title RT leg strength will be 5-/5  to allow return to all normal activity   Time 12   Period Weeks   Status New     PT LONG TERM GOAL #8   Title he will walk stairs step over step one rail safely x 12-16 steps   Time 12   Period Weeks   Status New               Plan - 08/15/16 0815    Clinical Impression Statement Pt presents to PT with R Quad weakness and extension lag. With SLR, pt with noticeable quiver with increasing reps. Discussed at this point in his rehab, he should be able to perform SLR without minimal to no extension lag. He reports non-compliance with consistent HEP performance; PT discussed importance of compliance in order to maximize benefit and reduce pain. Pt would benefit from further PT for R LE strengthening and pain control to improve function and decrease pain. Upon turning prone, pt shown to have slight discoloration R popliteal fossa extending to proximal calf, no warmth, slight swelling and tenderness compared to contralateral side. Referred pt to MD for further assessment due to symptoms  and + [place].    Rehab Potential Good   PT Frequency 2x / week   PT Duration 12 weeks   PT Treatment/Interventions Cryotherapy;Electrical Stimulation;Functional mobility  training;Stair training;Gait training;Passive range of motion;Patient/family education;Manual techniques;Taping;Therapeutic exercise;Therapeutic activities   PT Next Visit Plan continue to progress HEP; continue to stress quad strengthening due to extension lag and pt inability to maintain knee extension with multidirectional quad exercises, quad proprioceptive exercises due to pt inability to know when his knee is bending with exercise; advised pt to followup with MD since he has not had a followup visit in 4 weeks esp due to medial knee pain and swelling   PT Home Exercise Plan SLR 3 way, QS, ROM knee flexion   Consulted and Agree with Plan of Care Patient      Patient will benefit from skilled therapeutic intervention in order to improve the following deficits and impairments:  Decreased range of motion, Difficulty walking, Pain, Decreased activity tolerance, Decreased strength, Increased edema  Visit Diagnosis: S/P right knee arthroscopy  Acute pain of right knee  Muscle weakness (generalized)  Localized edema  Stiffness of right knee, not elsewhere classified  Difficulty in walking, not elsewhere classified     Problem List Patient Active Problem List   Diagnosis Date Noted  . Complex tear of medial meniscus of right knee as current injury 06/15/2016  . Unilateral primary osteoarthritis, right knee 06/15/2016  . Closed fracture of right tibial plateau 06/15/2016  . Palpitations 05/10/2016  . Transaminitis 05/10/2016  . Right knee pain 04/20/2016  . Health maintenance examination 11/27/2013  . Diabetes mellitus type 2, controlled, without complications ([place]) 94/76/5465  . Frequent urination 11/27/2013  . Premature ejaculation 11/27/2013  . Lateral meniscal tear 11/27/2013  . HLD (hyperlipidemia) 06/13/2013  . Fatigue 12/10/2012    Jonni Oelkers, PT 08/15/2016, 8:50 AM  [facility] [address] [place], [place],  [address] Phone: [phone]   Fax:  [phone]  Name: ARIEL WINGROVE MRN: [id number] Date of Birth: 1970/03/26

## 2016-08-16 ENCOUNTER — Ambulatory Visit (HOSPITAL_COMMUNITY): Admission: RE | Admit: 2016-08-16 | Payer: BLUE CROSS/BLUE SHIELD | Source: Ambulatory Visit

## 2016-08-16 ENCOUNTER — Encounter: Payer: BLUE CROSS/BLUE SHIELD | Admitting: Physical Therapy

## 2016-08-17 ENCOUNTER — Ambulatory Visit (HOSPITAL_COMMUNITY)
Admission: RE | Admit: 2016-08-17 | Discharge: 2016-08-17 | Disposition: A | Discharge status: Home / Self Care | Payer: BLUE CROSS/BLUE SHIELD | Source: Ambulatory Visit | Attending: Orthopaedic Surgery | Admitting: Orthopaedic Surgery

## 2016-08-17 DIAGNOSIS — X58XXXA Exposure to other specified factors, initial encounter: Secondary | ICD-10-CM | POA: Insufficient documentation

## 2016-08-17 DIAGNOSIS — S83231A Complex tear of medial meniscus, current injury, right knee, initial encounter: Secondary | ICD-10-CM | POA: Diagnosis not present

## 2016-08-17 DIAGNOSIS — S82141A Displaced bicondylar fracture of right tibia, initial encounter for closed fracture: Secondary | ICD-10-CM

## 2016-08-17 DIAGNOSIS — X58XXXD Exposure to other specified factors, subsequent encounter: Secondary | ICD-10-CM | POA: Diagnosis not present

## 2016-08-17 DIAGNOSIS — S83281D Other tear of lateral meniscus, current injury, right knee, subsequent encounter: Secondary | ICD-10-CM | POA: Insufficient documentation

## 2016-08-17 DIAGNOSIS — M7121 Synovial cyst of popliteal space [Baker], right knee: Secondary | ICD-10-CM | POA: Insufficient documentation

## 2016-08-17 NOTE — Progress Notes (Signed)
*  Preliminary Results* Right lower extremity venous duplex completed. Right lower extremity is negative for deep vein thrombosis. There is evidence of a right Baker's cyst measuring 4.5cm.  08/17/2016 10:49 AM  Gertie FeyMichelle Janayia Burggraf, BS, RVT, RDCS, RDMS

## 2016-08-21 ENCOUNTER — Ambulatory Visit: Payer: BLUE CROSS/BLUE SHIELD

## 2016-08-21 ENCOUNTER — Encounter: Payer: BLUE CROSS/BLUE SHIELD | Admitting: Physical Therapy

## 2016-08-21 DIAGNOSIS — M6281 Muscle weakness (generalized): Secondary | ICD-10-CM

## 2016-08-21 DIAGNOSIS — Z9889 Other specified postprocedural states: Secondary | ICD-10-CM

## 2016-08-21 DIAGNOSIS — R262 Difficulty in walking, not elsewhere classified: Secondary | ICD-10-CM

## 2016-08-21 DIAGNOSIS — M25561 Pain in right knee: Secondary | ICD-10-CM

## 2016-08-21 DIAGNOSIS — M25661 Stiffness of right knee, not elsewhere classified: Secondary | ICD-10-CM

## 2016-08-21 DIAGNOSIS — R600 Localized edema: Secondary | ICD-10-CM

## 2016-08-21 NOTE — Therapy (Signed)
[place], [place], [address] Phone: [phone]   Fax:  [phone]  Physical Therapy Treatment  Patient Details  Name: Gary Long MRN: [id number] Date of Birth: 1970/03/17 Referring Provider: Ephriam Jenkins, MD  Encounter Date: 08/21/2016      PT End of Session - 08/21/16 1018    Visit Number 5   Number of Visits 24   Date for PT Re-Evaluation 10/06/16   Authorization Type BCBS   PT Start Time 1016   PT Stop Time 1100   PT Time Calculation (min) 44 min   Activity Tolerance Patient tolerated treatment well;Patient limited by pain   Behavior During Therapy [facility] for tasks assessed/performed      Past Medical History:  Diagnosis Date  . History of malaria 1999  . Hyperlipidemia     Past Surgical History:  Procedure Laterality Date  . KNEE ARTHROSCOPY Right 2002   ?ACL repair  . KNEE ARTHROSCOPY WITH DRILLING/MICROFRACTURE Right 06/21/2016   Procedure: KNEE ARTHROSCOPY WITH DRILLING/MICROFRACTURE;  Surgeon: Leandrew Koyanagi, MD;  Location: [place];  Service: Orthopedics;  Laterality: Right;  . KNEE ARTHROSCOPY WITH MEDIAL MENISECTOMY Right 06/21/2016   Procedure: RIGHT KNEE ARTHROSCOPY WITH PARTIAL MEDIAL AND LATERAL MENISCECTOMIES, SYNOVECTOMY,  MICROFRACTURE, SUBCHONDROPLASTY;  Surgeon: Leandrew Koyanagi, MD;  Location: [place];  Service: Orthopedics;  Laterality: Right;  . KNEE ARTHROSCOPY WITH SUBCHONDROPLASTY Right 06/21/2016   Procedure: KNEE ARTHROSCOPY WITH SUBCHONDROPLASTY;  Surgeon: Leandrew Koyanagi, MD;  Location: [place];  Service: Orthopedics;  Laterality: Right;    There were no vitals filed for this visit.      Subjective Assessment - 08/21/16 1021    Subjective RT knee 5/10, LT also sore from compensation. He reports [place] negative for clot but MD has not spoken to him yet   Currently in Pain? Yes   Pain Score 5    Pain Location Knee   Pain  Orientation Right   Pain Descriptors / Indicators Aching   Pain Type Surgical pain   Pain Onset More than a month ago   Aggravating Factors  weightbearing   Pain Relieving Factors rest            [facility] PT Assessment - 08/21/16 0001      AROM   Right Knee Extension -10   Right Knee Flexion 142                     [facility] Adult PT Treatment/Exercise - 08/21/16 0001      Knee/Hip Exercises: Stretches   Passive Hamstring Stretch Right;2 reps;30 seconds   Passive Hamstring Stretch Limitations strap   Gastroc Stretch 2 reps;30 seconds   Gastroc Stretch Limitations strap     Knee/Hip Exercises: Aerobic   Stationary Bike 5 minutes  able to maintain pace.      Knee/Hip Exercises: Supine   Quad Sets Right;10 reps   Short Arc Quad Sets Right;15 reps;2 sets;10 reps   Short Arc Quad Sets Limitations 5 pounds no shaking   Straight Leg Raises Strengthening;Right;2 sets;10 reps   Straight Leg Raises Limitations  about 10 degrees lag.      Knee/Hip Exercises: Sidelying   Hip ABduction Right;2 sets;10 reps     Knee/Hip Exercises: Prone   Hamstring Curl 20 reps   Hamstring Curl Limitations 5  RT   Hip Extension Right;20 reps       Spent time discussing surgery and  possible reasons for continued lateral knee pain.            PT Short Term Goals - 08/15/16 0818      PT SHORT TERM GOAL #2   Title He will report pain decrasded to max 5/10 with weight bearing activity   Baseline 8/10   Time 4   Period Weeks   Status On-going     PT SHORT TERM GOAL #3   Title He will be able to sit with knee flexed for 45-60 min   Time 4   Period Weeks   Status Achieved     PT SHORT TERM GOAL #4   Title He will be able to walk a 1/4 mile with 3-5 max pain   Time 4   Period Weeks   Status Achieved     PT SHORT TERM GOAL #5   Title He will return to all normal home tasks with 4-5 max pain   Baseline up to 8/10   Time 4   Period Weeks   Status On-going     PT  SHORT TERM GOAL #6   Title FOTO score decreased to 50 % limited or less   Time 4   Period Weeks   Status Unable to assess           PT Long Term Goals - 07/17/16 0945      PT LONG TERM GOAL #1   Title He will be independent with all HEP issued.    Time 12   Period Weeks   Status New     PT LONG TERM GOAL #2   Title He will improve FOTO score to < 40 % limited   Time 12   Status New     PT LONG TERM GOAL #3   Title Active ROM will equal Lt    Time 12   Period Weeks   Status New     PT LONG TERM GOAL #4   Title He will walk with 2-3 mac pain all activity    Time 12   Period Weeks   Status New     PT LONG TERM GOAL #5   Title He will RTW full duty   Time 12   Period Weeks   Status New     Additional Long Term Goals   Additional Long Term Goals Yes     PT LONG TERM GOAL #6   Title He will return to all normal home tasks with 2-3 max pain     PT LONG TERM GOAL #7   Title RT leg strength will be 5-/5  to allow return to all normal activity   Time 12   Period Weeks   Status New     PT LONG TERM GOAL #8   Title he will walk stairs step over step one rail safely x 12-16 steps   Time 12   Period Weeks   Status New               Plan - 08/21/16 1101    Clinical Impression Statement ROM and strength / contraction improved . He is progressing but continued pain is frustrating him. Declined modalities as he needed to be at work   PT Treatment/Interventions Cryotherapy;Electrical Stimulation;Functional mobility training;Stair training;Gait training;Passive range of motion;Patient/family education;Manual techniques;Taping;Therapeutic exercise;Therapeutic activities   PT Next Visit Plan Continue strength, modalities as needed   PT Home Exercise Plan SLR 3 way, QS, ROM knee flexion   Consulted and Agree  with Plan of Care Patient      Patient will benefit from skilled therapeutic intervention in order to improve the following deficits and impairments:   Decreased range of motion, Difficulty walking, Pain, Decreased activity tolerance, Decreased strength, Increased edema  Visit Diagnosis: S/P right knee arthroscopy  Acute pain of right knee  Muscle weakness (generalized)  Localized edema  Stiffness of right knee, not elsewhere classified  Difficulty in walking, not elsewhere classified     Problem List Patient Active Problem List   Diagnosis Date Noted  . Complex tear of medial meniscus of right knee as current injury 06/15/2016  . Unilateral primary osteoarthritis, right knee 06/15/2016  . Closed fracture of right tibial plateau 06/15/2016  . Palpitations 05/10/2016  . Transaminitis 05/10/2016  . Right knee pain 04/20/2016  . Health maintenance examination 11/27/2013  . Diabetes mellitus type 2, controlled, without complications ([place]) 22/97/9892  . Frequent urination 11/27/2013  . Premature ejaculation 11/27/2013  . Lateral meniscal tear 11/27/2013  . HLD (hyperlipidemia) 06/13/2013  . Fatigue 12/10/2012    Darrel Hoover  PT 08/21/2016, 11:02 AM  [facility] [address] [place], [place], [address] Phone: [phone]   Fax:  [phone]  Name: Gary Long MRN: [id number] Date of Birth: 03/16/1970

## 2016-08-24 ENCOUNTER — Encounter: Payer: BLUE CROSS/BLUE SHIELD | Admitting: Physical Therapy

## 2016-08-25 ENCOUNTER — Ambulatory Visit: Payer: BLUE CROSS/BLUE SHIELD | Admitting: Physical Therapy

## 2016-08-25 DIAGNOSIS — Z9889 Other specified postprocedural states: Secondary | ICD-10-CM

## 2016-08-25 DIAGNOSIS — R262 Difficulty in walking, not elsewhere classified: Secondary | ICD-10-CM

## 2016-08-25 DIAGNOSIS — M25661 Stiffness of right knee, not elsewhere classified: Secondary | ICD-10-CM

## 2016-08-25 DIAGNOSIS — M25561 Pain in right knee: Secondary | ICD-10-CM

## 2016-08-25 DIAGNOSIS — R600 Localized edema: Secondary | ICD-10-CM

## 2016-08-25 DIAGNOSIS — M6281 Muscle weakness (generalized): Secondary | ICD-10-CM

## 2016-08-25 NOTE — Therapy (Signed)
[place], [place], [address] Phone: [phone]   Fax:  [phone]  Physical Therapy Treatment  Patient Details  Name: Gary Long MRN: [id number] Date of Birth: [birth year] Referring Provider: Ephriam Jenkins, MD  Encounter Date: 08/25/2016      PT End of Session - 08/25/16 1139    Visit Number 6   Number of Visits 24   Date for PT Re-Evaluation 10/06/16   Authorization Type BCBS   PT Start Time 1102   PT Stop Time 1142   PT Time Calculation (min) 40 min      Past Medical History:  Diagnosis Date  . History of malaria 1999  . Hyperlipidemia     Past Surgical History:  Procedure Laterality Date  . KNEE ARTHROSCOPY Right 2002   ?ACL repair  . KNEE ARTHROSCOPY WITH DRILLING/MICROFRACTURE Right 06/21/2016   Procedure: KNEE ARTHROSCOPY WITH DRILLING/MICROFRACTURE;  Surgeon: Leandrew Koyanagi, MD;  Location: [place];  Service: Orthopedics;  Laterality: Right;  . KNEE ARTHROSCOPY WITH MEDIAL MENISECTOMY Right 06/21/2016   Procedure: RIGHT KNEE ARTHROSCOPY WITH PARTIAL MEDIAL AND LATERAL MENISCECTOMIES, SYNOVECTOMY,  MICROFRACTURE, SUBCHONDROPLASTY;  Surgeon: Leandrew Koyanagi, MD;  Location: [place];  Service: Orthopedics;  Laterality: Right;  . KNEE ARTHROSCOPY WITH SUBCHONDROPLASTY Right 06/21/2016   Procedure: KNEE ARTHROSCOPY WITH SUBCHONDROPLASTY;  Surgeon: Leandrew Koyanagi, MD;  Location: [place];  Service: Orthopedics;  Laterality: Right;    There were no vitals filed for this visit.      Subjective Assessment - 08/25/16 1105    Currently in Pain? Yes   Pain Score 5    Pain Location Knee   Pain Orientation Right   Pain Descriptors / Indicators Aching                         [facility] Adult PT Treatment/Exercise - 08/25/16 0001      Knee/Hip Exercises: Stretches   Gastroc Stretch 1 rep;60 seconds     Knee/Hip Exercises: Aerobic   Stationary Bike 5 minutes L2     Knee/Hip Exercises: Machines for Strengthening   Total Gym Leg Press 35# x 20 bilateral      Knee/Hip Exercises: Seated   Long Arc Quad 10 reps   Long Arc Quad Weight 3 lbs.   Long [employer] Limitations also without weight and ball between knees.    Heel Slides 2 sets   Hamstring Curl 2 sets;10 reps   Hamstring Limitations red band     Knee/Hip Exercises: Supine   Quad Sets 5 reps   Quad Sets Limitations 20 sec holds    Short Arc Quad Sets Right;15 reps;2 sets;10 reps   Short Arc Quad Sets Limitations 4lbs, also no weight hooklying with ball between knees    Bridges Limitations x10   Bridges with Clamshell 10 reps  red band    Straight Leg Raises 1 set;10 reps   Straight Leg Raises Limitations no lag today   Other Supine Knee/Hip Exercises clam with red band                   PT Short Term Goals - 08/15/16 0818      PT SHORT TERM GOAL #2   Title He will report pain decrasded to max 5/10 with weight bearing activity   Baseline 8/10   Time 4   Period Weeks   Status On-going  PT SHORT TERM GOAL #3   Title He will be able to sit with knee flexed for 45-60 min   Time 4   Period Weeks   Status Achieved     PT SHORT TERM GOAL #4   Title He will be able to walk a 1/4 mile with 3-5 max pain   Time 4   Period Weeks   Status Achieved     PT SHORT TERM GOAL #5   Title He will return to all normal home tasks with 4-5 max pain   Baseline up to 8/10   Time 4   Period Weeks   Status On-going     PT SHORT TERM GOAL #6   Title FOTO score decreased to 50 % limited or less   Time 4   Period Weeks   Status Unable to assess           PT Long Term Goals - 07/17/16 0945      PT LONG TERM GOAL #1   Title He will be independent with all HEP issued.    Time 12   Period Weeks   Status New     PT LONG TERM GOAL #2   Title He will improve FOTO score to < 40 % limited   Time 12   Status New     PT LONG TERM GOAL #3   Title  Active ROM will equal Lt    Time 12   Period Weeks   Status New     PT LONG TERM GOAL #4   Title He will walk with 2-3 mac pain all activity    Time 12   Period Weeks   Status New     PT LONG TERM GOAL #5   Title He will RTW full duty   Time 12   Period Weeks   Status New     Additional Long Term Goals   Additional Long Term Goals Yes     PT LONG TERM GOAL #6   Title He will return to all normal home tasks with 2-3 max pain     PT LONG TERM GOAL #7   Title RT leg strength will be 5-/5  to allow return to all normal activity   Time 12   Period Weeks   Status New     PT LONG TERM GOAL #8   Title he will walk stairs step over step one rail safely x 12-16 steps   Time 12   Period Weeks   Status New               Plan - 08/25/16 1141    Clinical Impression Statement No quad lag noted today. Focused strengthening. Should be ready for more closed chain.    PT Next Visit Plan Continue strength, modalities as needed, closed chain? check goals    PT Home Exercise Plan SLR 3 way, QS, ROM knee flexion   Consulted and Agree with Plan of Care Patient      Patient will benefit from skilled therapeutic intervention in order to improve the following deficits and impairments:  Decreased range of motion, Difficulty walking, Pain, Decreased activity tolerance, Decreased strength, Increased edema  Visit Diagnosis: S/P right knee arthroscopy  Acute pain of right knee  Muscle weakness (generalized)  Localized edema  Stiffness of right knee, not elsewhere classified  Difficulty in walking, not elsewhere classified     Problem List Patient Active Problem List   Diagnosis Date Noted  .  Complex tear of medial meniscus of right knee as current injury 06/15/2016  . Unilateral primary osteoarthritis, right knee 06/15/2016  . Closed fracture of right tibial plateau 06/15/2016  . Palpitations 05/10/2016  . Transaminitis 05/10/2016  . Right knee pain 04/20/2016  . Health  maintenance examination 11/27/2013  . Diabetes mellitus type 2, controlled, without complications ([place]) 16/60/6301  . Frequent urination 11/27/2013  . Premature ejaculation 11/27/2013  . Lateral meniscal tear 11/27/2013  . HLD (hyperlipidemia) 06/13/2013  . Fatigue 12/10/2012    Dorene Ar, PTA 08/25/2016, 11:44 AM  [facility] [address] [place], [place], [address] Phone: [phone]   Fax:  [phone]  Name: Gary Long MRN: [id number] Date of Birth: [birth year]

## 2016-09-05 ENCOUNTER — Ambulatory Visit: Payer: BLUE CROSS/BLUE SHIELD | Admitting: Physical Therapy

## 2016-09-05 DIAGNOSIS — Z9889 Other specified postprocedural states: Secondary | ICD-10-CM

## 2016-09-05 DIAGNOSIS — R600 Localized edema: Secondary | ICD-10-CM

## 2016-09-05 DIAGNOSIS — M25561 Pain in right knee: Secondary | ICD-10-CM

## 2016-09-05 DIAGNOSIS — M6281 Muscle weakness (generalized): Secondary | ICD-10-CM

## 2016-09-05 DIAGNOSIS — R262 Difficulty in walking, not elsewhere classified: Secondary | ICD-10-CM

## 2016-09-05 DIAGNOSIS — M25661 Stiffness of right knee, not elsewhere classified: Secondary | ICD-10-CM

## 2016-09-05 NOTE — Therapy (Signed)
[place], [place], [address] Phone: [phone]   Fax:  [phone]  Physical Therapy Treatment  Patient Details  Name: Gary Long MRN: [id number] Date of Birth: 11-27-1970 Referring Provider: Ephriam Jenkins, MD  Encounter Date: 09/05/2016      PT End of Session - 09/05/16 0939    Visit Number 7   Number of Visits 24   Date for PT Re-Evaluation 10/06/16   Authorization Type BCBS   PT Start Time 0930   PT Stop Time 1015   PT Time Calculation (min) 45 min      Past Medical History:  Diagnosis Date  . History of malaria 1999  . Hyperlipidemia     Past Surgical History:  Procedure Laterality Date  . KNEE ARTHROSCOPY Right 2002   ?ACL repair  . KNEE ARTHROSCOPY WITH DRILLING/MICROFRACTURE Right 06/21/2016   Procedure: KNEE ARTHROSCOPY WITH DRILLING/MICROFRACTURE;  Surgeon: Leandrew Koyanagi, MD;  Location: [facility];  Service: Orthopedics;  Laterality: Right;  . KNEE ARTHROSCOPY WITH MEDIAL MENISECTOMY Right 06/21/2016   Procedure: RIGHT KNEE ARTHROSCOPY WITH PARTIAL MEDIAL AND LATERAL MENISCECTOMIES, SYNOVECTOMY,  MICROFRACTURE, SUBCHONDROPLASTY;  Surgeon: Leandrew Koyanagi, MD;  Location: [facility];  Service: Orthopedics;  Laterality: Right;  . KNEE ARTHROSCOPY WITH SUBCHONDROPLASTY Right 06/21/2016   Procedure: KNEE ARTHROSCOPY WITH SUBCHONDROPLASTY;  Surgeon: Leandrew Koyanagi, MD;  Location: [facility];  Service: Orthopedics;  Laterality: Right;    There were no vitals filed for this visit.      Subjective Assessment - 09/05/16 1314    Subjective I should not still have this pain. It has been too long.    Pain Score 5    Pain Location Knee   Pain Orientation Right   Pain Descriptors / Indicators Aching   Aggravating Factors  weightbearing, stairs   Pain Relieving Factors rest                         [facility] Adult PT Treatment/Exercise -  09/05/16 0001      Knee/Hip Exercises: Stretches   Active Hamstring Stretch 2 reps;20 seconds   Active Hamstring Stretch Limitations seated    Gastroc Stretch 2 reps;30 seconds   Gastroc Stretch Limitations runners stretch     Knee/Hip Exercises: Aerobic   Stationary Bike 6 minutes L2     Knee/Hip Exercises: Seated   Hamstring Curl 2 sets;10 reps   Hamstring Limitations green    Sit to Sand 10 reps;without UE support  cues for posture/spine alignment, control      Knee/Hip Exercises: Supine   Short Arc [employer] 15 reps;2 sets   Short Arc Quad Sets Limitations 4lbs   Bridges Limitations x10   Straight Leg Raises 15 reps   Straight Leg Raises Limitations 2#, able to correct quad lag with cues      Knee/Hip Exercises: Sidelying   Hip ABduction Right;2 sets;10 reps                PT Education - 09/05/16 1315    Education provided Yes   Education Details Mcconnel Tape rationale   Person(s) Educated Patient   Methods Explanation   Comprehension Verbalized understanding          PT Short Term Goals - 08/15/16 0818      PT SHORT TERM GOAL #2   Title He will report pain decrasded to max 5/10 with weight bearing activity  Baseline 8/10   Time 4   Period Weeks   Status On-going     PT SHORT TERM GOAL #3   Title He will be able to sit with knee flexed for 45-60 min   Time 4   Period Weeks   Status Achieved     PT SHORT TERM GOAL #4   Title He will be able to walk a 1/4 mile with 3-5 max pain   Time 4   Period Weeks   Status Achieved     PT SHORT TERM GOAL #5   Title He will return to all normal home tasks with 4-5 max pain   Baseline up to 8/10   Time 4   Period Weeks   Status On-going     PT SHORT TERM GOAL #6   Title FOTO score decreased to 50 % limited or less   Time 4   Period Weeks   Status Unable to assess           PT Long Term Goals - 07/17/16 0945      PT LONG TERM GOAL #1   Title He will be independent with all HEP issued.     Time 12   Period Weeks   Status New     PT LONG TERM GOAL #2   Title He will improve FOTO score to < 40 % limited   Time 12   Status New     PT LONG TERM GOAL #3   Title Active ROM will equal Lt    Time 12   Period Weeks   Status New     PT LONG TERM GOAL #4   Title He will walk with 2-3 mac pain all activity    Time 12   Period Weeks   Status New     PT LONG TERM GOAL #5   Title He will RTW full duty   Time 12   Period Weeks   Status New     Additional Long Term Goals   Additional Long Term Goals Yes     PT LONG TERM GOAL #6   Title He will return to all normal home tasks with 2-3 max pain     PT LONG TERM GOAL #7   Title RT leg strength will be 5-/5  to allow return to all normal activity   Time 12   Period Weeks   Status New     PT LONG TERM GOAL #8   Title he will walk stairs step over step one rail safely x 12-16 steps   Time 12   Period Weeks   Status New               Plan - 09/05/16 1316    Clinical Impression Statement Began closed chain today. Pt reports pain with stairs. Trial of Mcconnel tape which decreased pain. Continued strengthening of hip and knee for stability. Good tolerance to closed chain with tape applied. Encouraged increased HEP compliance. He admits to only LAQ at home.    PT Next Visit Plan Continue strength, modalities as needed, add closed chain to HEP ? check goals    PT Home Exercise Plan SLR 3 way, QS, ROM knee flexion   Consulted and Agree with Plan of Care Patient      Patient will benefit from skilled therapeutic intervention in order to improve the following deficits and impairments:  Decreased range of motion, Difficulty walking, Pain, Decreased activity tolerance, Decreased strength, Increased edema  Visit Diagnosis: S/P right knee arthroscopy  Acute pain of right knee  Muscle weakness (generalized)  Localized edema  Stiffness of right knee, not elsewhere classified  Difficulty in walking, not elsewhere  classified     Problem List Patient Active Problem List   Diagnosis Date Noted  . Complex tear of medial meniscus of right knee as current injury 06/15/2016  . Unilateral primary osteoarthritis, right knee 06/15/2016  . Closed fracture of right tibial plateau 06/15/2016  . Palpitations 05/10/2016  . Transaminitis 05/10/2016  . Right knee pain 04/20/2016  . Health maintenance examination 11/27/2013  . Diabetes mellitus type 2, controlled, without complications ([place]) 53/61/4431  . Frequent urination 11/27/2013  . Premature ejaculation 11/27/2013  . Lateral meniscal tear 11/27/2013  . HLD (hyperlipidemia) 06/13/2013  . Fatigue 12/10/2012    Dorene Ar , PTA 09/05/2016, 1:26 PM  [place], [place], [address] Phone: [phone]   Fax:  [phone]  Name: Gary Long MRN: [id number] Date of Birth: 12/04/1970

## 2016-09-13 ENCOUNTER — Ambulatory Visit: Payer: Self-pay | Attending: Orthopaedic Surgery | Admitting: Physical Therapy

## 2016-09-13 ENCOUNTER — Telehealth: Payer: Self-pay | Admitting: Physical Therapy

## 2016-09-13 DIAGNOSIS — M6281 Muscle weakness (generalized): Secondary | ICD-10-CM | POA: Insufficient documentation

## 2016-09-13 DIAGNOSIS — R262 Difficulty in walking, not elsewhere classified: Secondary | ICD-10-CM | POA: Insufficient documentation

## 2016-09-13 DIAGNOSIS — M25661 Stiffness of right knee, not elsewhere classified: Secondary | ICD-10-CM | POA: Insufficient documentation

## 2016-09-13 DIAGNOSIS — Z9889 Other specified postprocedural states: Secondary | ICD-10-CM | POA: Insufficient documentation

## 2016-09-13 DIAGNOSIS — M25561 Pain in right knee: Secondary | ICD-10-CM | POA: Insufficient documentation

## 2016-09-13 DIAGNOSIS — R600 Localized edema: Secondary | ICD-10-CM | POA: Insufficient documentation

## 2016-09-13 NOTE — Telephone Encounter (Signed)
Left message about no show visit scheduled today, 10:15, on voicemail.  Offered an appointment later today.  Reminded him of his next scheduled appointment, date and time.  Asked him to refer to the attendance policy about missed appointments. I asked him to call and cancel appointments if he feels he no longer needs PT.  Liz BeachKaren Fredna Stricker PTA

## 2016-09-15 ENCOUNTER — Ambulatory Visit: Payer: Self-pay | Admitting: Physical Therapy

## 2016-09-15 DIAGNOSIS — R262 Difficulty in walking, not elsewhere classified: Secondary | ICD-10-CM

## 2016-09-15 DIAGNOSIS — M25561 Pain in right knee: Secondary | ICD-10-CM

## 2016-09-15 DIAGNOSIS — M25661 Stiffness of right knee, not elsewhere classified: Secondary | ICD-10-CM

## 2016-09-15 DIAGNOSIS — M6281 Muscle weakness (generalized): Secondary | ICD-10-CM

## 2016-09-15 DIAGNOSIS — R600 Localized edema: Secondary | ICD-10-CM

## 2016-09-15 DIAGNOSIS — Z9889 Other specified postprocedural states: Secondary | ICD-10-CM

## 2016-09-15 NOTE — Therapy (Addendum)
[place], [place], [address] Phone: [phone]   Fax:  [phone]  Physical Therapy Treatment  Patient Details  Name: Gary Long MRN: [id number] Date of Birth: 09/07/1970 Referring Provider: Ephriam Jenkins, MD  Encounter Date: 09/15/2016      PT End of Session - 09/15/16 0933    Visit Number 8   Number of Visits 24   Date for PT Re-Evaluation 10/06/16   Authorization Type BCBS   PT Start Time 0930   PT Stop Time 1761   PT Time Calculation (min) 44 min      Past Medical History:  Diagnosis Date  . History of malaria 1999  . Hyperlipidemia     Past Surgical History:  Procedure Laterality Date  . KNEE ARTHROSCOPY Right 2002   ?ACL repair  . KNEE ARTHROSCOPY WITH DRILLING/MICROFRACTURE Right 06/21/2016   Procedure: KNEE ARTHROSCOPY WITH DRILLING/MICROFRACTURE;  Surgeon: Leandrew Koyanagi, MD;  Location: [facility];  Service: Orthopedics;  Laterality: Right;  . KNEE ARTHROSCOPY WITH MEDIAL MENISECTOMY Right 06/21/2016   Procedure: RIGHT KNEE ARTHROSCOPY WITH PARTIAL MEDIAL AND LATERAL MENISCECTOMIES, SYNOVECTOMY,  MICROFRACTURE, SUBCHONDROPLASTY;  Surgeon: Leandrew Koyanagi, MD;  Location: [facility];  Service: Orthopedics;  Laterality: Right;  . KNEE ARTHROSCOPY WITH SUBCHONDROPLASTY Right 06/21/2016   Procedure: KNEE ARTHROSCOPY WITH SUBCHONDROPLASTY;  Surgeon: Leandrew Koyanagi, MD;  Location: [facility];  Service: Orthopedics;  Laterality: Right;    There were no vitals filed for this visit.      Subjective Assessment - 09/15/16 0932    Subjective Pain is a little less.    Currently in Pain? Yes   Pain Score 4    Pain Location Knee   Pain Orientation Right   Pain Descriptors / Indicators Aching   Aggravating Factors  stairs a little, after work    Pain Relieving Factors rest            [facility] PT Assessment - 09/15/16 0001      ROM / Strength   AROM  / PROM / Strength Strength     AROM   Right Knee Extension -6   Right Knee Flexion 142     Strength   Right Knee Flexion 5/5   Right Knee Extension 3+/5                     [facility] Adult PT Treatment/Exercise - 09/15/16 0001      Knee/Hip Exercises: Stretches   Active Hamstring Stretch 3 reps;30 seconds   Active Hamstring Stretch Limitations 90/90     Knee/Hip Exercises: Aerobic   Stationary Bike 6 minutes L3     Knee/Hip Exercises: Machines for Strengthening   Cybex Knee Extension 5# bilateral, difficult    Cybex Knee Flexion 25# bil and single   Total Gym Leg Press 45# x 20 bilateral , 20# single leg      Knee/Hip Exercises: Standing   Lateral Step Up 15 reps;Hand Hold: 1;Step Height: 6"   Forward Step Up 15 reps;Hand Hold: 1;Step Height: 6"   Forward Step Up Limitations tactile cues    Wall Squat 10 reps   Other Standing Knee Exercises Terminal knee extension green band and ball in wall     Knee/Hip Exercises: Seated   Long Arc Quad 10 reps   Long Arc Quad Limitations green band, poor motor control    Hamstring Curl 2 sets;10 reps   Hamstring Limitations  green      Knee/Hip Exercises: Supine   Short Arc Quad Sets 15 reps;2 sets   Short Arc Quad Sets Limitations 5lbs                 PT Education - 09/15/16 1037    Education provided Yes   Education Details HEP   Person(s) Educated Patient   Methods Explanation;Handout   Comprehension Verbalized understanding          PT Short Term Goals - 09/15/16 0934      PT SHORT TERM GOAL #1   Title He will be independent with initial HEP    Status Achieved     PT SHORT TERM GOAL #2   Title He will report pain decrasded to max 5/10 with weight bearing activity   Baseline average 4-5/10    Time 4   Period Weeks   Status Achieved     PT SHORT TERM GOAL #3   Title He will be able to sit with knee flexed for 45-60 min   Status Achieved     PT SHORT TERM GOAL #4   Title He will be able to  walk a 1/4 mile with 3-5 max pain   Baseline up to 7/10 pain after walking    Time 4   Period Weeks   Status On-going     PT SHORT TERM GOAL #5   Title He will return to all normal home tasks with 4-5 max pain   Baseline up to 7/10 , average 4-5/10   Time 4   Period Weeks   Status On-going     PT SHORT TERM GOAL #6   Title FOTO score decreased to 50 % limited or less   Time 4   Period Weeks   Status Unable to assess           PT Long Term Goals - 07/17/16 0945      PT LONG TERM GOAL #1   Title He will be independent with all HEP issued.    Time 12   Period Weeks   Status New     PT LONG TERM GOAL #2   Title He will improve FOTO score to < 40 % limited   Time 12   Status New     PT LONG TERM GOAL #3   Title Active ROM will equal Lt    Time 12   Period Weeks   Status New     PT LONG TERM GOAL #4   Title He will walk with 2-3 mac pain all activity    Time 12   Period Weeks   Status New     PT LONG TERM GOAL #5   Title He will RTW full duty   Time 12   Period Weeks   Status New     Additional Long Term Goals   Additional Long Term Goals Yes     PT LONG TERM GOAL #6   Title He will return to all normal home tasks with 2-3 max pain     PT LONG TERM GOAL #7   Title RT leg strength will be 5-/5  to allow return to all normal activity   Time 12   Period Weeks   Status New     PT LONG TERM GOAL #8   Title he will walk stairs step over step one rail safely x 12-16 steps   Time 12   Period Weeks   Status New  Plan - 09/15/16 0933    Clinical Impression Statement Pt reports increased  compliance with HEP and decreased pain. He demonstrates improved gait, less antalgic. He reports pain comes mostly after working. He demonstrates decreased motor control with quad exercises. Pt given green band to work on [employer] for [employer]. Also updated HEP with more closed chain exercises. Pain average with walking is decreasing however he can still reach  7/10 after working STG# 2 achieved.    PT Next Visit Plan Continue strength, modalities as needed, add closed chain to HEP ? check goals NEEDS FOTO, needs to schedule more visits    PT Home Exercise Plan SLR 3 way, QS, ROM knee flexion, wall slides, step up forward and lateral, LAQ green band    Consulted and Agree with Plan of Care Patient      Patient will benefit from skilled therapeutic intervention in order to improve the following deficits and impairments:  Decreased range of motion, Difficulty walking, Pain, Decreased activity tolerance, Decreased strength, Increased edema  Visit Diagnosis: S/P right knee arthroscopy  Acute pain of right knee  Muscle weakness (generalized)  Localized edema  Stiffness of right knee, not elsewhere classified  Difficulty in walking, not elsewhere classified     Problem List Patient Active Problem List   Diagnosis Date Noted  . Complex tear of medial meniscus of right knee as current injury 06/15/2016  . Unilateral primary osteoarthritis, right knee 06/15/2016  . Closed fracture of right tibial plateau 06/15/2016  . Palpitations 05/10/2016  . Transaminitis 05/10/2016  . Right knee pain 04/20/2016  . Health maintenance examination 11/27/2013  . Diabetes mellitus type 2, controlled, without complications ([place]) 85/46/2703  . Frequent urination 11/27/2013  . Premature ejaculation 11/27/2013  . Lateral meniscal tear 11/27/2013  . HLD (hyperlipidemia) 06/13/2013  . Fatigue 12/10/2012    Dorene Ar, PTA 09/15/2016, 10:43 AM  [facility] [address] [place], [place], [address] Phone: [phone]   Fax:  [phone]  Name: Gary Long MRN: [id number] Date of Birth: 30-May-1970

## 2016-09-15 NOTE — Patient Instructions (Signed)
Back Wall Slide    With feet __12-16__ inches from wall, lean as much of back against the wall as possible. Gently squat down _6-8__ inches, keeping back against wall. Hold __5__ seconds while counting out loud. Repeat __10__ times. Do _2___ sessions per day.   Step-Up: Lateral   Step up to side with right leg. Bring other foot up onto _6___ inch step. Return to floor position with left leg. Repeat __10__ times per session. Do _2___ sessions per day.   Copyright  VHI. All rights reserved.  Forward   Facing step, place one leg on step, flexed at hip. Step up slowly, bringing hips in line with knee and shoulder. Bring other foot onto step. Reverse process to step back down.  Do __10__ repetitions, _2___ sets.  Knee Extension: Resisted (Sitting)   With band looped around right ankle and under other foot, straighten leg with ankle loop. Keep other leg bent to increase resistance. Repeat ___10_ times per set. Do __2__ sets per session. Do __2__ sessions per day.

## 2016-09-18 ENCOUNTER — Ambulatory Visit: Payer: Self-pay | Admitting: Physical Therapy

## 2016-09-21 ENCOUNTER — Encounter: Payer: Self-pay | Admitting: Physical Therapy

## 2016-09-21 ENCOUNTER — Ambulatory Visit: Payer: Self-pay | Admitting: Physical Therapy

## 2016-09-21 DIAGNOSIS — R262 Difficulty in walking, not elsewhere classified: Secondary | ICD-10-CM

## 2016-09-21 DIAGNOSIS — Z9889 Other specified postprocedural states: Secondary | ICD-10-CM

## 2016-09-21 DIAGNOSIS — M25561 Pain in right knee: Secondary | ICD-10-CM

## 2016-09-21 DIAGNOSIS — M25661 Stiffness of right knee, not elsewhere classified: Secondary | ICD-10-CM

## 2016-09-21 DIAGNOSIS — R600 Localized edema: Secondary | ICD-10-CM

## 2016-09-21 DIAGNOSIS — M6281 Muscle weakness (generalized): Secondary | ICD-10-CM

## 2016-09-21 NOTE — Therapy (Addendum)
[place], [place], [address] Phone: [phone]   Fax:  [phone]  Physical Therapy Treatment/Discharge  Patient Details  Name: Gary Long MRN: [id number] Date of Birth: 1970-01-31 Referring Provider: Ephriam Jenkins, MD  Encounter Date: 09/21/2016      PT End of Session - 09/21/16 1253    Visit Number 9   Number of Visits 24   Date for PT Re-Evaluation 10/06/16   Authorization Type BCBS   PT Start Time 1015   PT Stop Time 1100   PT Time Calculation (min) 45 min   Activity Tolerance Patient tolerated treatment well   Behavior During Therapy [facility] for tasks assessed/performed      Past Medical History:  Diagnosis Date  . History of malaria 1999  . Hyperlipidemia     Past Surgical History:  Procedure Laterality Date  . KNEE ARTHROSCOPY Right 2002   ?ACL repair  . KNEE ARTHROSCOPY WITH DRILLING/MICROFRACTURE Right 06/21/2016   Procedure: KNEE ARTHROSCOPY WITH DRILLING/MICROFRACTURE;  Surgeon: Leandrew Koyanagi, MD;  Location: [facility];  Service: Orthopedics;  Laterality: Right;  . KNEE ARTHROSCOPY WITH MEDIAL MENISECTOMY Right 06/21/2016   Procedure: RIGHT KNEE ARTHROSCOPY WITH PARTIAL MEDIAL AND LATERAL MENISCECTOMIES, SYNOVECTOMY,  MICROFRACTURE, SUBCHONDROPLASTY;  Surgeon: Leandrew Koyanagi, MD;  Location: [facility];  Service: Orthopedics;  Laterality: Right;  . KNEE ARTHROSCOPY WITH SUBCHONDROPLASTY Right 06/21/2016   Procedure: KNEE ARTHROSCOPY WITH SUBCHONDROPLASTY;  Surgeon: Leandrew Koyanagi, MD;  Location: [facility];  Service: Orthopedics;  Laterality: Right;    There were no vitals filed for this visit.      Subjective Assessment - 09/21/16 1241    Subjective Pt arriving to therpay reporting pain is better since beginning therapy. Pt reporting pain after being on his feet all day working.    Pertinent History 2002 scope RT knee.    How long can you  sit comfortably? 30 min    How long can you stand comfortably? 10 mn with decr weight   How long can you walk comfortably? 300 feet   Patient Stated Goals He wants to be able to walk and stand without pain and move knee freely.    Currently in Pain? Yes   Pain Score 4    Pain Location Knee   Pain Orientation Right   Pain Descriptors / Indicators Aching   Pain Type Surgical pain   Pain Onset More than a month ago   Pain Frequency Intermittent   Aggravating Factors  stairs, bending, after working   Pain Relieving Factors rest   Multiple Pain Sites No                         [facility] Adult PT Treatment/Exercise - 09/21/16 0001      Knee/Hip Exercises: Stretches   Gastroc Stretch 2 reps;30 seconds     Knee/Hip Exercises: Aerobic   Stationary Bike 10 minutes L3     Knee/Hip Exercises: Machines for Strengthening   Cybex Knee Extension increased pain today   Cybex Knee Flexion 25# bil and single   Total Gym Leg Press 45# x 20 bilateral , 20# single leg      Knee/Hip Exercises: Standing   Lateral Step Up 15 reps;Hand Hold: 1;Step Height: 6"   Forward Step Up 15 reps;Hand Hold: 1;Step Height: 6"   Forward Step Up Limitations tactile cues    Wall Squat 10 reps  Other Standing Knee Exercises Terminal knee extension green band and ball in wall                PT Education - 09/21/16 1253    Education provided Yes   Education Details reviewed HEP verbally   Person(s) Educated Patient   Methods Explanation   Comprehension Verbalized understanding          PT Short Term Goals - 09/15/16 0934      PT SHORT TERM GOAL #1   Title He will be independent with initial HEP    Status Achieved     PT SHORT TERM GOAL #2   Title He will report pain decrasded to max 5/10 with weight bearing activity   Baseline average 4-5/10    Time 4   Period Weeks   Status Achieved     PT SHORT TERM GOAL #3   Title He will be able to sit with knee flexed for 45-60 min    Status Achieved     PT SHORT TERM GOAL #4   Title He will be able to walk a 1/4 mile with 3-5 max pain   Baseline up to 7/10 pain after walking    Time 4   Period Weeks   Status On-going     PT SHORT TERM GOAL #5   Title He will return to all normal home tasks with 4-5 max pain   Baseline up to 7/10 , average 4-5/10   Time 4   Period Weeks   Status On-going     PT SHORT TERM GOAL #6   Title FOTO score decreased to 50 % limited or less   Time 4   Period Weeks   Status Unable to assess           PT Long Term Goals - 09/21/16 1256      PT LONG TERM GOAL #1   Title He will be independent with all HEP issued.    Time 12   Period Weeks   Status New     PT LONG TERM GOAL #2   Title He will improve FOTO score to < 40 % limited   Time 12   Status New     PT LONG TERM GOAL #3   Title Active ROM will equal Lt    Time 12   Period Weeks   Status New     PT LONG TERM GOAL #4   Title He will walk with 2-3 mac pain all activity    Time 12   Period Weeks   Status New     PT LONG TERM GOAL #5   Title He will RTW full duty   Time 12   Period Weeks   Status New     PT LONG TERM GOAL #6   Title He will return to all normal home tasks with 2-3 max pain     PT LONG TERM GOAL #7   Title RT leg strength will be 5-/5  to allow return to all normal activity   Time 12   Period Weeks   Status New     PT LONG TERM GOAL #8   Title he will walk stairs step over step one rail safely x 12-16 steps   Time 12   Period Weeks   Status New               Plan - 09/21/16 1253    Clinical Impression Statement Patient arriving to therapy  reporting 4/10 pain in his right knee. Pt reporting pain is worse in the morning and after working/standing long periods. Pt still with weakness noted in L knee. Pt performed LE strengthening and reviewed his HEP Continue with skilled PT as pt progresses toward his LTG's.    Rehab Potential Good   PT Frequency 2x / week   PT Duration 12  weeks   PT Treatment/Interventions Cryotherapy;Electrical Stimulation;Functional mobility training;Stair training;Gait training;Passive range of motion;Patient/family education;Manual techniques;Taping;Therapeutic exercise;Therapeutic activities   PT Next Visit Plan Continue strength, modalities as needed, add closed chain to HEP ? check goals NEEDS FOTO, needs to schedule more visits    PT Home Exercise Plan SLR 3 way, QS, ROM knee flexion, wall slides, step up forward and lateral, LAQ green band    Consulted and Agree with Plan of Care Patient      Patient will benefit from skilled therapeutic intervention in order to improve the following deficits and impairments:  Decreased range of motion, Difficulty walking, Pain, Decreased activity tolerance, Decreased strength, Increased edema  Visit Diagnosis: S/P right knee arthroscopy  Acute pain of right knee  Muscle weakness (generalized)  Localized edema  Stiffness of right knee, not elsewhere classified  Difficulty in walking, not elsewhere classified     Problem List Patient Active Problem List   Diagnosis Date Noted  . Complex tear of medial meniscus of right knee as current injury 06/15/2016  . Unilateral primary osteoarthritis, right knee 06/15/2016  . Closed fracture of right tibial plateau 06/15/2016  . Palpitations 05/10/2016  . Transaminitis 05/10/2016  . Right knee pain 04/20/2016  . Health maintenance examination 11/27/2013  . Diabetes mellitus type 2, controlled, without complications ([place]) 77/93/9030  . Frequent urination 11/27/2013  . Premature ejaculation 11/27/2013  . Lateral meniscal tear 11/27/2013  . HLD (hyperlipidemia) 06/13/2013  . Fatigue 12/10/2012    Oretha Caprice, MPT 09/21/2016, 1:01 PM  [facility] [address] [place], [place], [address] Phone: [phone]   Fax:  [phone]  Name: Gary Long MRN: [id number] Date of Birth:  [birth year]  PHYSICAL THERAPY DISCHARGE SUMMARY  Visits from Start of Care: 9  Current functional level related to goals / functional outcomes: See above. He did not return after this visit   Remaining deficits: See above   Education / Equipment: HEP Plan:                                                    Patient goals were partially met. Patient is being discharged due to not returning since the last visit.  ?????    Noralee Stain PT  11/16/16    9:18 AM

## 2016-10-11 ENCOUNTER — Telehealth: Payer: Self-pay | Admitting: Physical Therapy

## 2016-10-11 NOTE — Telephone Encounter (Signed)
Left Message for patient to call back at main number. Need to determine if patient wishes to continue or DC as he has not been seen in 3+ weeks.   Percell Boston, PT, DPT 10/11/16 2:38 PM Phone: [phone] Fax: [phone]

## 2016-10-23 ENCOUNTER — Telehealth: Payer: Self-pay | Admitting: Family Medicine

## 2016-10-23 NOTE — Telephone Encounter (Signed)
Pt dropped off disability parking placard form and exemption to perform cpr form   Pt has appointment 10/16  Forms in rx tower up front for dr g

## 2016-10-23 NOTE — Telephone Encounter (Signed)
Placed form in Dr. G's box.  

## 2016-10-24 ENCOUNTER — Ambulatory Visit (INDEPENDENT_AMBULATORY_CARE_PROVIDER_SITE_OTHER): Payer: Self-pay | Admitting: Family Medicine

## 2016-10-24 ENCOUNTER — Encounter: Payer: Self-pay | Admitting: Family Medicine

## 2016-10-24 VITALS — BP 116/70 | HR 69 | Temp 98.1°F | Wt 186.0 lb

## 2016-10-24 DIAGNOSIS — S83281D Other tear of lateral meniscus, current injury, right knee, subsequent encounter: Secondary | ICD-10-CM

## 2016-10-24 DIAGNOSIS — Z23 Encounter for immunization: Secondary | ICD-10-CM

## 2016-10-24 DIAGNOSIS — K219 Gastro-esophageal reflux disease without esophagitis: Secondary | ICD-10-CM

## 2016-10-24 DIAGNOSIS — E119 Type 2 diabetes mellitus without complications: Secondary | ICD-10-CM

## 2016-10-24 LAB — HEMOGLOBIN A1C: HEMOGLOBIN A1C: 6.8 % — AB (ref 4.6–6.5)

## 2016-10-24 NOTE — Assessment & Plan Note (Signed)
S/p surgery, appreciate ortho care. Suggested f/u with Dr Roda Shutters per prior recommendation. 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

## 2016-10-24 NOTE — Telephone Encounter (Signed)
Temporary handicap placard and temporary exemption for work CPR training Will review at [facility] today.

## 2016-10-24 NOTE — Progress Notes (Signed)
BP 116/70 (BP Location: Left Arm, Patient Position: Sitting, Cuff Size: Normal)   Pulse 69   Temp 98.1 F (36.7 C) (Oral)   Wt 186 lb (84.4 kg)   SpO2 97%   BMI 27.47 kg/m    CC: fill out work excuse forms after recent knee surgery, DM f/u Subjective:    Patient ID: Gary Long, male    DOB: 05-13-1970, 46 y.o.   MRN: [id number]  HPI: Gary Long is a 46 y.o. male presenting on 10/24/2016 for Knee surgery follow-up (Had stop PT) and Elevated A1c (Says A1c was elevated on last labs. Wants rechecked today)   Ongoing knee pain. S/p R knee arthroscopy with medial meniscectomy and subchondroplasty 06/2016 Roda Shutters). Stopped PT - didn't feel he was progressing as desired.   Vasc US 08/2016 showed R baker's cyst measuring 4.5cm  Recent dx DM - working on diet control. Requests rpt A1c. He doesn't have glucose meter. Avoiding carbs and sugars in diet. Denies paresthesias. No hypoglycemic sxs. No recent eye exam. Pneumovax 2014.  Diabetic Foot Exam - Simple   Simple Foot Form Diabetic Foot exam was performed with the following findings:  Yes 10/24/2016 11:15 AM  Visual Inspection No deformities, no ulcerations, no other skin breakdown bilaterally:  Yes Sensation Testing Intact to touch and monofilament testing bilaterally:  Yes Pulse Check Posterior Tibialis and Dorsalis pulse intact bilaterally:  Yes Comments      Relevant past medical, surgical, family and social history reviewed and updated as indicated. Interim medical history since our last visit reviewed. Allergies and medications reviewed and updated. Outpatient Medications Prior to Visit  Medication Sig Dispense Refill  . acetaminophen (TYLENOL) 500 MG tablet Take 500 mg by mouth every 6 (six) hours as needed.    Marland Kitchen HYDROmorphone (DILAUDID) 2 MG tablet Take 1 tablet (2 mg total) by mouth every 4 (four) hours as needed for severe pain. 30 tablet 0  . HYDROmorphone (DILAUDID) 2 MG tablet Take 1 tablet po BID prn  pain 20 tablet 0  . IBUPROFEN PO Take 600 mg by mouth as needed.     . meloxicam (MOBIC) 15 MG tablet TAKE 1 TABLET BY MOUTH EVERY DAY 30 tablet 0  . ondansetron (ZOFRAN) 4 MG tablet Take 1-2 tablets (4-8 mg total) by mouth every 8 (eight) hours as needed for nausea or vomiting. 40 tablet 0  . promethazine (PHENERGAN) 25 MG tablet Take 1 tablet (25 mg total) by mouth every 6 (six) hours as needed for nausea. 30 tablet 1  . senna-docusate (SENOKOT S) 8.6-50 MG tablet Take 1 tablet by mouth at bedtime as needed. 30 tablet 1   No facility-administered medications prior to visit.      Per HPI unless specifically indicated in ROS section below Review of Systems     Objective:    BP 116/70 (BP Location: Left Arm, Patient Position: Sitting, Cuff Size: Normal)   Pulse 69   Temp 98.1 F (36.7 C) (Oral)   Wt 186 lb (84.4 kg)   SpO2 97%   BMI 27.47 kg/m   Wt Readings from Last 3 Encounters:  10/24/16 186 lb (84.4 kg)  06/21/16 184 lb (83.5 kg)  05/23/16 183 lb 8 oz (83.2 kg)    Physical Exam  Constitutional: He appears well-developed and well-nourished. No distress.  HENT:  Head: Normocephalic and atraumatic.  Right Ear: External ear normal.  Left Ear: External ear normal.  Nose: Nose normal.  Mouth/Throat: Oropharynx is clear and moist.  No oropharyngeal exudate.  Eyes: Pupils are equal, round, and reactive to light. Conjunctivae and EOM are normal. No scleral icterus.  Neck: Normal range of motion. Neck supple.  Cardiovascular: Normal rate, regular rhythm, normal heart sounds and intact distal pulses.   No murmur heard. Pulmonary/Chest: Effort normal and breath sounds normal. No respiratory distress. He has no wheezes. He has no rales.  Musculoskeletal: He exhibits no edema.  See HPI for foot exam if done  Lymphadenopathy:    He has no cervical adenopathy.  Skin: Skin is warm and dry. No rash noted.  Psychiatric: He has a normal mood and affect.  Nursing note and vitals  reviewed.  Results for orders placed or performed in visit on 05/10/16  Hepatic function panel  Result Value Ref Range   Total Bilirubin 0.4 0.2 - 1.2 mg/dL   Bilirubin, Direct 0.1 0.0 - 0.3 mg/dL   Alkaline Phosphatase 70 39 - 117 U/L   AST 23 0 - 37 U/L   ALT 27 0 - 53 U/L   Total Protein 7.6 6.0 - 8.3 g/dL   Albumin 4.2 3.5 - 5.2 g/dL  Hepatitis panel, acute  Result Value Ref Range   Hepatitis B Surface Ag NEGATIVE NEGATIVE   HCV Ab NEGATIVE NEGATIVE   Hep B C IgM NON REACTIVE NON REACTIVE   Hep A IgM NON REACTIVE NON REACTIVE  Gamma GT  Result Value Ref Range   GGT 57 (H) 7 - 51 U/L  TSH  Result Value Ref Range   TSH 2.12 0.35 - 4.50 uIU/mL   Lab Results  Component Value Date   HGBA1C 7.0 (H) 04/20/2016       Assessment & Plan:   Problem List Items Addressed This Visit    Diabetes mellitus type 2, controlled, without complications (HCC) - Primary    Chronic, relatively new diagnosis this year. Not on medication. Reviewed healthy diet and lifestyle changes to help keep good glycemic control. Update A1c today. He does have fmhx DM. If remaining diabetic, recommended schedule eye exam.       Relevant Orders   Hemoglobin A1c   GERD (gastroesophageal reflux disease)    Endorses intermittent sxs worse with chewing mint gum.  GERD precautions handout provided today.  Discussed PRN zantac/pepcid use.       Lateral meniscal tear    S/p surgery, appreciate ortho care. Suggested f/u with Dr Roda Shutters per prior recommendation. 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  Eustaquio Boyden, MD

## 2016-10-24 NOTE — Assessment & Plan Note (Signed)
Chronic, relatively new diagnosis this year. Not on medication. Reviewed healthy diet and lifestyle changes to help keep good glycemic control. Update A1c today. He does have fmhx DM. If remaining diabetic, recommended schedule eye exam.

## 2016-10-24 NOTE — Patient Instructions (Addendum)
Flu shot today.  Recheck A1c today.  Forms filled out today.  Schedule follow up with Dr Roda Shutters Try zantac or pepcid for reflux. Head of bed elevated. Avoid of citrus, fatty foods, chocolate, peppermint, and excessive alcohol, along with sodas, orange juice (acidic drinks) At least a few hours between dinner and bed, minimize naps after eating. No smoking.

## 2016-10-24 NOTE — Assessment & Plan Note (Signed)
Endorses intermittent sxs worse with chewing mint gum.  GERD precautions handout provided today.  Discussed PRN zantac/pepcid use.

## 2016-10-31 ENCOUNTER — Ambulatory Visit (INDEPENDENT_AMBULATORY_CARE_PROVIDER_SITE_OTHER): Payer: Self-pay | Admitting: Orthopaedic Surgery

## 2016-10-31 DIAGNOSIS — M25561 Pain in right knee: Secondary | ICD-10-CM

## 2016-10-31 MED ORDER — BUPIVACAINE HCL 0.5 % IJ SOLN
2.0000 mL | INTRAMUSCULAR | Status: AC | PRN
  Administered 2016-10-31: 2 mL via INTRA_ARTICULAR

## 2016-10-31 MED ORDER — LIDOCAINE HCL 1 % IJ SOLN
2.0000 mL | INTRAMUSCULAR | Status: AC | PRN
  Administered 2016-10-31: 2 mL

## 2016-10-31 MED ORDER — METHYLPREDNISOLONE ACETATE 40 MG/ML IJ SUSP
40.0000 mg | INTRAMUSCULAR | Status: AC | PRN
  Administered 2016-10-31: 40 mg via INTRA_ARTICULAR

## 2016-10-31 NOTE — Progress Notes (Signed)
Office Visit Note   Patient: Gary Long           Date of Birth: March 15, 1970           MRN: [id number] Visit Date: 10/31/2016              Requested by: Eustaquio Boyden, MD [address] [place], [place] [address] PCP: Eustaquio Boyden, MD   Assessment & Plan: Visit Diagnoses:  1. Acute pain of right knee     Plan: Overall impression is right knee IT band syndrome.  Cortisone injection was performed today.  I did also recommend physical therapy but he states that physical therapy has made it worse in the past.  He should follow-up if the injection fails to give him any relief.  Follow-Up Instructions: Return if symptoms worsen or fail to improve.   Orders:  No orders of the defined types were placed in this encounter.  No orders of the defined types were placed in this encounter.     Procedures: Large Joint Inj Date/Time: 10/31/2016 2:38 PM Performed by: Tarry Kos Authorized by: Tarry Kos   Consent Given by:  Patient Timeout: prior to procedure the correct patient, procedure, and site was verified   Indications:  Pain Location:  Knee Site:  R knee Prep: patient was prepped and draped in usual sterile fashion   Needle Size:  22 G Approach:  Lateral Ultrasound Guidance: No   Fluoroscopic Guidance: No   Arthrogram: No   Medications:  2 mL bupivacaine 0.5 %; 40 mg methylPREDNISolone acetate 40 MG/ML; 2 mL lidocaine 1 % Patient tolerance:  Patient tolerated the procedure well with no immediate complications     Clinical Data: No additional findings.   Subjective: Chief Complaint  Patient presents with  . Right Knee - Pain    Patient is a 46 year old gentleman who is 4 months status post right knee arthroscopy with mainly lateral compartment chondromalacia who comes in with lateral sided knee pain.  He endorses snapping and pain with flexion and extension of the knee.  Denies any numbness and tingling.  He states that the surgery has  helped significantly and this pain is different.    Review of Systems  Constitutional: Negative.   All other systems reviewed and are negative.    Objective: Vital Signs: There were no vitals taken for this visit.  Physical Exam  Constitutional: He is oriented to person, place, and time. He appears well-developed and well-nourished.  Pulmonary/Chest: Effort normal.  Abdominal: Soft.  Neurological: He is alert and oriented to person, place, and time.  Skin: Skin is warm.  Psychiatric: He has a normal mood and affect. His behavior is normal. Judgment and thought content normal.  Nursing note and vitals reviewed.   Ortho Exam Right knee exam shows no joint effusion.  Fully healed surgical scars.  Excellent range of motion.  He has pain over the IT band and Gertie's tubercle.  I did not feel any obvious evidence of snapping. collaterals and cruciates are grossly intact. Specialty Comments:  No specialty comments available.  Imaging: No results found.   PMFS History: Patient Active Problem List   Diagnosis Date Noted  . GERD (gastroesophageal reflux disease) 10/24/2016  . Complex tear of medial meniscus of right knee as current injury 06/15/2016  . Unilateral primary osteoarthritis, right knee 06/15/2016  . Closed fracture of right tibial plateau 06/15/2016  . Palpitations 05/10/2016  . Transaminitis 05/10/2016  . Health maintenance examination  11/27/2013  . Diabetes mellitus type 2, controlled, without complications (HCC) 11/27/2013  . Frequent urination 11/27/2013  . Premature ejaculation 11/27/2013  . Lateral meniscal tear 11/27/2013  . HLD (hyperlipidemia) 06/13/2013  . Fatigue 12/10/2012   Past Medical History:  Diagnosis Date  . History of malaria 1999  . Hyperlipidemia     Family History  Problem Relation Age of Onset  . Stroke Mother   . Diabetes Mother   . Hypertension Sister   . Cancer Neg Hx   . CAD Neg Hx     Past Surgical History:  Procedure  Laterality Date  . KNEE ARTHROSCOPY Right 2002   ?ACL repair  . KNEE ARTHROSCOPY WITH DRILLING/MICROFRACTURE Right 06/21/2016   Procedure: KNEE ARTHROSCOPY WITH DRILLING/MICROFRACTURE;  Surgeon: Tarry KosXu, Dalyah Pla M, MD;  Location: [facility];  Service: Orthopedics;  Laterality: Right;  . KNEE ARTHROSCOPY WITH MEDIAL MENISECTOMY Right 06/21/2016   Procedure: RIGHT KNEE ARTHROSCOPY WITH PARTIAL MEDIAL AND LATERAL MENISCECTOMIES, SYNOVECTOMY,  MICROFRACTURE, SUBCHONDROPLASTY;  Surgeon: Tarry KosXu, Amos Micheals M, MD;  Location: [facility];  Service: Orthopedics;  Laterality: Right;  . KNEE ARTHROSCOPY WITH SUBCHONDROPLASTY Right 06/21/2016   Procedure: KNEE ARTHROSCOPY WITH SUBCHONDROPLASTY;  Surgeon: Tarry KosXu, Omari Koslosky M, MD;  Location: [facility];  Service: Orthopedics;  Laterality: Right;   Social History   Occupational History  . Not on file.   Social History Main Topics  . Smoking status: Never Smoker  . Smokeless tobacco: Never Used  . Alcohol use No  . Drug use: No  . Sexual activity: Not on file

## 2017-01-07 ENCOUNTER — Emergency Department
Admission: EM | Admit: 2017-01-07 | Discharge: 2017-01-07 | Disposition: A | Discharge status: Home / Self Care | Payer: Self-pay | Attending: Student in an Organized Health Care Education/Training Program | Admitting: Student in an Organized Health Care Education/Training Program

## 2017-01-07 ENCOUNTER — Encounter: Payer: Self-pay | Admitting: Emergency Medicine

## 2017-01-07 ENCOUNTER — Other Ambulatory Visit: Payer: Self-pay

## 2017-01-07 DIAGNOSIS — B349 Viral infection, unspecified: Secondary | ICD-10-CM | POA: Insufficient documentation

## 2017-01-07 DIAGNOSIS — J069 Acute upper respiratory infection, unspecified: Secondary | ICD-10-CM | POA: Insufficient documentation

## 2017-01-07 DIAGNOSIS — E119 Type 2 diabetes mellitus without complications: Secondary | ICD-10-CM | POA: Insufficient documentation

## 2017-01-07 DIAGNOSIS — B9789 Other viral agents as the cause of diseases classified elsewhere: Secondary | ICD-10-CM

## 2017-01-07 LAB — INFLUENZA PANEL BY PCR (TYPE A & B)
Influenza A By PCR: NEGATIVE
Influenza B By PCR: NEGATIVE

## 2017-01-07 MED ORDER — BENZONATATE 100 MG PO CAPS: 200.0000 mg | ORAL_CAPSULE | Freq: Three times a day (TID) | ORAL | 0 refills | Status: DC | PRN

## 2017-01-07 NOTE — ED Triage Notes (Signed)
Pt reports cough since Friday and running nose. Taking theraflu and mucinex. Pt also c/o head congestion.

## 2017-01-07 NOTE — Discharge Instructions (Signed)
Follow-up with your primary care provider if any continued problems or worsening of her symptoms. Increase fluids. Tylenol or ibuprofen as needed for fever, body aches, throat pain. Tessalon Perles 2 every 8 hours as needed for cough. You may continue taking Mucinex with this medication.

## 2017-01-07 NOTE — ED Notes (Signed)
Reports started feeling ill late Friday, general malaise, body aches, headache, tired and weak.

## 2017-01-07 NOTE — ED Provider Notes (Signed)
[facility] Emergency Department Provider Note  ___________________________________________   First MD Initiated Contact with Patient 01/07/17 1253     (approximate)  I have reviewed the triage vital signs and the nursing notes.   HISTORY  Chief Complaint Cough   HPI Gary Long is a 46 y.o. male complains of sudden onset of cough and rhinorrhea for approximately 2 days. Patient has not actually taken his temperature but has felt feverish. He also has had congestion and feels bad "all over". Patient has been taking TheraFlu and Mucinex without any relief. He states he did get the flu immunization.he rates his pain as 7 out of 10.   Past Medical History:  Diagnosis Date  . History of malaria 1999  . Hyperlipidemia     Patient Active Problem List   Diagnosis Date Noted  . GERD (gastroesophageal reflux disease) 10/24/2016  . Complex tear of medial meniscus of right knee as current injury 06/15/2016  . Unilateral primary osteoarthritis, right knee 06/15/2016  . Closed fracture of right tibial plateau 06/15/2016  . Palpitations 05/10/2016  . Transaminitis 05/10/2016  . Health maintenance examination 11/27/2013  . Diabetes mellitus type 2, controlled, without complications (HCC) 11/27/2013  . Frequent urination 11/27/2013  . Premature ejaculation 11/27/2013  . Lateral meniscal tear 11/27/2013  . HLD (hyperlipidemia) 06/13/2013  . Fatigue 12/10/2012    Past Surgical History:  Procedure Laterality Date  . KNEE ARTHROSCOPY Right 2002   ?ACL repair  . KNEE ARTHROSCOPY WITH DRILLING/MICROFRACTURE Right 06/21/2016   Procedure: KNEE ARTHROSCOPY WITH DRILLING/MICROFRACTURE;  Surgeon: Tarry KosXu, Naiping M, MD;  Location: [facility];  Service: Orthopedics;  Laterality: Right;  . KNEE ARTHROSCOPY WITH MEDIAL MENISECTOMY Right 06/21/2016   Procedure: RIGHT KNEE ARTHROSCOPY WITH PARTIAL MEDIAL AND LATERAL MENISCECTOMIES, SYNOVECTOMY,   MICROFRACTURE, SUBCHONDROPLASTY;  Surgeon: Tarry KosXu, Naiping M, MD;  Location: [facility];  Service: Orthopedics;  Laterality: Right;  . KNEE ARTHROSCOPY WITH SUBCHONDROPLASTY Right 06/21/2016   Procedure: KNEE ARTHROSCOPY WITH SUBCHONDROPLASTY;  Surgeon: Tarry KosXu, Naiping M, MD;  Location:  [facility];  Service: Orthopedics;  Laterality: Right;    Prior to Admission medications   Medication Sig Start Date End Date Taking? Authorizing Provider  acetaminophen (TYLENOL) 500 MG tablet Take 500 mg by mouth every 6 (six) hours as needed.    [provider]  benzonatate (TESSALON PERLES) 100 MG capsule Take 2 capsules (200 mg total) by mouth 3 (three) times daily as needed. 01/07/17 01/07/18  Tommi RumpsSummers, Jazma Pickel L, PA-C    Allergies Chloroquine; Hydrocodone; and Percocet [oxycodone-acetaminophen]  Family History  Problem Relation Age of Onset  . Stroke Mother   . Diabetes Mother   . Hypertension Sister   . Cancer Neg Hx   . CAD Neg Hx     Social History Social History   Tobacco Use  . Smoking status: Never Smoker  . Smokeless tobacco: Never Used  Substance Use Topics  . Alcohol use: No  . Drug use: No    Review of Systems Constitutional: subjective fever/chills ENT: No sore throat. Cardiovascular: Denies chest pain. Respiratory: Denies shortness of breath. Positive nonproductive cough. Gastrointestinal: No abdominal pain.  No nausea, no vomiting.  No diarrhea. Genitourinary: Negative for dysuria. Musculoskeletal: Negative for back pain. Positive for muscle aches. Skin: Negative for rash. Neurological: Negative for headaches, focal weakness or numbness. ____________________________________________   PHYSICAL EXAM:  VITAL SIGNS: ED Triage Vitals  Enc Vitals Group     BP 01/07/17 1224 121/78  Pulse Rate 01/07/17 1224 87     Resp 01/07/17 1224 18     Temp 01/07/17 1224 98.4 F (36.9 C)     Temp Source 01/07/17 1224 Oral     SpO2 01/07/17 1224 98  %     Weight 01/07/17 1225 185 lb (83.9 kg)     Height 01/07/17 1225 5\' 9"  (1.753 m)     Head Circumference --      Peak Flow --      Pain Score 01/07/17 1224 7     Pain Loc --      Pain Edu? --      Excl. in GC? --    Constitutional: Alert and oriented. Well appearing and in no acute distress. Eyes: Conjunctivae are normal.  Head: Atraumatic. Nose: mild congestion/rhinnorhea.  EACs are clear, TMs are dull bilaterally without erythema or injection. Mouth/Throat: Mucous membranes are moist.  Oropharynx non-erythematous. Neck: No stridor.   Hematological/Lymphatic/Immunilogical: No cervical lymphadenopathy. Cardiovascular: Normal rate, regular rhythm. Grossly normal heart sounds.  Good peripheral circulation. Respiratory: Normal respiratory effort.  No retractions. Lungs CTAB. Gastrointestinal: Soft and nontender. No distention.  No CVA tenderness. Musculoskeletal: moves upper and lower extremities without any difficulty. Normal gait was noted. Neurologic:  Normal speech and language. No gross focal neurologic deficits are appreciated. Skin:  Skin is warm, dry and intact.  Psychiatric: Mood and affect are normal. Speech and behavior are normal.  ____________________________________________   LABS (all labs ordered are listed, but only abnormal results are displayed)  Labs Reviewed  INFLUENZA PANEL BY PCR (TYPE A & B)    ____________________________________________   PROCEDURES  Procedure(s) performed: None  Procedures  Critical Care performed: No  ____________________________________________   INITIAL IMPRESSION / ASSESSMENT AND PLAN / ED COURSE Patient is aware that his flu test was negative. He states that the over-the-counter medication does not help with his cough. He was given a prescription for Tessalon Perles to every 8 hours as needed for cough. Patient is allergic to both hydrocodone and oxycodone.  Is encouraged to increase fluids. He was given a note to  remain out of work. ____________________________________________   FINAL CLINICAL IMPRESSION(S) / ED DIAGNOSES  Final diagnoses:  Viral URI with cough     ED Discharge Orders        Ordered    benzonatate (TESSALON PERLES) 100 MG capsule  3 times daily PRN     01/07/17 1501       Note:  This document was prepared using Dragon voice recognition software and may include unintentional dictation errors.    Tommi RumpsSummers, Keinan Brouillet L, PA-C 01/07/17 1518    Willy Eddyobinson, Patrick, MD 01/07/17 [phone]

## 2017-01-23 ENCOUNTER — Encounter (INDEPENDENT_AMBULATORY_CARE_PROVIDER_SITE_OTHER): Payer: Self-pay | Admitting: Orthopaedic Surgery

## 2017-01-23 ENCOUNTER — Ambulatory Visit (INDEPENDENT_AMBULATORY_CARE_PROVIDER_SITE_OTHER): Payer: PRIVATE HEALTH INSURANCE | Admitting: Orthopaedic Surgery

## 2017-01-23 DIAGNOSIS — M1711 Unilateral primary osteoarthritis, right knee: Secondary | ICD-10-CM

## 2017-01-23 NOTE — Progress Notes (Signed)
Office Visit Note   Patient: Gary Long           Date of Birth: Jan 26, 1970           MRN: [id number] Visit Date: 01/23/2017              Requested by: Eustaquio BoydenGutierrez, Javier, MD [address] [place], [place] [id number] PCP: Eustaquio BoydenGutierrez, Javier, MD   Assessment & Plan: Visit Diagnoses:  1. Unilateral primary osteoarthritis, right knee     Plan: Impression is end-stage lateral compartment degenerative joint disease that has failed arthroscopic treatment and microfracture surgery.  We briefly discussed partial knee replacement the patient is currently well compensated from this and is not interested in undergoing a partial knee replacement.  I did discuss Monovisc injection with the patient which he is interested in.  Verlon AuWe're going back for the injection is approved by his insurance.  Follow-Up Instructions: Return if symptoms worsen or fail to improve.   Orders:  No orders of the defined types were placed in this encounter.  No orders of the defined types were placed in this encounter.     Procedures: No procedures performed   Clinical Data: No additional findings.   Subjective: Chief Complaint  Patient presents with  . Right Knee - Follow-up    Patient is following up for his right knee pain.  He is 7 months status post right knee arthroscopy with microfracture sub-chondroplasty.  He continues to have locking pain on the lateral aspect of the knee.  He denies any swelling.    Review of Systems   Objective: Vital Signs: There were no vitals taken for this visit.  Physical Exam  Ortho Exam Right knee exam shows fully healed surgical scar.  He has no joint effusion.  Lateral joint line tenderness. Specialty Comments:  No specialty comments available.  Imaging: No results found.   PMFS History: Patient Active Problem List   Diagnosis Date Noted  . GERD (gastroesophageal reflux disease) 10/24/2016  . Complex tear of medial meniscus of right knee  as current injury 06/15/2016  . Unilateral primary osteoarthritis, right knee 06/15/2016  . Closed fracture of right tibial plateau 06/15/2016  . Palpitations 05/10/2016  . Transaminitis 05/10/2016  . Health maintenance examination 11/27/2013  . Diabetes mellitus type 2, controlled, without complications (HCC) 11/27/2013  . Frequent urination 11/27/2013  . Premature ejaculation 11/27/2013  . Lateral meniscal tear 11/27/2013  . HLD (hyperlipidemia) 06/13/2013  . Fatigue 12/10/2012   Past Medical History:  Diagnosis Date  . History of malaria 1999  . Hyperlipidemia     Family History  Problem Relation Age of Onset  . Stroke Mother   . Diabetes Mother   . Hypertension Sister   . Cancer Neg Hx   . CAD Neg Hx     Past Surgical History:  Procedure Laterality Date  . KNEE ARTHROSCOPY Right 2002   ?ACL repair  . KNEE ARTHROSCOPY WITH DRILLING/MICROFRACTURE Right 06/21/2016   Procedure: KNEE ARTHROSCOPY WITH DRILLING/MICROFRACTURE;  Surgeon: Tarry KosXu, Fabion Gatson M, MD;  Location: [facility];  Service: Orthopedics;  Laterality: Right;  . KNEE ARTHROSCOPY WITH MEDIAL MENISECTOMY Right 06/21/2016   Procedure: RIGHT KNEE ARTHROSCOPY WITH PARTIAL MEDIAL AND LATERAL MENISCECTOMIES, SYNOVECTOMY,  MICROFRACTURE, SUBCHONDROPLASTY;  Surgeon: Tarry KosXu, Suvi Archuletta M, MD;  Location: [facility];  Service: Orthopedics;  Laterality: Right;  . KNEE ARTHROSCOPY WITH SUBCHONDROPLASTY Right 06/21/2016   Procedure: KNEE ARTHROSCOPY WITH SUBCHONDROPLASTY;  Surgeon: Tarry KosXu, Ohm Dentler M, MD;  Location:  [facility];  Service: Orthopedics;  Laterality: Right;   Social History   Occupational History  . Not on file  Tobacco Use  . Smoking status: Never Smoker  . Smokeless tobacco: Never Used  Substance and Sexual Activity  . Alcohol use: No  . Drug use: No  . Sexual activity: Not on file

## 2017-02-07 ENCOUNTER — Encounter: Payer: Self-pay | Admitting: Family Medicine

## 2017-02-09 ENCOUNTER — Encounter: Payer: Self-pay | Admitting: Family Medicine

## 2017-02-09 ENCOUNTER — Ambulatory Visit (INDEPENDENT_AMBULATORY_CARE_PROVIDER_SITE_OTHER): Payer: PRIVATE HEALTH INSURANCE | Admitting: Family Medicine

## 2017-02-09 VITALS — BP 134/82 | HR 74 | Temp 98.4°F | Ht 69.0 in | Wt 190.2 lb

## 2017-02-09 DIAGNOSIS — E119 Type 2 diabetes mellitus without complications: Secondary | ICD-10-CM

## 2017-02-09 DIAGNOSIS — E785 Hyperlipidemia, unspecified: Secondary | ICD-10-CM | POA: Diagnosis not present

## 2017-02-09 DIAGNOSIS — Z0000 Encounter for general adult medical examination without abnormal findings: Secondary | ICD-10-CM | POA: Diagnosis not present

## 2017-02-09 DIAGNOSIS — M1711 Unilateral primary osteoarthritis, right knee: Secondary | ICD-10-CM

## 2017-02-09 LAB — COMPREHENSIVE METABOLIC PANEL
ALBUMIN: 4.3 g/dL (ref 3.5–5.2)
ALK PHOS: 80 U/L (ref 39–117)
ALT: 57 U/L — ABNORMAL HIGH (ref 0–53)
AST: 29 U/L (ref 0–37)
BILIRUBIN TOTAL: 0.4 mg/dL (ref 0.2–1.2)
BUN: 12 mg/dL (ref 6–23)
CALCIUM: 9.6 mg/dL (ref 8.4–10.5)
CO2: 32 mEq/L (ref 19–32)
CREATININE: 1.08 mg/dL (ref 0.40–1.50)
Chloride: 99 mEq/L (ref 96–112)
GFR: 94.44 mL/min (ref >60.00)
Glucose, Bld: 147 mg/dL — ABNORMAL HIGH (ref 70–99)
Potassium: 3.7 mEq/L (ref 3.5–5.1)
Sodium: 137 mEq/L (ref 135–145)
TOTAL PROTEIN: 7.8 g/dL (ref 6.0–8.3)

## 2017-02-09 LAB — LIPID PANEL
CHOLESTEROL: 253 mg/dL — AB (ref 0–200)
HDL: 60.3 mg/dL (ref >39.00)
LDL Cholesterol: 175 mg/dL — ABNORMAL HIGH (ref 0–99)
NonHDL: 192.58
TRIGLYCERIDES: 90 mg/dL (ref 0.0–149.0)
Total CHOL/HDL Ratio: 4
VLDL: 18 mg/dL (ref 0.0–40.0)

## 2017-02-09 LAB — MICROALBUMIN / CREATININE URINE RATIO
Creatinine,U: 79.9 mg/dL
Microalb Creat Ratio: 0.9 mg/g (ref 0.0–30.0)
Microalb, Ur: <0.7 mg/dL (ref 0.0–1.9)

## 2017-02-09 LAB — HEMOGLOBIN A1C: HEMOGLOBIN A1C: 7.8 % — AB (ref 4.6–6.5)

## 2017-02-09 MED ORDER — DICLOFENAC SODIUM 1 % TD GEL: 1.0000 "application " | Freq: Three times a day (TID) | TRANSDERMAL | 1 refills | Status: DC

## 2017-02-09 NOTE — Assessment & Plan Note (Signed)
Chronic. Update A1c and microalb. RTC 6 mo DM f/u visit.

## 2017-02-09 NOTE — Assessment & Plan Note (Signed)
Preventative protocols reviewed and updated unless pt declined. Discussed healthy diet and lifestyle.  

## 2017-02-09 NOTE — Progress Notes (Signed)
BP 134/82 (BP Location: Left Arm, Patient Position: Sitting, Cuff Size: Normal)   Pulse 74   Temp 98.4 F (36.9 C) (Oral)   Ht 5\' 9"  (1.753 m)   Wt 190 lb 4 oz (86.3 kg)   SpO2 96%   BMI 28.10 kg/m    CC: CPE Subjective:    Patient ID: Gary Long, male    DOB: 1970-11-16, 47 y.o.   MRN: [id number]  HPI: Gary Glancerincewill C Coull is a 47 y.o. male presenting on 02/09/2017 for Annual Exam   Checks sugars intermittently - 90-100.  Ongoing R knee pain sees Dr Roda ShuttersXu ortho. Doesn't want to proceed with surgery. Asks for topical NSAID course.  Preventative: No fmhx prostate or colon cancer.  Flu shot yearly Tetanus - 03/2012 Pneumovax 03/2012 Seat belt use discussed Sunscreen use discussed. No changing moles on skin. 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 Interim medical history since our last visit reviewed. Allergies and medications reviewed and updated. Outpatient Medications Prior to Visit  Medication Sig Dispense Refill  . acetaminophen (TYLENOL) 500 MG tablet Take 500 mg by mouth every 6 (six) hours as needed.    . benzonatate (TESSALON PERLES) 100 MG capsule Take 2 capsules (200 mg total) by mouth 3 (three) times daily as needed. 30 capsule 0   No facility-administered medications prior to visit.      Per HPI unless specifically indicated in ROS section below Review of Systems  Constitutional: Positive for fever. Negative for activity change, appetite change, chills, fatigue and unexpected weight change.  HENT: Negative for hearing loss.   Eyes: Negative for visual disturbance.  Respiratory: Positive for cough (recent URI). Negative for chest tightness, shortness of breath and wheezing.     Cardiovascular: Negative for chest pain, palpitations and leg swelling.  Gastrointestinal: Negative for abdominal distention, abdominal pain, blood in stool, constipation, diarrhea, nausea and vomiting.  Genitourinary: Negative for difficulty urinating and hematuria.  Musculoskeletal: Negative for arthralgias, myalgias and neck pain.  Skin: Negative for rash.  Neurological: Negative for dizziness, seizures, syncope and headaches.  Hematological: Negative for adenopathy. Does not bruise/bleed easily.  Psychiatric/Behavioral: Negative for dysphoric mood. The patient is not nervous/anxious.        Objective:    BP 134/82 (BP Location: Left Arm, Patient Position: Sitting, Cuff Size: Normal)   Pulse 74   Temp 98.4 F (36.9 C) (Oral)   Ht 5\' 9"  (1.753 m)   Wt 190 lb 4 oz (86.3 kg)   SpO2 96%   BMI 28.10 kg/m   Wt Readings from Last 3 Encounters:  02/09/17 190 lb 4 oz (86.3 kg)  01/07/17 185 lb (83.9 kg)  10/24/16 186 lb (84.4 kg)    Physical Exam  Constitutional: He is oriented to person, place, and time. He appears well-developed and well-nourished. No distress.  HENT:  Head: Normocephalic and atraumatic.  Right Ear: Hearing, tympanic membrane, external ear and ear canal normal.  Left Ear: Hearing, tympanic membrane, external ear and ear canal normal.  Nose: Nose normal.  Mouth/Throat: Uvula is midline, oropharynx is clear and moist and mucous membranes are normal. No oropharyngeal exudate, posterior oropharyngeal edema or posterior oropharyngeal erythema.  Eyes: Conjunctivae and EOM are normal. Pupils are equal, round, and reactive to  light. No scleral icterus.  Neck: Normal range of motion. Neck supple. No thyromegaly present.  Cardiovascular: Normal rate, regular rhythm, normal heart sounds and intact distal pulses.  No murmur heard. Pulses:      Radial pulses are 2+ on the right side, and 2+ on the left side.  Pulmonary/Chest: Effort normal and breath sounds normal. No  respiratory distress. He has no wheezes. He has no rales.  Abdominal: Soft. Bowel sounds are normal. He exhibits no distension and no mass. There is no tenderness. There is no rebound and no guarding.  Musculoskeletal: Normal range of motion. He exhibits no edema.  Lymphadenopathy:    He has no cervical adenopathy.  Neurological: He is alert and oriented to person, place, and time.  CN grossly intact, station and gait intact  Skin: Skin is warm and dry. No rash noted.  Psychiatric: He has a normal mood and affect. His behavior is normal. Judgment and thought content normal.  Nursing note and vitals reviewed.  Results for orders placed or performed during the hospital encounter of 01/07/17  Influenza panel by PCR (type A & B)  Result Value Ref Range   Influenza A By PCR NEGATIVE NEGATIVE   Influenza B By PCR NEGATIVE NEGATIVE      Assessment & Plan:   Problem List Items Addressed This Visit    Diabetes mellitus type 2, controlled, without complications (HCC)    Chronic. Update A1c and microalb. RTC 6 mo DM f/u visit.       Relevant Orders   Comprehensive metabolic panel   Hemoglobin A1c   Microalbumin / creatinine urine ratio   Health maintenance examination - Primary    Preventative protocols reviewed and updated unless pt declined. Discussed healthy diet and lifestyle.       HLD (hyperlipidemia)    Update FLP off medications.  The 10-year ASCVD risk score Denman George DC Montez Hageman., et al., 2013) is: 8.9%   Values used to calculate the score:     Age: 49 years     Sex: Male     Is Non-Hispanic African American: Yes     Diabetic: Yes     Tobacco smoker: No     Systolic Blood Pressure: 134 mmHg     Is BP treated: No     HDL Cholesterol: 59.2 mg/dL     Total Cholesterol: 246 mg/dL       Relevant Orders   Lipid panel   Comprehensive metabolic panel   Unilateral primary osteoarthritis, right knee    Rx voltaren gel. Suggested glucosamine.          Follow up plan: Return in  about 6 months (around 08/09/2017) for follow up visit.  Eustaquio Boyden, MD

## 2017-02-09 NOTE — Patient Instructions (Addendum)
Look into glucosamine for joint health. Labs today Try voltaren gel for right knee.  Health Maintenance, Male A healthy lifestyle and preventive care is important for your health and wellness. Ask your health care provider about what schedule of regular examinations is right for you. What should I know about weight and diet? Eat a Healthy Diet  Eat plenty of vegetables, fruits, whole grains, low-fat dairy products, and lean protein.  Do not eat a lot of foods high in solid fats, added sugars, or salt.  Maintain a Healthy Weight Regular exercise can help you achieve or maintain a healthy weight. You should:  Do at least 150 minutes of exercise each week. The exercise should increase your heart rate and make you sweat (moderate-intensity exercise).  Do strength-training exercises at least twice a week.  Watch Your Levels of Cholesterol and Blood Lipids  Have your blood tested for lipids and cholesterol every 5 years starting at 47 years of age. If you are at high risk for heart disease, you should start having your blood tested when you are 47 years old. You may need to have your cholesterol levels checked more often if: ? Your lipid or cholesterol levels are high. ? You are older than 47 years of age. ? You are at high risk for heart disease.  What should I know about cancer screening? Many types of cancers can be detected early and may often be prevented. Lung Cancer  You should be screened every year for lung cancer if: ? You are a current smoker who has smoked for at least 30 years. ? You are a former smoker who has quit within the past 15 years.  Talk to your health care provider about your screening options, when you should start screening, and how often you should be screened.  Colorectal Cancer  Routine colorectal cancer screening usually begins at 47 years of age and should be repeated every 5-10 years until you are 47 years old. You may need to be screened more often if  early forms of precancerous polyps or small growths are found. Your health care provider may recommend screening at an earlier age if you have risk factors for colon cancer.  Your health care provider may recommend using home test kits to check for hidden blood in the stool.  A small camera at the end of a tube can be used to examine your colon (sigmoidoscopy or colonoscopy). This checks for the earliest forms of colorectal cancer.  Prostate and Testicular Cancer  Depending on your age and overall health, your health care provider may do certain tests to screen for prostate and testicular cancer.  Talk to your health care provider about any symptoms or concerns you have about testicular or prostate cancer.  Skin Cancer  Check your skin from head to toe regularly.  Tell your health care provider about any new moles or changes in moles, especially if: ? There is a change in a mole's size, shape, or color. ? You have a mole that is larger than a pencil eraser.  Always use sunscreen. Apply sunscreen liberally and repeat throughout the day.  Protect yourself by wearing long sleeves, pants, a wide-brimmed hat, and sunglasses when outside.  What should I know about heart disease, diabetes, and high blood pressure?  If you are 55-27 years of age, have your blood pressure checked every 3-5 years. If you are 4 years of age or older, have your blood pressure checked every year. You should have your  blood pressure measured twice-once when you are at a hospital or clinic, and once when you are not at a hospital or clinic. Record the average of the two measurements. To check your blood pressure when you are not at a hospital or clinic, you can use: ? An automated blood pressure machine at a pharmacy. ? A home blood pressure monitor.  Talk to your health care provider about your target blood pressure.  If you are between 4045-144 years old, ask your health care provider if you should take aspirin to  prevent heart disease.  Have regular diabetes screenings by checking your fasting blood sugar level. ? If you are at a normal weight and have a low risk for diabetes, have this test once every three years after the age of 345. ? If you are overweight and have a high risk for diabetes, consider being tested at a younger age or more often.  A one-time screening for abdominal aortic aneurysm (AAA) by ultrasound is recommended for men aged 65-75 years who are current or former smokers. What should I know about preventing infection? Hepatitis B If you have a higher risk for hepatitis B, you should be screened for this virus. Talk with your health care provider to find out if you are at risk for hepatitis B infection. Hepatitis C Blood testing is recommended for:  Everyone born from [id number] through 1965.  Anyone with known risk factors for hepatitis C.  Sexually Transmitted Diseases (STDs)  You should be screened each year for STDs including gonorrhea and chlamydia if: ? You are sexually active and are younger than 47 years of age. ? You are older than 47 years of age and your health care provider tells you that you are at risk for this type of infection. ? Your sexual activity has changed since you were last screened and you are at an increased risk for chlamydia or gonorrhea. Ask your health care provider if you are at risk.  Talk with your health care provider about whether you are at high risk of being infected with HIV. Your health care provider may recommend a prescription medicine to help prevent HIV infection.  What else can I do?  Schedule regular health, dental, and eye exams.  Stay current with your vaccines (immunizations).  Do not use any tobacco products, such as cigarettes, chewing tobacco, and e-cigarettes. If you need help quitting, ask your health care provider.  Limit alcohol intake to no more than 2 drinks per day. One drink equals 12 ounces of beer, 5 ounces of wine, or  1 ounces of hard liquor.  Do not use street drugs.  Do not share needles.  Ask your health care provider for help if you need support or information about quitting drugs.  Tell your health care provider if you often feel depressed.  Tell your health care provider if you have ever been abused or do not feel safe at home. This information is not intended to replace advice given to you by your health care provider. Make sure you discuss any questions you have with your health care provider. Document Released: 06/24/2007 Document Revised: 08/25/2015 Document Reviewed: 09/29/2014 Elsevier Interactive Patient Education  [employer]2018 Elsevier Inc.

## 2017-02-09 NOTE — Assessment & Plan Note (Addendum)
Update FLP off medications.  The 10-year ASCVD risk score Denman George(Goff DC Montez HagemanJr., et al., 2013) is: 8.9%   Values used to calculate the score:     Age: [age] years     Sex: Male     Is Non-Hispanic African American: Yes     Diabetic: Yes     Tobacco smoker: No     Systolic Blood Pressure: 134 mmHg     Is BP treated: No     HDL Cholesterol: 59.2 mg/dL     Total Cholesterol: 246 mg/dL

## 2017-02-09 NOTE — Assessment & Plan Note (Signed)
Rx voltaren gel. Suggested glucosamine.

## 2017-02-10 ENCOUNTER — Other Ambulatory Visit: Payer: Self-pay | Admitting: Family Medicine

## 2017-02-10 MED ORDER — METFORMIN HCL 500 MG PO TABS: 500.0000 mg | ORAL_TABLET | Freq: Every day | ORAL | 3 refills | Status: DC

## 2017-02-13 ENCOUNTER — Telehealth (INDEPENDENT_AMBULATORY_CARE_PROVIDER_SITE_OTHER): Payer: Self-pay | Admitting: Orthopaedic Surgery

## 2017-02-13 NOTE — Telephone Encounter (Signed)
Inj was denied. Called patient to advise. LMOM

## 2017-02-13 NOTE — Telephone Encounter (Signed)
Patient called asked if he can get a call back concerning whether or not the insurance company approved for him to have an injection in his right knee. The number to contact patient is [phone]

## 2017-04-18 ENCOUNTER — Telehealth: Payer: Self-pay | Admitting: Family Medicine

## 2017-04-18 DIAGNOSIS — E119 Type 2 diabetes mellitus without complications: Secondary | ICD-10-CM

## 2017-04-18 NOTE — Telephone Encounter (Signed)
Pt came in requesting a signature for application for disability parking pass. Placed in Rx tower. Pt awould like to have his A1C checked and has scheduled an appt with the lab for tomorrow. If appropriate please put order in.

## 2017-04-18 NOTE — Telephone Encounter (Addendum)
Left message on vm per dpr notifying pt his Disability Parking Placard form is ready to pick up.  Also, relayed Dr. Timoteo ExposeG's message about the A1c.  [Placed form at front office.]

## 2017-04-18 NOTE — Telephone Encounter (Signed)
Placed form in Dr. G's box.  

## 2017-04-18 NOTE — Telephone Encounter (Signed)
Filled and in Lisa's box. Pt states criteria are inability to walk 23600ft without stopping to rest, needing ambulatory assistive device, and limited ability to ambulate due to orthopedic condition.  POC A1c ordered - however plz notify: last A1c was 2/1 so not 3 months and insurance may not cover. If he's willing to pay out of pocket, come in for A1c otherwise wait until after 05/09/2017.

## 2017-04-19 ENCOUNTER — Other Ambulatory Visit: Payer: PRIVATE HEALTH INSURANCE

## 2017-05-11 ENCOUNTER — Emergency Department
Admission: EM | Admit: 2017-05-11 | Discharge: 2017-05-11 | Disposition: A | Discharge status: Home / Self Care | Payer: No Typology Code available for payment source | Attending: Emergency Medicine | Admitting: Emergency Medicine

## 2017-05-11 ENCOUNTER — Encounter: Payer: Self-pay | Admitting: Emergency Medicine

## 2017-05-11 ENCOUNTER — Other Ambulatory Visit: Payer: Self-pay

## 2017-05-11 DIAGNOSIS — Z79899 Other long term (current) drug therapy: Secondary | ICD-10-CM | POA: Insufficient documentation

## 2017-05-11 DIAGNOSIS — J0100 Acute maxillary sinusitis, unspecified: Secondary | ICD-10-CM | POA: Insufficient documentation

## 2017-05-11 DIAGNOSIS — E119 Type 2 diabetes mellitus without complications: Secondary | ICD-10-CM | POA: Diagnosis not present

## 2017-05-11 DIAGNOSIS — R519 Headache, unspecified: Secondary | ICD-10-CM

## 2017-05-11 DIAGNOSIS — R51 Headache: Secondary | ICD-10-CM | POA: Diagnosis not present

## 2017-05-11 DIAGNOSIS — R0981 Nasal congestion: Secondary | ICD-10-CM | POA: Diagnosis present

## 2017-05-11 MED ORDER — FLUTICASONE PROPIONATE 50 MCG/ACT NA SUSP: 1.0000 | Freq: Every day | NASAL | 0 refills | Status: DC

## 2017-05-11 MED ORDER — AMOXICILLIN 875 MG PO TABS: 875.0000 mg | ORAL_TABLET | Freq: Two times a day (BID) | ORAL | 0 refills | Status: DC

## 2017-05-11 MED ORDER — PREDNISONE 10 MG (21) PO TBPK: ORAL_TABLET | ORAL | 0 refills | Status: DC

## 2017-05-11 MED ORDER — IBUPROFEN 800 MG PO TABS
800.0000 mg | ORAL_TABLET | Freq: Once | ORAL | Status: AC
  Administered 2017-05-11: 800 mg via ORAL
  Filled 2017-05-11: qty 1

## 2017-05-11 NOTE — ED Provider Notes (Signed)
[facility] Emergency Department Provider Note  ____________________________________________   First MD Initiated Contact with Patient 05/11/17 [phone]     (approximate)  I have reviewed the triage vital signs and the nursing notes.   HISTORY  Chief Complaint Cough and Nasal Congestion    HPI Gary Long is a 47 y.o. male presents emergency department complaining of sinus pain and congestion.  States he has had to take Tylenol and ibuprofen for the headache.  He states that the pressure on the right side of his head is worse.  He denies any fever or chills.  He denies any vomiting or diarrhea.  He denies any chest pain or shortness of breath.  He states he does have a dry cough  Past Medical History:  Diagnosis Date  . History of malaria 1999  . Hyperlipidemia     Patient Active Problem List   Diagnosis Date Noted  . GERD (gastroesophageal reflux disease) 10/24/2016  . Complex tear of medial meniscus of right knee as current injury 06/15/2016  . Unilateral primary osteoarthritis, right knee 06/15/2016  . Closed fracture of right tibial plateau 06/15/2016  . Palpitations 05/10/2016  . Transaminitis 05/10/2016  . Health maintenance examination 11/27/2013  . Diabetes mellitus type 2, controlled, without complications (HCC) 11/27/2013  . Frequent urination 11/27/2013  . Premature ejaculation 11/27/2013  . Lateral meniscal tear 11/27/2013  . HLD (hyperlipidemia) 06/13/2013  . Fatigue 12/10/2012    Past Surgical History:  Procedure Laterality Date  . KNEE ARTHROSCOPY Right 2002   ?ACL repair  . KNEE ARTHROSCOPY WITH DRILLING/MICROFRACTURE Right 06/21/2016   Procedure: KNEE ARTHROSCOPY WITH DRILLING/MICROFRACTURE;  Surgeon: Tarry Kos, MD;  Location: [facility];  Service: Orthopedics;  Laterality: Right;  . KNEE ARTHROSCOPY WITH MEDIAL MENISECTOMY Right 06/21/2016   Procedure: RIGHT KNEE ARTHROSCOPY WITH PARTIAL MEDIAL AND  LATERAL MENISCECTOMIES, SYNOVECTOMY,  MICROFRACTURE, SUBCHONDROPLASTY;  Surgeon: Tarry Kos, MD;  Location: [facility];  Service: Orthopedics;  Laterality: Right;  . KNEE ARTHROSCOPY WITH SUBCHONDROPLASTY Right 06/21/2016   Procedure: KNEE ARTHROSCOPY WITH SUBCHONDROPLASTY;  Surgeon: Tarry Kos, MD;  Location: [facility];  Service: Orthopedics;  Laterality: Right;    Prior to Admission medications   Medication Sig Start Date End Date Taking? Authorizing Provider  acetaminophen (TYLENOL) 500 MG tablet Take 500 mg by mouth every 6 (six) hours as needed.    [provider]  amoxicillin (AMOXIL) 875 MG tablet Take 1 tablet (875 mg total) by mouth 2 (two) times daily. 05/11/17   Fisher, Roselyn Bering, PA-C  diclofenac sodium (VOLTAREN) 1 % GEL Apply 1 application topically 3 (three) times daily. 02/09/17   Eustaquio Boyden, MD  fluticasone [facility]) 50 MCG/ACT nasal spray Place 1 spray into both nostrils daily. 05/11/17 05/11/18  Sherrie Mustache Roselyn Bering, PA-C  metFORMIN (GLUCOPHAGE) 500 MG tablet Take 1 tablet (500 mg total) by mouth daily with breakfast. 02/10/17   Eustaquio Boyden, MD  predniSONE (STERAPRED UNI-PAK 21 TAB) 10 MG (21) TBPK tablet Take 6 pills on day one then decrease by 1 pill each day 05/11/17   Faythe Ghee, PA-C    Allergies Chloroquine; Hydrocodone; and Percocet [oxycodone-acetaminophen]  Family History  Problem Relation Age of Onset  . Stroke Mother   . Diabetes Mother   . Hypertension Sister   . Cancer Neg Hx   . CAD Neg Hx     Social History Social History   Tobacco Use  . Smoking status: Never  Smoker  . Smokeless tobacco: Never Used  Substance Use Topics  . Alcohol use: No  . Drug use: No    Review of Systems  Constitutional: No fever/chills, positive for headache Eyes: No visual changes. ENT: No sore throat.  Positive for sinus congestion and pain Respiratory: Positive cough Genitourinary: Negative for dysuria. Musculoskeletal:  Negative for back pain. Skin: Negative for rash.    ____________________________________________   PHYSICAL EXAM:  VITAL SIGNS: ED Triage Vitals  Enc Vitals Group     BP 05/11/17 0628 126/64     Pulse Rate 05/11/17 0628 81     Resp 05/11/17 0628 18     Temp 05/11/17 0628 98.1 F (36.7 C)     Temp Source 05/11/17 0628 Oral     SpO2 05/11/17 0628 98 %     Weight 05/11/17 0628 185 lb (83.9 kg)     Height 05/11/17 0628  (1.753 m)     Head Circumference --      Peak Flow --      Pain Score 05/11/17 0627 10     Pain Loc --      Pain Edu? --      Excl. in GC? --     Constitutional: Alert and oriented. Well appearing and in no acute distress. Eyes: Conjunctivae are normal.  Head: Atraumatic.  The right frontal sinuses tender to palpation Nose: No congestion/rhinnorhea.  Nasal mucosa is red and grossly swollen Mouth/Throat: Mucous membranes are moist.  Throat is normal Supple, no lymphadenopathy is noted Cardiovascular: Normal rate, regular rhythm.  Heart sounds are normal Respiratory: Normal respiratory effort.  No retractions, lungs are clear to auscultation GU: deferred Musculoskeletal: FROM all extremities, warm and well perfused Neurologic:  Normal speech and language.  Skin:  Skin is warm, dry and intact. No rash noted. Psychiatric: Mood and affect are normal. Speech and behavior are normal.  ____________________________________________   LABS (all labs ordered are listed, but only abnormal results are displayed)  Labs Reviewed - No data to display ____________________________________________   ____________________________________________  RADIOLOGY    ____________________________________________   PROCEDURES  Procedure(s) performed: No  Procedures    ____________________________________________   INITIAL IMPRESSION / ASSESSMENT AND PLAN / ED COURSE  Pertinent labs & imaging results that were available during my care of the patient were  reviewed by me and considered in my medical decision making (see chart for details).  Patient is a 47 year old male presents emergency department complaining of sinus headache and sinus congestion with dry cough.  He denies any fever or chills.  On physical exam the patient appears well.  He is holding an ice pack to the right side of his head.  The nasal mucosa is grossly red and swollen.  Throat is normal.  Lungs are clear to auscultation  Plan findings to the patient.  He was diagnosed with acute sinusitis with sinus headache.  He was given a prescription for amoxicillin, Sterapred Dosepak, Flonase.  He does take over-the-counter Tylenol as needed for headache.  He was given ibuprofen 800 mg p.o. prior to discharge.  He is to follow-up with his regular doctor if not better in 3 to 5 days.  He is to return to the emergency department if worsening.  He states he understands and was discharged in stable condition.     As part of my medical decision making, I reviewed the following data within the electronic MEDICAL RECORD NUMBER Nursing notes reviewed and incorporated, Old chart reviewed, Notes from  prior ED visits and [place] Controlled Substance Database  ____________________________________________   FINAL CLINICAL IMPRESSION(S) / ED DIAGNOSES  Final diagnoses:  Acute maxillary sinusitis, recurrence not specified  Sinus headache      NEW MEDICATIONS STARTED DURING THIS VISIT:  New Prescriptions   AMOXICILLIN (AMOXIL) 875 MG TABLET    Take 1 tablet (875 mg total) by mouth 2 (two) times daily.   FLUTICASONE (FLONASE) 50 MCG/ACT NASAL SPRAY    Place 1 spray into both nostrils daily.   PREDNISONE (STERAPRED UNI-PAK 21 TAB) 10 MG (21) TBPK TABLET    Take 6 pills on day one then decrease by 1 pill each day     Note:  This document was prepared using Dragon voice recognition software and may include unintentional dictation errors.    Faythe Ghee, PA-C 05/11/17 2440    Nita Sickle, MD 05/14/17 2016

## 2017-05-11 NOTE — ED Triage Notes (Signed)
Patient ambulatory to triage with steady gait, without difficulty or distress noted; pt reports nonprod cough, sinus congestion 7 pressure x 2 days

## 2017-05-11 NOTE — ED Notes (Signed)
See triage note presents with possible infection..states he has been having a lot of sinus pressure and pain to right side of head  Occasional cough and runny nose  Afebrile on arrival

## 2017-05-11 NOTE — Discharge Instructions (Addendum)
Follow-up with your regular doctor if not better in 3 days.  Use medication as prescribed.  You may also take over-the-counter Tylenol for pain as needed.  Return to the emergency department if your worsening

## 2017-07-02 LAB — HM DIABETES EYE EXAM

## 2017-07-04 ENCOUNTER — Encounter: Payer: Self-pay | Admitting: Family Medicine

## 2017-07-04 ENCOUNTER — Ambulatory Visit (INDEPENDENT_AMBULATORY_CARE_PROVIDER_SITE_OTHER): Payer: BLUE CROSS/BLUE SHIELD | Admitting: Family Medicine

## 2017-07-04 VITALS — BP 118/82 | HR 63 | Temp 98.2°F | Ht 69.0 in | Wt 188.0 lb

## 2017-07-04 DIAGNOSIS — E785 Hyperlipidemia, unspecified: Secondary | ICD-10-CM | POA: Diagnosis not present

## 2017-07-04 DIAGNOSIS — E1169 Type 2 diabetes mellitus with other specified complication: Secondary | ICD-10-CM

## 2017-07-04 LAB — LIPID PANEL
Cholesterol: 243 mg/dL — ABNORMAL HIGH (ref 0–200)
HDL: 55.6 mg/dL (ref >39.00)
LDL Cholesterol: 165 mg/dL — ABNORMAL HIGH (ref 0–99)
NonHDL: 187.41
Total CHOL/HDL Ratio: 4
Triglycerides: 113 mg/dL (ref 0.0–149.0)
VLDL: 22.6 mg/dL (ref 0.0–40.0)

## 2017-07-04 LAB — POCT GLYCOSYLATED HEMOGLOBIN (HGB A1C): HEMOGLOBIN A1C: 7.3 % — AB (ref 4.0–5.6)

## 2017-07-04 MED ORDER — METFORMIN HCL 500 MG PO TABS: 500.0000 mg | ORAL_TABLET | Freq: Two times a day (BID) | ORAL | 3 refills | Status: DC

## 2017-07-04 NOTE — Assessment & Plan Note (Addendum)
Update FLP. Discussed possibly starting statin if LDL >100. Reviewed side effects to monitor for.

## 2017-07-04 NOTE — Assessment & Plan Note (Addendum)
Chronic, update A1c = 7.3% - will increase metformin to 500mg  bid. Will refer to diabetes education. RTC 3-4 mo DM f/u visit. Check insulin level today - eval for insulin sensitivity.

## 2017-07-04 NOTE — Progress Notes (Signed)
BP 118/82 (BP Location: Left Arm, Patient Position: Sitting, Cuff Size: Normal)   Pulse 63   Temp 98.2 F (36.8 C) (Oral)   Ht 5\' 9"  (1.753 m)   Wt 188 lb (85.3 kg)   SpO2 96%   BMI 27.76 kg/m    CC: DM check Subjective:    Patient ID: Gary Long, male    DOB: 03-28-1970, 47 y.o.   MRN: [id number]  HPI: Gary Long is a 47 y.o. male presenting on 07/04/2017 for Diabetes (Check A1c. States he has different insurance.)   DM - does not regularly check sugars - last week random 160, fasting 120s. Compliant with antihyperglycemic regimen which includes: metformin 500mg  daily. Denies low sugars or hypoglycemic symptoms. Denies paresthesias. Last diabetic eye exam 2 days ago - normal. Pneumovax: 2014. Prevnar: not due. Glucometer brand: checks with wife's meter. DSME: interested - will refer. Lab Results  Component Value Date   HGBA1C 7.3 (A) 07/04/2017   Diabetic Foot Exam - Simple   Simple Foot Form Diabetic Foot exam was performed with the following findings:  Yes 07/04/2017 10:21 AM  Visual Inspection No deformities, no ulcerations, no other skin breakdown bilaterally:  Yes Sensation Testing Intact to touch and monofilament testing bilaterally:  Yes Pulse Check Posterior Tibialis and Dorsalis pulse intact bilaterally:  Yes Comments    Lab Results  Component Value Date   MICROALBUR <0.7 02/09/2017    HLD - attributes to familial elevated cholesterol levels.   Ongoing knee pain R>L limits activity - IT band locks.   Relevant past medical, surgical, family and social history reviewed and updated as indicated. Interim medical history since our last visit reviewed. Allergies and medications reviewed and updated. Outpatient Medications Prior to Visit  Medication Sig Dispense Refill  . metFORMIN (GLUCOPHAGE) 500 MG tablet Take 1 tablet (500 mg total) by mouth daily with breakfast. 90 tablet 3  . acetaminophen (TYLENOL) 500 MG tablet Take 500 mg by mouth  every 6 (six) hours as needed.    Marland Kitchen amoxicillin (AMOXIL) 875 MG tablet Take 1 tablet (875 mg total) by mouth 2 (two) times daily. 20 tablet 0  . diclofenac sodium (VOLTAREN) 1 % GEL Apply 1 application topically 3 (three) times daily. 1 Tube 1  . fluticasone (FLONASE) 50 MCG/ACT nasal spray Place 1 spray into both nostrils daily. 16 g 0  . predniSONE (STERAPRED UNI-PAK 21 TAB) 10 MG (21) TBPK tablet Take 6 pills on day one then decrease by 1 pill each day 21 tablet 0   No facility-administered medications prior to visit.      Per HPI unless specifically indicated in ROS section below Review of Systems     Objective:    BP 118/82 (BP Location: Left Arm, Patient Position: Sitting, Cuff Size: Normal)   Pulse 63   Temp 98.2 F (36.8 C) (Oral)   Ht 5\' 9"  (1.753 m)   Wt 188 lb (85.3 kg)   SpO2 96%   BMI 27.76 kg/m   Wt Readings from Last 3 Encounters:  07/04/17 188 lb (85.3 kg)  05/11/17 185 lb (83.9 kg)  02/09/17 190 lb 4 oz (86.3 kg)    Physical Exam  Constitutional: He appears well-developed and well-nourished. No distress.  HENT:  Head: Normocephalic and atraumatic.  Right Ear: External ear normal.  Left Ear: External ear normal.  Nose: Nose normal.  Mouth/Throat: Oropharynx is clear and moist. No oropharyngeal exudate.  Eyes: Pupils are equal, round, and reactive to light.  Conjunctivae and EOM are normal. No scleral icterus.  Neck: Normal range of motion. Neck supple.  Cardiovascular: Normal rate, regular rhythm, normal heart sounds and intact distal pulses.  No murmur heard. Pulmonary/Chest: Effort normal and breath sounds normal. No respiratory distress. He has no wheezes. He has no rales.  Musculoskeletal: He exhibits no edema.  See HPI for foot exam if done  Lymphadenopathy:    He has no cervical adenopathy.  Skin: Skin is warm and dry. No rash noted.  Psychiatric: He has a normal mood and affect.  Nursing note and vitals reviewed.  Results for orders placed or  performed in visit on 07/04/17  POCT glycosylated hemoglobin (Hb A1C)  Result Value Ref Range   Hemoglobin A1C 7.3 (A) 4.0 - 5.6 %   HbA1c POC (<> result, manual entry)  4.0 - 5.6 %   HbA1c, POC (prediabetic range)  5.7 - 6.4 %   HbA1c, POC (controlled diabetic range)  0.0 - 7.0 %  HM DIABETES EYE EXAM  Result Value Ref Range   HM Diabetic Eye Exam No Retinopathy No Retinopathy      Assessment & Plan:   Problem List Items Addressed This Visit    Type 2 diabetes mellitus with other specified complication (HCC) - Primary    Chronic, update A1c = 7.3% - will increase metformin to 500mg  bid. Will refer to diabetes education. RTC 3-4 mo DM f/u visit. Check insulin level today - eval for insulin sensitivity.       Relevant Medications   metFORMIN (GLUCOPHAGE) 500 MG tablet   Other Relevant Orders   Lipid panel   Insulin, random   Ambulatory referral to diabetic education   HLD (hyperlipidemia)    Update FLP. Discussed possibly starting statin if LDL >100. Reviewed side effects to monitor for.           Meds ordered this encounter  Medications  . metFORMIN (GLUCOPHAGE) 500 MG tablet    Sig: Take 1 tablet (500 mg total) by mouth 2 (two) times daily with a meal.    Dispense:  180 tablet    Refill:  3   Orders Placed This Encounter  Procedures  . Lipid panel  . Insulin, random  . Ambulatory referral to diabetic education    Referral Priority:   Routine    Referral Type:   Consultation    Referral Reason:   Specialty Services Required    Number of Visits Requested:   1  . HM DIABETES EYE EXAM    This external order was created through the Results Console.    Follow up plan: Return in about 4 months (around 11/03/2017), or if symptoms worsen or fail to improve.  Gary BoydenJavier Marchello Rothgeb, MD

## 2017-07-04 NOTE — Patient Instructions (Addendum)
We will refer you to diabetes classes in [place].  We will check labs today. If cholesterol remains high - we may start lipitor statin cholesterol medicine to control these readings.  Return in 3-4 months for follow up diabetes.

## 2017-07-05 LAB — INSULIN, RANDOM: Insulin: 5.1 u[IU]/mL (ref 2.0–19.6)

## 2017-07-06 ENCOUNTER — Telehealth (INDEPENDENT_AMBULATORY_CARE_PROVIDER_SITE_OTHER): Payer: Self-pay | Admitting: Orthopaedic Surgery

## 2017-07-06 NOTE — Telephone Encounter (Signed)
Patient came in stating he has different insurance now. Updated insurance and patient stated he wants the gel shot and see if this is covered under new insurance

## 2017-07-06 NOTE — Telephone Encounter (Signed)
Is this okay?

## 2017-07-06 NOTE — Telephone Encounter (Signed)
yes

## 2017-07-06 NOTE — Telephone Encounter (Signed)
Please submit gel injection again for patient with new ins card- Dr Roda ShuttersXu.

## 2017-07-08 ENCOUNTER — Other Ambulatory Visit: Payer: Self-pay | Admitting: Family Medicine

## 2017-07-08 MED ORDER — ATORVASTATIN CALCIUM 40 MG PO TABS: 40.0000 mg | ORAL_TABLET | Freq: Every day | ORAL | 3 refills | Status: DC

## 2017-07-10 ENCOUNTER — Encounter: Payer: BLUE CROSS/BLUE SHIELD | Attending: Family Medicine | Admitting: Dietician

## 2017-07-10 ENCOUNTER — Encounter: Payer: Self-pay | Admitting: Dietician

## 2017-07-10 VITALS — BP 128/84 | Ht 69.0 in | Wt 190.4 lb

## 2017-07-10 DIAGNOSIS — E119 Type 2 diabetes mellitus without complications: Secondary | ICD-10-CM | POA: Insufficient documentation

## 2017-07-10 DIAGNOSIS — Z713 Dietary counseling and surveillance: Secondary | ICD-10-CM | POA: Insufficient documentation

## 2017-07-10 NOTE — Progress Notes (Signed)
Diabetes Self-Management Education  Visit Type:    Appt. Start Time: 1100 Appt. End Time: 1200  07/10/2017  Mr. Eston Mouldrincewill Witherington, identified by name and date of birth, is a 47 y.o. male with a diagnosis of Diabetes:  .   ASSESSMENT  Blood pressure 128/84, height 5\' 9"  (1.753 m), weight 190 lb 6.4 oz (86.4 kg). Body mass index is 28.12 kg/m.  Diabetes Self-Management Education - 07/10/17 1219      Health Coping   How would you rate your overall health?  Excellent      Psychosocial Assessment   Patient Belief/Attitude about Diabetes  Motivated to manage diabetes    Self-care barriers  None    Self-management support  Doctor's office;Family    Other persons present  Family Member    Patient Concerns  Glycemic Control;Medication;Weight Control;Healthy Lifestyle prevent complications    Special Needs  None    Preferred Learning Style  Visual;Hands on;Auditory    Learning Readiness  Ready    What is the last grade level you completed in school?  masters degree-pt is a [employer]      Pre-Education Assessment   Patient understands the diabetes disease and treatment process.  Needs Review    Patient understands incorporating nutritional management into lifestyle.  Needs Instruction    Patient undertands incorporating physical activity into lifestyle.  Needs Instruction    Patient understands using medications safely.  Needs Review    Patient understands monitoring blood glucose, interpreting and using results  Needs Instruction    Patient understands prevention, detection, and treatment of acute complications.  Needs Review    Patient understands prevention, detection, and treatment of chronic complications.  Needs Review    Patient understands how to develop strategies to address psychosocial issues.  Needs Review    Patient understands how to develop strategies to promote health/change behavior.  Needs Review      Complications   Last HgB A1C per patient/outside source  7.3 % 07-04-17     How often do you check your blood sugar?  0 times/day (not testing) sporadic checks BG with wife's meter-results 120-180    Have you had a dilated eye exam in the past 12 months?  Yes 06-2017    Have you had a dental exam in the past 12 months?  No 06-2016    Are you checking your feet?  Yes    How many days per week are you checking your feet?  7      Dietary Intake   Breakfast  eats breakfast at 9a=egg and vegetables or cereal and almond milk    Snack (morning)  none    Lunch  eats occasional lunch at 7p    Snack (afternoon)  none    Dinner  eats supper at 12a=plaintain or potatoes, fish or chicken + vegetables    Snack (evening)  occasional snack at 6-6:30p    Beverage(s)  drinks water 6-7x/day and almond milk 1x/day      Exercise   Exercise Type  ADL's limited due to right knee pain      Patient Education   Previous Diabetes Education  No    Disease state   Definition of diabetes, type 1 and 2, and the diagnosis of diabetes;Explored patient's options for treatment of their diabetes;Factors that contribute to the development of diabetes    Nutrition management   Role of diet in the treatment of diabetes and the relationship between the three main macronutrients and blood glucose level;Food  label reading, portion sizes and measuring food.;Carbohydrate counting    Physical activity and exercise   Role of exercise on diabetes management, blood pressure control and cardiac health.;Helped patient identify appropriate exercises in relation to his/her diabetes, diabetes complications and other health issue.    Medications  Reviewed patients medication for diabetes, action, purpose, timing of dose and side effects.    Monitoring  Taught/evaluated SMBG meter.;Identified appropriate SMBG and/or A1C goals.;Taught/discussed recording of test results and interpretation of SMBG.;Yearly dilated eye exam;Purpose and frequency of SMBG. gave pt Conout Next EZ meter and instructed on its use use-FBG 124     Chronic complications  Relationship between chronic complications and blood glucose control;Dental care;Reviewed with patient heart disease, higher risk of, and prevention;Retinopathy and reason for yearly dilated eye exams;Lipid levels, blood glucose control and heart disease    Personal strategies to promote health  Lifestyle issues that need to be addressed for better diabetes care;Helped patient develop diabetes management plan for (enter comment)      Outcomes   Expected Outcomes  Demonstrated interest in learning. Expect positive outcomes       Individualized Plan for Diabetes Self-Management Training:   Learning Objective:  Patient will have a greater understanding of diabetes self-management. Patient education plan is to attend individual and/or group sessions per assessed needs and concerns.   Plan:   Patient Instructions  Check blood sugars 2 x day before breakfast and 2 hour after a meal daily and record  Bring blood sugar records to the next appointment/class  Call your doctor for a prescription for:  1. Meter strips (type) Contour Next test strips  checking  2 times per day  2. Lancets (type)  Microlet lancets checking  2   times per day  Exercise:  Try water exercises or swimming if able 2-3x/wk  Eat 3 meals day and   1  snack a day at bedtime  Space meals 4-6 hours apart  Avoid sugar sweetened drinks (soda, tea, coffee, sports drinks, juices)  Limit intake of sweets, snack foods  and fried foods  Eat 3 carbohydrate servings/meal + protein  Eat 1 carbohydrate serving/snack + protein  Make a  dentist appointment  Get a Sharps container  Return for appointment/classes on:  08-16-17   Expected Outcomes:  Demonstrated interest in learning. Expect positive outcomes  Education material provided: General meal planning guidelines, Contour Next EZ meter  If problems or questions, patient to contact team via:  [phone]  Future DSME appointment:  08-16-17

## 2017-07-10 NOTE — Patient Instructions (Addendum)
Check blood sugars 2 x day before breakfast and 2 hour after a meal daily and record  Bring blood sugar records to the next appointment/class  Call your doctor for a prescription for:  1. Meter strips (type) Contour Next test strips  checking  2 times per day  2. Lancets (type)  Microlet lancets checking  2   times per day  Exercise:  Try water exercises or swimming if able 2-3x/wk  Eat 3 meals day and   1  snack a day at bedtime  Space meals 4-6 hours apart  Avoid sugar sweetened drinks (soda, tea, coffee, sports drinks, juices)  Limit intake of sweets, snack foods  and fried foods  Eat 3 carbohydrate servings/meal + protein  Eat 1 carbohydrate serving/snack + protein  Make a  dentist appointment  Get a Sharps container  Return for appointment/classes on:  08-16-17

## 2017-07-11 NOTE — Telephone Encounter (Signed)
Noted  

## 2017-07-16 ENCOUNTER — Telehealth: Payer: Self-pay | Admitting: *Deleted

## 2017-07-16 ENCOUNTER — Telehealth (INDEPENDENT_AMBULATORY_CARE_PROVIDER_SITE_OTHER): Payer: Self-pay

## 2017-07-16 NOTE — Telephone Encounter (Signed)
Copied from CRM [phone]#[id number]. Topic: General - Other >> Jul 16, 2017  8:57 AM Tamela OddiHarris, Brenda J wrote: Reason for CRM: Patient called to request a prescription for the glucose test strips and needles after his diabetes diagnosis.  He was told to contact his doctor in order to get these materials.  CB# [phone].

## 2017-07-16 NOTE — Telephone Encounter (Signed)
Spoke with pt asking what brand meter he is using so we can send rx for correct test strips. Says he is not at home right now so will have to call back with that info.

## 2017-07-16 NOTE — Telephone Encounter (Signed)
Submitted application online for SynviscOne, right knee. 

## 2017-07-18 NOTE — Telephone Encounter (Signed)
Patient calling back, he uses MetallurgistCoutour Nest Easy. Would like to know if insurance covers free style.

## 2017-07-18 NOTE — Telephone Encounter (Signed)
Noted.    Sent rx for test strips to [facility] [place].  Left message for pt to call back.    Pt would need to contact his insurance to see what diabetes devices are covered, including FreeStyle Libre.

## 2017-07-19 ENCOUNTER — Other Ambulatory Visit: Payer: Self-pay | Admitting: Family Medicine

## 2017-07-19 MED ORDER — ACCU-CHEK GUIDE W/DEVICE KIT: 1.0000 | PACK | 0 refills | Status: DC | PRN

## 2017-07-19 MED ORDER — ACCU-CHEK FASTCLIX LANCETS MISC: 1.0000 | Freq: Every day | 0 refills | Status: DC

## 2017-07-19 MED ORDER — GLUCOSE BLOOD VI STRP: 1.0000 | ORAL_STRIP | Freq: Every day | 0 refills | Status: DC

## 2017-07-19 MED ORDER — GLUCOSE BLOOD VI STRP: 1.0000 | ORAL_STRIP | 0 refills | Status: DC | PRN

## 2017-07-19 NOTE — Telephone Encounter (Signed)
Received message from [facility] that pt's insurance does not cover Contour Next test strips.  Suggest Accu-Chek Guide meter and supplies.  Sent rx for meter, test strips and lancets.

## 2017-07-19 NOTE — Addendum Note (Signed)
Addended by: Nanci PinaGOINS, Jennene Downie on: 07/19/2017 12:14 PM   Modules accepted: Orders

## 2017-07-23 ENCOUNTER — Telehealth (INDEPENDENT_AMBULATORY_CARE_PROVIDER_SITE_OTHER): Payer: Self-pay

## 2017-07-23 NOTE — Telephone Encounter (Signed)
Talked with [facility]ie P.at [facility] with [employer] to initiate PA for SynviscOne, right knee, but [employer] is currently experiencing some Computer issues with submitting authorization.  Jeesie P.stated that she will return my call to complete authorization once, these issues have been resolved.

## 2017-08-07 ENCOUNTER — Telehealth (INDEPENDENT_AMBULATORY_CARE_PROVIDER_SITE_OTHER): Payer: Self-pay

## 2017-08-07 NOTE — Telephone Encounter (Signed)
Talked with Alissa C. With [employer] [place] and submitted PA over the phone.  Per Alissa C. Authorization is currently pending for SynviscOne, right knee.  Will receive a call or fax once a decision has been made. Reference# [id number]

## 2017-08-16 ENCOUNTER — Telehealth: Payer: Self-pay | Admitting: Dietician

## 2017-08-16 ENCOUNTER — Ambulatory Visit: Payer: BLUE CROSS/BLUE SHIELD

## 2017-08-16 NOTE — Telephone Encounter (Signed)
[name] did not show for his first Diabetes class. Called and left message requesting that he call to reschedule.

## 2017-08-20 ENCOUNTER — Encounter: Payer: Self-pay | Admitting: Dietician

## 2017-08-20 NOTE — Progress Notes (Signed)
Have not heard from pt-called pt and left message on voice mail for pt to call me and reschedule classes.

## 2017-08-21 ENCOUNTER — Ambulatory Visit (INDEPENDENT_AMBULATORY_CARE_PROVIDER_SITE_OTHER): Payer: Self-pay

## 2017-08-21 ENCOUNTER — Ambulatory Visit (INDEPENDENT_AMBULATORY_CARE_PROVIDER_SITE_OTHER): Payer: BLUE CROSS/BLUE SHIELD | Admitting: Orthopaedic Surgery

## 2017-08-21 ENCOUNTER — Telehealth (INDEPENDENT_AMBULATORY_CARE_PROVIDER_SITE_OTHER): Payer: Self-pay

## 2017-08-21 DIAGNOSIS — M25561 Pain in right knee: Secondary | ICD-10-CM

## 2017-08-21 DIAGNOSIS — M25562 Pain in left knee: Secondary | ICD-10-CM | POA: Diagnosis not present

## 2017-08-21 DIAGNOSIS — G8929 Other chronic pain: Secondary | ICD-10-CM | POA: Diagnosis not present

## 2017-08-21 NOTE — Telephone Encounter (Signed)
Patient is approved for SynviscOne, right knee. Buy & Bill Covered at 75% of the maximum allowable charge after the deductible Patient will be responsible for 25% OOP Patient is aware that he has to meet his deductible first.  PA Approval# [id number] per Particia Jasperhris G.with [employer] [place] Valid 08/07/2017- 02/02/2018

## 2017-08-21 NOTE — Progress Notes (Signed)
Office Visit Note   Patient: Gary Long           Date of Birth: Sep 16, 1970           MRN: [id number] Visit Date: 08/21/2017              Requested by: Eustaquio BoydenGutierrez, Javier, MD [address] [place], [place] [id number] PCP: Eustaquio BoydenGutierrez, Javier, MD   Assessment & Plan: Visit Diagnoses:  1. 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  Overall patient seems to be doing okay I would like to try a lateral unloader brace to see if this will help with his symptoms especially when he is active.  Patient in agreement.  Patient currently not interested in another cortisone injection or HA injections due to cost.  Hopefully this will give him some relief.  Follow-up as needed. Total face to face encounter time was greater than 25 minutes and over half of this time was spent in counseling and/or coordination of care.  Follow-Up Instructions: Return if symptoms worsen or fail to improve.   Orders:  Orders Placed This Encounter  Procedures  . XR Knee Complete 4 Views Left  . XR Knee Complete 4 Views Right   No orders of the defined types were placed in this encounter.     Procedures: No procedures performed   Clinical Data: No additional findings.   Subjective: Chief Complaint  Patient presents with  . Right Knee - Pain    Patient follows up today for continued bilateral knee pain worse on the right.  He has tried Voltaren gel which does not give him significant relief.  He has also been diagnosed with diabetes recently.  Is not interested in cortisone injections.  He wants to know if there are any other options.  He is a little over a year status post right knee arthroscopy with lateral tibial plateau sub-chondroplasty and microfracture.   Review of Systems   Objective: Vital Signs: There were no vitals taken for this visit.  Physical Exam  Ortho Exam Right knee exam shows no  joint effusion.  Collaterals and cruciates are stable. Left knee exam shows no joint effusion.  Essentially unremarkable. Specialty Comments:  No specialty comments available.  Imaging: Xr Knee Complete 4 Views Left  Result Date: 08/21/2017 No acute or structural abnormalities.  Minimal arthritis.  Xr Knee Complete 4 Views Right  Result Date: 08/21/2017 Status post sub-chondroplasty and significant joint space narrowing of the lateral compartment    PMFS History: Patient Active Problem List   Diagnosis Date Noted  . GERD (gastroesophageal reflux disease) 10/24/2016  . Complex tear of medial meniscus of right knee as current injury 06/15/2016  . Unilateral primary osteoarthritis, right knee 06/15/2016  . Closed fracture of right tibial plateau 06/15/2016  . Palpitations 05/10/2016  . Transaminitis 05/10/2016  . Health maintenance examination 11/27/2013  . Type 2 diabetes mellitus with other specified complication (HCC) 11/27/2013  . Frequent urination 11/27/2013  . Premature ejaculation 11/27/2013  . Lateral meniscal tear 11/27/2013  . HLD (hyperlipidemia) 06/13/2013  . Fatigue 12/10/2012   Past Medical History:  Diagnosis Date  . Diabetes mellitus type 2, controlled, without complications (HCC) 11/27/2013  . History of malaria 1999  . HLD (hyperlipidemia) 06/13/2013  . Hyperlipidemia     Family History  Problem Relation Age of Onset  . Stroke Mother   . Diabetes Mother   .  Hypertension Sister   . Cancer Neg Hx   . CAD Neg Hx     Past Surgical History:  Procedure Laterality Date  . KNEE ARTHROSCOPY Right 2002   ?ACL repair  . KNEE ARTHROSCOPY WITH DRILLING/MICROFRACTURE Right 06/21/2016   Procedure: KNEE ARTHROSCOPY WITH DRILLING/MICROFRACTURE;  Surgeon: Tarry KosXu, Chessie Neuharth M, MD;  Location: [facility];  Service: Orthopedics;  Laterality: Right;  . KNEE ARTHROSCOPY WITH MEDIAL MENISECTOMY Right 06/21/2016   Procedure: RIGHT KNEE ARTHROSCOPY WITH PARTIAL MEDIAL  AND LATERAL MENISCECTOMIES, SYNOVECTOMY,  MICROFRACTURE, SUBCHONDROPLASTY;  Surgeon: Tarry KosXu, Jaelie Aguilera M, MD;  Location: [facility];  Service: Orthopedics;  Laterality: Right;  . KNEE ARTHROSCOPY WITH SUBCHONDROPLASTY Right 06/21/2016   Procedure: KNEE ARTHROSCOPY WITH SUBCHONDROPLASTY;  Surgeon: Tarry KosXu, Rosangela Fehrenbach M, MD;  Location:  SURGERY CENTER;  Service: Orthopedics;  Laterality: Right;   Social History   Occupational History  . Not on file  Tobacco Use  . Smoking status: Never Smoker  . Smokeless tobacco: Never Used  Substance and Sexual Activity  . Alcohol use: Yes    Alcohol/week: 0.0 - 1.0 standard drinks  . Drug use: No  . Sexual activity: Not on file

## 2017-08-23 ENCOUNTER — Ambulatory Visit: Payer: BLUE CROSS/BLUE SHIELD

## 2017-08-30 ENCOUNTER — Other Ambulatory Visit: Payer: Self-pay | Admitting: Family Medicine

## 2017-08-30 ENCOUNTER — Ambulatory Visit: Payer: BLUE CROSS/BLUE SHIELD

## 2017-09-17 DIAGNOSIS — M1711 Unilateral primary osteoarthritis, right knee: Secondary | ICD-10-CM | POA: Diagnosis not present

## 2017-09-18 ENCOUNTER — Encounter: Payer: Self-pay | Admitting: Dietician

## 2017-10-29 ENCOUNTER — Ambulatory Visit (INDEPENDENT_AMBULATORY_CARE_PROVIDER_SITE_OTHER): Payer: BLUE CROSS/BLUE SHIELD | Admitting: Family Medicine

## 2017-10-29 ENCOUNTER — Encounter: Payer: Self-pay | Admitting: Family Medicine

## 2017-10-29 VITALS — BP 120/80 | HR 69 | Temp 97.8°F | Ht 69.0 in | Wt 187.8 lb

## 2017-10-29 DIAGNOSIS — Z7184 Encounter for health counseling related to travel: Secondary | ICD-10-CM | POA: Insufficient documentation

## 2017-10-29 DIAGNOSIS — R739 Hyperglycemia, unspecified: Secondary | ICD-10-CM

## 2017-10-29 DIAGNOSIS — E1169 Type 2 diabetes mellitus with other specified complication: Secondary | ICD-10-CM | POA: Diagnosis not present

## 2017-10-29 LAB — POCT GLYCOSYLATED HEMOGLOBIN (HGB A1C): Hemoglobin A1C: 7.8 % — AB (ref 4.0–5.6)

## 2017-10-29 MED ORDER — DOXYCYCLINE HYCLATE 100 MG PO TABS: 100.0000 mg | ORAL_TABLET | Freq: Every day | ORAL | 0 refills | Status: DC

## 2017-10-29 MED ORDER — TYPHOID VACCINE PO CPDR: 1.0000 | DELAYED_RELEASE_CAPSULE | ORAL | 0 refills | Status: DC

## 2017-10-29 NOTE — Patient Instructions (Addendum)
We will refer you to endocrinologist for evaluation.  Take doxycycline for malaria prevention - start 1-2 days prior to trip, take for full 4 weeks after you return.  Recommended Hep A and B for travel as well - check on immunization records Oral typhoid vaccine sent to pharmacy (vivotif, take 1 tablet every other day for 4 days starting 2 weeks prior to travel).  Call local travel clinic to see if they offer yellow fever vaccination [phone]

## 2017-10-29 NOTE — Assessment & Plan Note (Addendum)
Chronic, A1c deteriorated attributed to looser adherence to diabetic diet. For now, continue metformin 500mg  bid.  Overall healthy, endorses good diet - ?hereditary component to diabetes mellitus. Insulin level was not elevated pointing again significant insulin resistance and straight forward type 2 DM - will refer to endo for further eval - pt agrees with plan.

## 2017-10-29 NOTE — Progress Notes (Signed)
BP 120/80 (BP Location: Left Arm, Patient Position: Sitting, Cuff Size: Normal)   Pulse 69   Temp 97.8 F (36.6 C) (Oral)   Ht '5\' 9"'  (1.753 m)   Wt 187 lb 12 oz (85.2 kg) Comment: Wearing leg brace, right  SpO2 97%   BMI 27.73 kg/m    CC: 4 mo f/u visit  Subjective:    Patient ID: Gary Long, male    DOB: 1970-08-16, 47 y.o.   MRN: [id number]  HPI: Gary Long is a 47 y.o. male presenting on 10/29/2017 for Diabetes (Here 4 mo f/u. ) and Travel Consult (Pt will be traveling to [place] soon. Request malaria prevention.)   Father just passed away March 18, 2017 at age 82.  Bilateral knee pain - followed by orthopedist.  [place] at work 3 wks ago - suffered concussion - out of work for several days.   DM - does regularly check sugars a few times a week - fasting 84-120 and postprandial 110-140, last month noticing increasing readings fasting and postprandial 160-170 - attributes to worse dietary compliance. Compliant with antihyperglycemic regimen which includes: metformin 560m bid. Denies low sugars or hypoglycemic symptoms. Denies paresthesias. Last diabetic eye exam 06/2017. Pneumovax: 210-Mar-2014 Prevnar: not due. Glucometer brand: accu-chek. DSME: completed 1 class. Lab Results  Component Value Date   HGBA1C 7.8 (A) 10/29/2017   Diabetic Foot Exam - Simple   No data filed     Lab Results  Component Value Date   MICROALBUR <0.7 02/09/2017     Upcoming trip to [place]- requests malaria ppx (h/o malaria in the past).   Relevant past medical, surgical, family and social history reviewed and updated as indicated. Interim medical history since our last visit reviewed. Allergies and medications reviewed and updated. Outpatient Medications Prior to Visit  Medication Sig Dispense Refill  . ACCU-CHEK FASTCLIX LANCETS MISC 1 each by Does not apply route daily. Use as directed to check blood sugar once daily 100 each 0  . atorvastatin (LIPITOR) 40 MG tablet TAKE 1 TABLET BY MOUTH  EVERY DAY 90 tablet 3  . Blood Glucose Monitoring Suppl (ACCU-CHEK GUIDE) w/Device KIT 1 each by Does not apply route as needed. Use as directed to check blood sugar once daily 1 kit 0  . glucose blood (ACCU-CHEK GUIDE) test strip 1 each by Other route daily. Use as instructed to check blood sugar once daily 100 each 0  . metFORMIN (GLUCOPHAGE) 500 MG tablet Take 1 tablet (500 mg total) by mouth 2 (two) times daily with a meal. 180 tablet 3   No facility-administered medications prior to visit.      Per HPI unless specifically indicated in ROS section below Review of Systems     Objective:    BP 120/80 (BP Location: Left Arm, Patient Position: Sitting, Cuff Size: Normal)   Pulse 69   Temp 97.8 F (36.6 C) (Oral)   Ht '5\' 9"'  (1.753 m)   Wt 187 lb 12 oz (85.2 kg) Comment: Wearing leg brace, right  SpO2 97%   BMI 27.73 kg/m   Wt Readings from Last 3 Encounters:  10/29/17 187 lb 12 oz (85.2 kg)  07/10/17 190 lb 6.4 oz (86.4 kg)  07/04/17 188 lb (85.3 kg)    Physical Exam  Constitutional: He appears well-developed and well-nourished. No distress.  HENT:  Mouth/Throat: Oropharynx is clear and moist. No oropharyngeal exudate.  Cardiovascular: Normal rate, regular rhythm and normal heart sounds.  No murmur heard. Pulmonary/Chest: Effort normal  and breath sounds normal. No respiratory distress. He has no wheezes. He has no rales.  Musculoskeletal: He exhibits no edema.  Skin: Skin is warm and dry.  Psychiatric: He has a normal mood and affect.  Nursing note and vitals reviewed.  Results for orders placed or performed in visit on 10/29/17  POCT glycosylated hemoglobin (Hb A1C)  Result Value Ref Range   Hemoglobin A1C 7.8 (A) 4.0 - 5.6 %   HbA1c POC (<> result, manual entry)     HbA1c, POC (prediabetic range)     HbA1c, POC (controlled diabetic range)        Assessment & Plan:   Problem List Items Addressed This Visit    Type 2 diabetes mellitus with other specified  complication ([place]) - Primary    Chronic, A1c deteriorated attributed to looser adherence to diabetic diet. For now, continue metformin 569m bid.  Overall healthy, endorses good diet - ?hereditary component to diabetes mellitus. Insulin level was not elevated pointing again significant insulin resistance and straight forward type 2 DM - will refer to endo for further eval - pt agrees with plan.       Relevant Orders   POCT glycosylated hemoglobin (Hb A1C) (Completed)   Ambulatory referral to Endocrinology   Travel advice encounter    [place]. CDC travel recommendations reviewed: 1. Rx provided for doxycycline malaria ppx - reviewed taking with food to avoid GI upset and wearing sunscreen for increased photosensitivity.  2. Rx provided for oral vivotif (typhoid prophylaxis) - reviewed side effects of HA, nausea, abd pain 3. Pt will ensure he's UTD on Hep A/B 4. Provided # for local [facility] travel clinic through [facility] to ask about yellow fever vaccination prior to travel.        Other Visit Diagnoses    Hyperglycemia       Relevant Orders   Ambulatory referral to Endocrinology       Meds ordered this encounter  Medications  . doxycycline (VIBRA-TABS) 100 MG tablet    Sig: Take 1 tablet (100 mg total) by mouth daily. For travel    Dispense:  60 tablet    Refill:  0  . typhoid (VIVOTIF) DR capsule    Sig: Take 1 capsule by mouth every other day. Start 2 weeks prior to travel    Dispense:  4 capsule    Refill:  0   Orders Placed This Encounter  Procedures  . Ambulatory referral to Endocrinology    Referral Priority:   Routine    Referral Type:   Consultation    Referral Reason:   Specialty Services Required    Number of Visits Requested:   1  . POCT glycosylated hemoglobin (Hb A1C)    Follow up plan: No follow-ups on file.  JRia Bush MD

## 2017-10-29 NOTE — Assessment & Plan Note (Signed)
Upcoming trip to [place] to bury father. CDC travel recommendations reviewed: 1. Rx provided for doxycycline malaria ppx - reviewed taking with food to avoid GI upset and wearing sunscreen for increased photosensitivity.  2. Rx provided for oral vivotif (typhoid prophylaxis) - reviewed side effects of HA, nausea, abd pain 3. Pt will ensure he's UTD on Hep A/B 4. Provided # for local [facility] travel clinic through [facility] to ask about yellow fever vaccination prior to travel.

## 2017-11-26 DIAGNOSIS — E782 Mixed hyperlipidemia: Secondary | ICD-10-CM | POA: Diagnosis not present

## 2017-11-26 DIAGNOSIS — E1165 Type 2 diabetes mellitus with hyperglycemia: Secondary | ICD-10-CM | POA: Diagnosis not present

## 2017-12-19 ENCOUNTER — Other Ambulatory Visit: Payer: Self-pay | Admitting: Family Medicine

## 2017-12-19 NOTE — Telephone Encounter (Signed)
Doxycycline Last filled:  11/26/17, #30/1 Last OV:  12/29/17, f/u Next OV:  none

## 2018-01-30 ENCOUNTER — Other Ambulatory Visit: Payer: Self-pay | Admitting: Family Medicine

## 2018-01-30 NOTE — Telephone Encounter (Signed)
Electronic refill request Doxycycline Last office visit 10/29/17 Last refill 10/29/17 #60

## 2018-02-12 ENCOUNTER — Emergency Department
Admission: EM | Admit: 2018-02-12 | Discharge: 2018-02-12 | Disposition: A | Discharge status: Home / Self Care | Payer: BLUE CROSS/BLUE SHIELD | Attending: Emergency Medicine | Admitting: Emergency Medicine

## 2018-02-12 ENCOUNTER — Other Ambulatory Visit: Payer: Self-pay

## 2018-02-12 ENCOUNTER — Encounter: Payer: Self-pay | Admitting: Emergency Medicine

## 2018-02-12 DIAGNOSIS — M436 Torticollis: Secondary | ICD-10-CM

## 2018-02-12 DIAGNOSIS — Z7984 Long term (current) use of oral hypoglycemic drugs: Secondary | ICD-10-CM | POA: Diagnosis not present

## 2018-02-12 DIAGNOSIS — Z79899 Other long term (current) drug therapy: Secondary | ICD-10-CM | POA: Insufficient documentation

## 2018-02-12 DIAGNOSIS — E119 Type 2 diabetes mellitus without complications: Secondary | ICD-10-CM | POA: Insufficient documentation

## 2018-02-12 DIAGNOSIS — M542 Cervicalgia: Secondary | ICD-10-CM | POA: Diagnosis present

## 2018-02-12 MED ORDER — CYCLOBENZAPRINE HCL 10 MG PO TABS: 10.0000 mg | ORAL_TABLET | Freq: Three times a day (TID) | ORAL | 0 refills | Status: DC | PRN

## 2018-02-12 MED ORDER — CYCLOBENZAPRINE HCL 10 MG PO TABS
10.0000 mg | ORAL_TABLET | Freq: Once | ORAL | Status: AC
  Administered 2018-02-12: 10 mg via ORAL
  Filled 2018-02-12: qty 1

## 2018-02-12 MED ORDER — LIDOCAINE 5 % EX PTCH: 1.0000 | MEDICATED_PATCH | CUTANEOUS | 0 refills | Status: DC

## 2018-02-12 MED ORDER — LIDOCAINE 5 % EX PTCH
1.0000 | MEDICATED_PATCH | CUTANEOUS | Status: DC
  Administered 2018-02-12: 1 via TRANSDERMAL
  Filled 2018-02-12: qty 1

## 2018-02-12 NOTE — ED Triage Notes (Signed)
Patient ambulatory to triage with steady gait, without difficulty or distress noted; pt reports x 4-5 days having right sided neck pain with movement; denies injury; taking ibuprofen without relief

## 2018-02-13 NOTE — ED Provider Notes (Signed)
[facility] Emergency Department Provider Note   First MD Initiated Contact with Patient 02/12/18 [phone]     (approximate)  I have reviewed the triage vital signs and the nursing notes.   HISTORY  Chief Complaint Torticollis    HPI Gary Long is a 48 y.o. male with below list of previous medical conditions presents to the emergency department with 4-day history of right side neck pain that is worse with any movement.  Patient states that he believed that he awoke 4 days ago with this discomfort.  Patient denies any trauma.  Patient denies any fever afebrile on presentation temperature 98.  No headache nausea or vomiting.  Patient states that pain is unrelieved with ibuprofen at home.   Past Medical History:  Diagnosis Date  . Diabetes mellitus type 2, controlled, without complications ([place]) 34/19/3790  . History of malaria 1999  . HLD (hyperlipidemia) 06/13/2013  . Hyperlipidemia     Patient Active Problem List   Diagnosis Date Noted  . Travel advice encounter 10/29/2017  . GERD (gastroesophageal reflux disease) 10/24/2016  . Complex tear of medial meniscus of right knee as current injury 06/15/2016  . Unilateral primary osteoarthritis, right knee 06/15/2016  . Closed fracture of right tibial plateau 06/15/2016  . Palpitations 05/10/2016  . Transaminitis 05/10/2016  . Health maintenance examination 11/27/2013  . Type 2 diabetes mellitus with other specified complication ([place]) 24/09/7351  . Frequent urination 11/27/2013  . Premature ejaculation 11/27/2013  . Lateral meniscal tear 11/27/2013  . HLD (hyperlipidemia) 06/13/2013  . Fatigue 12/10/2012    Past Surgical History:  Procedure Laterality Date  . KNEE ARTHROSCOPY Right 2002   ?ACL repair  . KNEE ARTHROSCOPY WITH DRILLING/MICROFRACTURE Right 06/21/2016   Procedure: KNEE ARTHROSCOPY WITH DRILLING/MICROFRACTURE;  Surgeon: Leandrew Koyanagi, MD;  Location: [facility];   Service: Orthopedics;  Laterality: Right;  . KNEE ARTHROSCOPY WITH MEDIAL MENISECTOMY Right 06/21/2016   Procedure: RIGHT KNEE ARTHROSCOPY WITH PARTIAL MEDIAL AND LATERAL MENISCECTOMIES, SYNOVECTOMY,  MICROFRACTURE, SUBCHONDROPLASTY;  Surgeon: Leandrew Koyanagi, MD;  Location: [facility];  Service: Orthopedics;  Laterality: Right;  . KNEE ARTHROSCOPY WITH SUBCHONDROPLASTY Right 06/21/2016   Procedure: KNEE ARTHROSCOPY WITH SUBCHONDROPLASTY;  Surgeon: Leandrew Koyanagi, MD;  Location: [facility];  Service: Orthopedics;  Laterality: Right;    Prior to Admission medications   Medication Sig Start Date End Date Taking? Authorizing Provider  ACCU-CHEK FASTCLIX LANCETS MISC 1 each by Does not apply route daily. Use as directed to check blood sugar once daily 07/19/17   Ria Bush, MD  atorvastatin (LIPITOR) 40 MG tablet TAKE 1 TABLET BY MOUTH EVERY DAY 08/30/17   Ria Bush, MD  Blood Glucose Monitoring Suppl (ACCU-CHEK GUIDE) w/Device KIT 1 each by Does not apply route as needed. Use as directed to check blood sugar once daily 07/19/17   Ria Bush, MD  cyclobenzaprine (FLEXERIL) 10 MG tablet Take 1 tablet (10 mg total) by mouth 3 (three) times daily as needed. 02/12/18   Gregor Hams, MD  doxycycline (VIBRA-TABS) 100 MG tablet Take 1 tablet (100 mg total) by mouth daily. For travel 10/29/17   Ria Bush, MD  glucose blood (ACCU-CHEK GUIDE) test strip 1 each by Other route daily. Use as instructed to check blood sugar once daily 07/19/17   Ria Bush, MD  lidocaine (LIDODERM) 5 % Place 1 patch onto the skin daily. Remove & Discard patch within 12 hours or as directed by MD 02/12/18  Gregor Hams, MD  metFORMIN (GLUCOPHAGE) 500 MG tablet Take 1 tablet (500 mg total) by mouth 2 (two) times daily with a meal. 07/04/17   Ria Bush, MD  typhoid (VIVOTIF) DR capsule Take 1 capsule by mouth every other day. Start 2 weeks prior to travel 10/29/17    Ria Bush, MD    Allergies Chloroquine; Hydrocodone; and Percocet [oxycodone-acetaminophen]  Family History  Problem Relation Age of Onset  . Stroke Mother 86  . Diabetes Mother 9  . Hypertension Sister   . Diabetes Sister   . Cancer Neg Hx   . CAD Neg Hx     Social History Social History   Tobacco Use  . Smoking status: Never Smoker  . Smokeless tobacco: Never Used  Substance Use Topics  . Alcohol use: Yes    Alcohol/week: 0.0 - 1.0 standard drinks  . Drug use: No    Review of Systems Constitutional: No fever/chills Eyes: No visual changes. ENT: No sore throat. Cardiovascular: Denies chest pain. Respiratory: Denies shortness of breath. Gastrointestinal: No abdominal pain.  No nausea, no vomiting.  No diarrhea.  No constipation. Genitourinary: Negative for dysuria. Musculoskeletal: Positive for neck pain.  Negative for back pain. Integumentary: Negative for rash. Neurological: Negative for headaches, focal weakness or numbness.   ____________________________________________   PHYSICAL EXAM:  VITAL SIGNS: ED Triage Vitals  Enc Vitals Group     BP 02/12/18 0355 123/83     Pulse Rate 02/12/18 0355 75     Resp 02/12/18 0355 18     Temp 02/12/18 0355 98 F (36.7 C)     Temp Source 02/12/18 0355 Oral     SpO2 02/12/18 0355 98 %     Weight 02/12/18 0354 81.6 kg (180 lb)     Height 02/12/18 0354 1.753 m ('5\' 9"' )     Head Circumference --      Peak Flow --      Pain Score 02/12/18 0353 10     Pain Loc --      Pain Edu? --      Excl. in [place]? --      Constitutional: Alert and oriented. Well appearing and in no acute distress. Eyes: Conjunctivae are normal.  Head: Atraumatic. Neck: No stridor.  Pain to palpation right trapezius muscle Cardiovascular: Normal rate, regular rhythm. Good peripheral circulation. Grossly normal heart sounds. Respiratory: Normal respiratory effort.  No retractions. Lungs CTAB. Musculoskeletal: Pain to palpation of the  right trapezius muscle.  Pain with active and passive range of motion of the neck neurologic:  Normal speech and language. No gross focal neurologic deficits are appreciated.  Skin:  Skin is warm, dry and intact. No rash noted. Psychiatric: Mood and affect are normal. Speech and behavior are normal.  ____________________________  Procedures   ____________________________________________   INITIAL IMPRESSION / ASSESSMENT AND PLAN / ED COURSE  As part of my medical decision making, I reviewed the following data within the electronic MEDICAL RECORD NUMBER  48 year old male presenting to the emergency department with above-stated history and physical exam consistent with acute torticollis.  Patient without any focal neurological deficits.  Phillip Heal patch applied to the patient's right trapezius muscle and Flexeril 10 mg administered.  Flexeril be prescribed for home. ____________________________________________  FINAL CLINICAL IMPRESSION(S) / ED DIAGNOSES  Final diagnoses:  Acute torticollis     MEDICATIONS GIVEN DURING THIS VISIT:  Medications  cyclobenzaprine (FLEXERIL) tablet 10 mg (10 mg Oral Given 02/12/18 7414)     ED Discharge  Orders         Ordered    cyclobenzaprine (FLEXERIL) 10 MG tablet  3 times daily PRN     02/12/18 0626    lidocaine (LIDODERM) 5 %  Every 24 hours     02/12/18 6712           Note:  This document was prepared using Dragon voice recognition software and may include unintentional dictation errors.    Gregor Hams, MD 02/13/18 [phone]

## 2018-02-27 LAB — HM DIABETES EYE EXAM

## 2018-02-27 LAB — HEMOGLOBIN A1C: Hemoglobin A1C: 6.9

## 2018-02-28 ENCOUNTER — Ambulatory Visit (INDEPENDENT_AMBULATORY_CARE_PROVIDER_SITE_OTHER): Payer: BLUE CROSS/BLUE SHIELD | Admitting: Orthopaedic Surgery

## 2018-02-28 ENCOUNTER — Encounter (INDEPENDENT_AMBULATORY_CARE_PROVIDER_SITE_OTHER): Payer: Self-pay | Admitting: Orthopaedic Surgery

## 2018-02-28 DIAGNOSIS — M25561 Pain in right knee: Secondary | ICD-10-CM | POA: Diagnosis not present

## 2018-02-28 NOTE — Progress Notes (Signed)
Office Visit Note   Patient: Gary Long           Date of Birth: 12-19-1970           MRN: [id number] Visit Date: 02/28/2018              Requested by: Eustaquio Boyden, MD [address] [place], [place] [address] PCP: Eustaquio Boyden, MD   Assessment & Plan: Visit Diagnoses:  1. Acute pain of right knee     Plan: Impression is right knee contusion.  The sclerotic lesion in the lateral tibial plateau corresponds to the sub-chondroplasty that we performed in the past.  There is no concern for malignancy.  The radiologist who interpreted the x-ray was unaware of his prior surgical history.  We will place him on light duty for the next 4 weeks so that he can get better.  NSAIDs and symptomatic treatment as indicated.  Questions encouraged and answered.  Follow-up as needed. Total face to face encounter time was greater than 25 minutes and over half of this time was spent in counseling and/or coordination of care.  Follow-Up Instructions: Return if symptoms worsen or fail to improve.   Orders:  No orders of the defined types were placed in this encounter.  No orders of the defined types were placed in this encounter.     Procedures: No procedures performed   Clinical Data: No additional findings.   Subjective: Chief Complaint  Patient presents with  . Right Knee - Pain    Patient is a 48 year old gentleman who comes in for acute right knee pain that resulted from a work-related injury when he slipped and fell directly on the anterior aspect of the knee.  He had x-rays subsequently and showed no acute fracture dislocation but showed the dense sclerotic lesion in the lateral tibial plateau region.  He comes in today for further evaluation and treatment.  He denies any swelling.  He does endorse some difficulty with increased walking standing and physical activity.   Review of Systems  Constitutional: Negative.   All other systems reviewed and are  negative.    Objective: Vital Signs: There were no vitals taken for this visit.  Physical Exam Vitals signs and nursing note reviewed.  Constitutional:      Appearance: He is well-developed.  HENT:     Head: Normocephalic and atraumatic.  Eyes:     Pupils: Pupils are equal, round, and reactive to light.  Neck:     Musculoskeletal: Neck supple.  Pulmonary:     Effort: Pulmonary effort is normal.  Abdominal:     Palpations: Abdomen is soft.  Musculoskeletal: Normal range of motion.  Skin:    General: Skin is warm.  Neurological:     Mental Status: He is alert and oriented to person, place, and time.  Psychiatric:        Behavior: Behavior normal.        Thought Content: Thought content normal.        Judgment: Judgment normal.     Ortho Exam Right knee exam shows no joint effusion.  Fully healed surgical scars.  He has some mild tenderness to palpation of the proximal tibia region.  No significant joint line tenderness. Specialty Comments:  No specialty comments available.  Imaging: No results found.   PMFS History: Patient Active Problem List   Diagnosis Date Noted  . Travel advice encounter 10/29/2017  . GERD (gastroesophageal reflux disease) 10/24/2016  . Complex tear  of medial meniscus of right knee as current injury 06/15/2016  . Unilateral primary osteoarthritis, right knee 06/15/2016  . Palpitations 05/10/2016  . Transaminitis 05/10/2016  . Health maintenance examination 11/27/2013  . Type 2 diabetes mellitus with other specified complication ([facility]) 11/27/2013  . Frequent urination 11/27/2013  . Premature ejaculation 11/27/2013  . Lateral meniscal tear 11/27/2013  . HLD (hyperlipidemia) 06/13/2013  . Fatigue 12/10/2012   Past Medical History:  Diagnosis Date  . Diabetes mellitus type 2, controlled, without complications ([facility]) 11/27/2013  . History of malaria 1999  . HLD (hyperlipidemia) 06/13/2013  . Hyperlipidemia     Family History  Problem  Relation Age of Onset  . Stroke Mother 34  . Diabetes Mother 51  . Hypertension Sister   . Diabetes Sister   . Cancer Neg Hx   . CAD Neg Hx     Past Surgical History:  Procedure Laterality Date  . KNEE ARTHROSCOPY Right 2002   ?ACL repair  . KNEE ARTHROSCOPY WITH DRILLING/MICROFRACTURE Right 06/21/2016   Procedure: KNEE ARTHROSCOPY WITH DRILLING/MICROFRACTURE;  Surgeon: Tarry Kos, MD;  Location: [facility];  Service: Orthopedics;  Laterality: Right;  . KNEE ARTHROSCOPY WITH MEDIAL MENISECTOMY Right 06/21/2016   Procedure: RIGHT KNEE ARTHROSCOPY WITH PARTIAL MEDIAL AND LATERAL MENISCECTOMIES, SYNOVECTOMY,  MICROFRACTURE, SUBCHONDROPLASTY;  Surgeon: Tarry Kos, MD;  Location: [facility];  Service: Orthopedics;  Laterality: Right;  . KNEE ARTHROSCOPY WITH SUBCHONDROPLASTY Right 06/21/2016   Procedure: KNEE ARTHROSCOPY WITH SUBCHONDROPLASTY;  Surgeon: Tarry Kos, MD;  Location: [facility];  Service: Orthopedics;  Laterality: Right;   Social History   Occupational History  . Not on file  Tobacco Use  . Smoking status: Never Smoker  . Smokeless tobacco: Never Used  Substance and Sexual Activity  . Alcohol use: Yes    Alcohol/week: 0.0 - 1.0 standard drinks  . Drug use: No  . Sexual activity: Not on file

## 2018-03-01 ENCOUNTER — Ambulatory Visit (INDEPENDENT_AMBULATORY_CARE_PROVIDER_SITE_OTHER): Payer: BLUE CROSS/BLUE SHIELD | Admitting: Orthopaedic Surgery

## 2018-03-04 ENCOUNTER — Encounter: Payer: Self-pay | Admitting: Family Medicine

## 2018-03-04 ENCOUNTER — Ambulatory Visit (INDEPENDENT_AMBULATORY_CARE_PROVIDER_SITE_OTHER): Payer: BLUE CROSS/BLUE SHIELD | Admitting: Family Medicine

## 2018-03-04 VITALS — BP 118/82 | HR 79 | Temp 97.5°F | Resp 14 | Ht 69.0 in | Wt 179.8 lb

## 2018-03-04 DIAGNOSIS — M79642 Pain in left hand: Secondary | ICD-10-CM | POA: Diagnosis not present

## 2018-03-04 NOTE — Progress Notes (Signed)
Subjective:    Patient ID: Gary Long, male    DOB: April 29, 1970, 48 y.o.   MRN: [id number]  HPI This is a 48 yo male who presents today with left hand pain since last night.Started around 11 pm while he was at work.  Pain is in palm of hand. Side of hand is numb and tingling. Took ibuprofen 800 mg x 1 last night. This is new. No trauma or overuse. No weakness. No elbow, shoulder or neck pain.  He is a Firefighter at [employer].   Past Medical History:  Diagnosis Date  . Diabetes mellitus type 2, controlled, without complications ([facility]) 11/27/2013  . History of malaria 1999  . HLD (hyperlipidemia) 06/13/2013  . Hyperlipidemia    Past Surgical History:  Procedure Laterality Date  . KNEE ARTHROSCOPY Right 2002   ?ACL repair  . KNEE ARTHROSCOPY WITH DRILLING/MICROFRACTURE Right 06/21/2016   Procedure: KNEE ARTHROSCOPY WITH DRILLING/MICROFRACTURE;  Surgeon: Tarry Kos, MD;  Location: [facility];  Service: Orthopedics;  Laterality: Right;  . KNEE ARTHROSCOPY WITH MEDIAL MENISECTOMY Right 06/21/2016   Procedure: RIGHT KNEE ARTHROSCOPY WITH PARTIAL MEDIAL AND LATERAL MENISCECTOMIES, SYNOVECTOMY,  MICROFRACTURE, SUBCHONDROPLASTY;  Surgeon: Tarry Kos, MD;  Location: [facility];  Service: Orthopedics;  Laterality: Right;  . KNEE ARTHROSCOPY WITH SUBCHONDROPLASTY Right 06/21/2016   Procedure: KNEE ARTHROSCOPY WITH SUBCHONDROPLASTY;  Surgeon: Tarry Kos, MD;  Location: [facility];  Service: Orthopedics;  Laterality: Right;   Family History  Problem Relation Age of Onset  . Stroke Mother 60  . Diabetes Mother 61  . Hypertension Sister   . Diabetes Sister   . Cancer Neg Hx   . CAD Neg Hx    Social History   Tobacco Use  . Smoking status: Never Smoker  . Smokeless tobacco: Never Used  Substance Use Topics  . Alcohol use: Yes    Alcohol/week: 0.0 - 1.0 standard drinks  . Drug use: No      Review of Systems Per HPI    Objective:    Physical Exam Vitals signs reviewed.  Constitutional:      General: He is not in acute distress.    Appearance: Normal appearance. He is normal weight. He is not ill-appearing, toxic-appearing or diaphoretic.  HENT:     Head: Normocephalic and atraumatic.  Cardiovascular:     Rate and Rhythm: Normal rate.  Pulmonary:     Effort: Pulmonary effort is normal.  Musculoskeletal:     Left hand: He exhibits normal range of motion, no bony tenderness, normal capillary refill, no deformity, no laceration and no swelling. Normal sensation noted. Normal strength noted.       Hands:  Skin:    General: Skin is warm and dry.     Capillary Refill: Capillary refill takes less than 2 seconds.  Neurological:     Mental Status: He is alert.  Psychiatric:        Mood and Affect: Mood normal.        Behavior: Behavior normal.        Thought Content: Thought content normal.        Judgment: Judgment normal.       BP 118/82 (BP Location: Right Arm, Patient Position: Sitting)   Pulse 79   Temp (!) 97.5 F (36.4 C) (Oral)   Resp 14   Ht 5\' 9"  (1.753 m)   Wt 179 lb 12 oz (81.5 kg)   SpO2 97%  BMI 26.54 kg/m  Wt Readings from Last 3 Encounters:  03/04/18 179 lb 12 oz (81.5 kg)  02/12/18 180 lb (81.6 kg)  10/29/17 187 lb 12 oz (85.2 kg)       Assessment & Plan:  1. Left hand pain - unclear etiology, no worrisome findings -  Patient Instructions  I hope your hand is feeling better soon  I am not sure what is causing your pain, but want to treat for inflammation. Take ibuprofen 600 mg every 8 to 12 hours for a couple of days, apply ice for 15 minutes several times a day.   If no improvement in 3-5 days please let me know. If any signs of infection like redness, swelling, drainage or fever, please let me know.        Olean Ree, FNP-BC  [facility], [facility]  03/05/2018 12:51 PM

## 2018-03-04 NOTE — Patient Instructions (Signed)
I hope your hand is feeling better soon  I am not sure what is causing your pain, but want to treat for inflammation. Take ibuprofen 600 mg every 8 to 12 hours for a couple of days, apply ice for 15 minutes several times a day.   If no improvement in 3-5 days please let me know. If any signs of infection like redness, swelling, drainage or fever, please let me know.

## 2018-03-04 NOTE — Progress Notes (Signed)
Subjective:    Patient ID: Gary Long, male    DOB: 01/19/1970, 48 y.o.   MRN: [id number]  HPI This is a 48 yo male patient being seen today for deep-seeded left hand pain with periods of numbness and tingling on the palmer and lateral aspect of the hand. This began last night (03/03/18) at 2300. He took 800 mg of ibuprofen at 0400 without relief.   Past Medical History:  Diagnosis Date  . Diabetes mellitus type 2, controlled, without complications (HCC) 11/27/2013  . History of malaria 1999  . HLD (hyperlipidemia) 06/13/2013  . Hyperlipidemia    Past Surgical History:  Procedure Laterality Date  . KNEE ARTHROSCOPY Right 2002   ?ACL repair  . KNEE ARTHROSCOPY WITH DRILLING/MICROFRACTURE Right 06/21/2016   Procedure: KNEE ARTHROSCOPY WITH DRILLING/MICROFRACTURE;  Surgeon: Tarry Kos, MD;  Location: [facility];  Service: Orthopedics;  Laterality: Right;  . KNEE ARTHROSCOPY WITH MEDIAL MENISECTOMY Right 06/21/2016   Procedure: RIGHT KNEE ARTHROSCOPY WITH PARTIAL MEDIAL AND LATERAL MENISCECTOMIES, SYNOVECTOMY,  MICROFRACTURE, SUBCHONDROPLASTY;  Surgeon: Tarry Kos, MD;  Location: [facility];  Service: Orthopedics;  Laterality: Right;  . KNEE ARTHROSCOPY WITH SUBCHONDROPLASTY Right 06/21/2016   Procedure: KNEE ARTHROSCOPY WITH SUBCHONDROPLASTY;  Surgeon: Tarry Kos, MD;  Location: [facility];  Service: Orthopedics;  Laterality: Right;   Family History  Problem Relation Age of Onset  . Stroke Mother 60  . Diabetes Mother 50  . Hypertension Sister   . Diabetes Sister   . Cancer Neg Hx   . CAD Neg Hx    Social History   Tobacco Use  . Smoking status: Never Smoker  . Smokeless tobacco: Never Used  Substance Use Topics  . Alcohol use: Yes    Alcohol/week: 0.0 - 1.0 standard drinks  . Drug use: No    Review of Systems  Musculoskeletal:       Deep pain on palmer surface reproduced by patient.  Skin: Negative.   All other  systems reviewed and are negative.  Per HPI    Objective:   Physical Exam Vitals signs reviewed.  Constitutional:      Appearance: Normal appearance.  Musculoskeletal:        General: Tenderness present.     Comments: Tenderness/numbness/tingling reproduced by patient  Skin:    General: Skin is warm and dry.  Neurological:     Mental Status: He is alert.     BP 118/82 (BP Location: Right Arm, Patient Position: Sitting)   Pulse 79   Temp (!) 97.5 F (36.4 C) (Oral)   Resp 14   Ht 5\' 9"  (1.753 m)   Wt 179 lb 12 oz (81.5 kg)   SpO2 97%   BMI 26.54 kg/m  BP Readings from Last 3 Encounters:  03/04/18 118/82  02/12/18 123/83  10/29/17 120/80   Wt Readings from Last 3 Encounters:  03/04/18 179 lb 12 oz (81.5 kg)  02/12/18 180 lb (81.6 kg)  10/29/17 187 lb 12 oz (85.2 kg)           Assessment & Plan:  Left hand pain  Patient Instructions  I hope your hand is feeling better soon  I am not sure what is causing your pain, but want to treat for inflammation. Take ibuprofen 600 mg every 8 to 12 hours for a couple of days, apply ice for 15 minutes several times a day.   If no improvement in 3-5 days please let me  know. If any signs of infection like redness, swelling, drainage or fever, please let me know.

## 2018-03-05 ENCOUNTER — Encounter: Payer: Self-pay | Admitting: Family Medicine

## 2018-03-29 ENCOUNTER — Encounter: Payer: Self-pay | Admitting: Family Medicine

## 2018-03-29 ENCOUNTER — Ambulatory Visit (INDEPENDENT_AMBULATORY_CARE_PROVIDER_SITE_OTHER)
Admission: RE | Admit: 2018-03-29 | Discharge: 2018-03-29 | Disposition: A | Discharge status: Home / Self Care | Payer: Self-pay | Source: Ambulatory Visit | Attending: Family Medicine | Admitting: Family Medicine

## 2018-03-29 ENCOUNTER — Telehealth: Payer: Self-pay | Admitting: Family Medicine

## 2018-03-29 ENCOUNTER — Other Ambulatory Visit: Payer: Self-pay

## 2018-03-29 ENCOUNTER — Ambulatory Visit (INDEPENDENT_AMBULATORY_CARE_PROVIDER_SITE_OTHER): Payer: Worker's Compensation | Admitting: Family Medicine

## 2018-03-29 VITALS — BP 122/84 | HR 80 | Temp 97.6°F | Ht 69.0 in | Wt 179.0 lb

## 2018-03-29 DIAGNOSIS — M25561 Pain in right knee: Secondary | ICD-10-CM

## 2018-03-29 DIAGNOSIS — M1711 Unilateral primary osteoarthritis, right knee: Secondary | ICD-10-CM

## 2018-03-29 DIAGNOSIS — J302 Other seasonal allergic rhinitis: Secondary | ICD-10-CM | POA: Insufficient documentation

## 2018-03-29 DIAGNOSIS — E1169 Type 2 diabetes mellitus with other specified complication: Secondary | ICD-10-CM

## 2018-03-29 MED ORDER — CETIRIZINE HCL 10 MG PO TABS: 10.0000 mg | ORAL_TABLET | Freq: Every day | ORAL | Status: DC

## 2018-03-29 MED ORDER — FLUTICASONE PROPIONATE 50 MCG/ACT NA SUSP: 2.0000 | Freq: Every day | NASAL | 3 refills | Status: DC

## 2018-03-29 NOTE — Assessment & Plan Note (Signed)
Appreciate endo care. Will update our records with recent labs

## 2018-03-29 NOTE — Assessment & Plan Note (Signed)
Tender along patellar tendon - ?jumper's knee after initial fall. Previously dx with knee contusion. Given prior knee history, check xrays today. Will refer to PT. If not improving, recommend re evaluation with ortho.

## 2018-03-29 NOTE — Progress Notes (Signed)
BP 122/84 (BP Location: Left Arm, Patient Position: Sitting, Cuff Size: Normal)   Pulse 80   Temp 97.6 F (36.4 C) (Oral)   Ht '5\' 9"'  (1.753 m)   Wt 179 lb (81.2 kg)   SpO2 97%   BMI 26.43 kg/m    CC: R knee pain, cough Subjective:    Patient ID: Gary Long, male    DOB: July 22, 1970, 48 y.o.   MRN: [id number]  HPI: Gary Long is a 48 y.o. male presenting on 03/29/2018 for Knee Pain (C/o right knee pain due to a fall at work last month. ) and Cough (C/o cough and runny nose. Sxs started 2 wks ago. Tried ibuprofen and Zyrtec. )   DOI: ?02/21/2018 Slipped last month at work (possibly on water). Knee painful since then. Saw orthopedist last month with xrays ([facility]) - told had bruising and rec conservative measures, rec f/u with PCP if pain continued. Persistent pain anteriorly. Initially swollen with redness and warmth, now better. Has tried ibuprofen 620m OTC without benefit.   H/o R knee surgery 2002, 2018 for meniscal repair  2 wk h/o cough, rhinorrhea. No fevers/chills, dyspnea, no recent travel. Works at [employer] Tried zyrtec and ibuprofen without benefit.   DM - on metformin and jardiance sees Dr SGabriel Carina Last A1c 6.9% (02/27/2018).      Relevant past medical, surgical, family and social history reviewed and updated as indicated. Interim medical history since our last visit reviewed. Allergies and medications reviewed and updated. Outpatient Medications Prior to Visit  Medication Sig Dispense Refill  . ACCU-CHEK FASTCLIX LANCETS MISC 1 each by Does not apply route daily. Use as directed to check blood sugar once daily 100 each 0  . atorvastatin (LIPITOR) 40 MG tablet TAKE 1 TABLET BY MOUTH EVERY DAY 90 tablet 3  . Blood Glucose Monitoring Suppl (ACCU-CHEK GUIDE) w/Device KIT 1 each by Does not apply route as needed. Use as directed to check blood sugar once daily 1 kit 0  . cyclobenzaprine (FLEXERIL) 10 MG tablet Take 1 tablet (10 mg total) by mouth 3  (three) times daily as needed. 30 tablet 0  . empagliflozin (JARDIANCE) 10 MG TABS tablet Take 10 mg by mouth daily.    .Marland Kitchenglucose blood (ACCU-CHEK GUIDE) test strip 1 each by Other route daily. Use as instructed to check blood sugar once daily 100 each 0  . lidocaine (LIDODERM) 5 % Place 1 patch onto the skin daily. Remove & Discard patch within 12 hours or as directed by MD 30 patch 0  . metFORMIN (GLUCOPHAGE) 500 MG tablet Take 1 tablet (500 mg total) by mouth 2 (two) times daily with a meal. 180 tablet 3  . doxycycline (VIBRA-TABS) 100 MG tablet Take 1 tablet (100 mg total) by mouth daily. For travel 60 tablet 0  . typhoid (VIVOTIF) DR capsule Take 1 capsule by mouth every other day. Start 2 weeks prior to travel 4 capsule 0   No facility-administered medications prior to visit.      Per HPI unless specifically indicated in ROS section below Review of Systems Objective:    BP 122/84 (BP Location: Left Arm, Patient Position: Sitting, Cuff Size: Normal)   Pulse 80   Temp 97.6 F (36.4 C) (Oral)   Ht '5\' 9"'  (1.753 m)   Wt 179 lb (81.2 kg)   SpO2 97%   BMI 26.43 kg/m   Wt Readings from Last 3 Encounters:  03/29/18 179 lb (81.2 kg)  03/04/18  179 lb 12 oz (81.5 kg)  02/12/18 180 lb (81.6 kg)    Physical Exam Vitals signs and nursing note reviewed.  Constitutional:      General: He is not in acute distress.    Appearance: Normal appearance. He is well-developed.  HENT:     Head: Normocephalic and atraumatic.     Right Ear: Hearing, tympanic membrane, ear canal and external ear normal.     Left Ear: Hearing, tympanic membrane, ear canal and external ear normal.     Nose: Mucosal edema (and pallor) present. No congestion or rhinorrhea.     Right Sinus: No maxillary sinus tenderness or frontal sinus tenderness.     Left Sinus: No maxillary sinus tenderness or frontal sinus tenderness.     Mouth/Throat:     Mouth: Mucous membranes are moist.     Pharynx: Oropharynx is clear. Uvula  midline. No oropharyngeal exudate or posterior oropharyngeal erythema.     Tonsils: No tonsillar abscesses.  Eyes:     General: No scleral icterus.    Conjunctiva/sclera: Conjunctivae normal.     Pupils: Pupils are equal, round, and reactive to light.  Neck:     Musculoskeletal: Normal range of motion and neck supple.  Cardiovascular:     Rate and Rhythm: Normal rate and regular rhythm.     Pulses: Normal pulses.     Heart sounds: Normal heart sounds. No murmur.  Pulmonary:     Effort: Pulmonary effort is normal. No respiratory distress.     Breath sounds: Normal breath sounds. No wheezing, rhonchi or rales.  Musculoskeletal: Normal range of motion.        General: Tenderness present. No swelling.     Comments: L knee WNL R knee exam: No deformity on inspection. Tender to palpation of anterior knee along patellar tendon No effusion/swelling noted. FROM in flex/extension without crepitus but marked pain with extension. No popliteal fullness. Neg drawer test. Tender with mcmurray test. No pain with valgus/varus stress. No PFgrind. No abnormal patellar mobility.   Lymphadenopathy:     Cervical: No cervical adenopathy.  Skin:    General: Skin is warm and dry.     Findings: No rash.  Neurological:     Mental Status: He is alert.       Results for orders placed or performed in visit on 03/29/18  Hemoglobin A1c  Result Value Ref Range   Hemoglobin A1C 6.9   HM DIABETES EYE EXAM  Result Value Ref Range   HM Diabetic Eye Exam No Retinopathy No Retinopathy   Assessment & Plan:   Problem List Items Addressed This Visit    Unilateral primary osteoarthritis, right knee   Relevant Orders   DG Knee Complete 4 Views Right   Ambulatory referral to Physical Therapy   Type 2 diabetes mellitus with other specified complication ([place])    Appreciate endo care. Will update our records with recent labs       Relevant Medications   empagliflozin (JARDIANCE) 10 MG TABS tablet    Seasonal allergic rhinitis    Cough, rhinorrhea, nasal mucosal pallor/edema - anticipate allergic rhinitis flare with commencement of spring. Supportive care reviewed including allergen avoidance, zyrtec and flonase use. Update if not improving.       Acute pain of right knee - Primary    Tender along patellar tendon - ?jumper's knee after initial fall. Previously dx with knee contusion. Given prior knee history, check xrays today. Will refer to PT. If not improving, recommend  re evaluation with ortho.       Relevant Orders   DG Knee Complete 4 Views Right   Ambulatory referral to Physical Therapy       Meds ordered this encounter  Medications  . cetirizine (ZYRTEC) 10 MG tablet    Sig: Take 1 tablet (10 mg total) by mouth daily.    Dispense:  30 tablet  . fluticasone (FLONASE) 50 MCG/ACT nasal spray    Sig: Place 2 sprays into both nostrils daily.    Dispense:  16 g    Refill:  3   Orders Placed This Encounter  Procedures  . DG Knee Complete 4 Views Right    Standing Status:   Future    Number of Occurrences:   1    Standing Expiration Date:   05/29/2019    Order Specific Question:   Reason for Exam (SYMPTOM  OR DIAGNOSIS REQUIRED)    Answer:   R anterior knee pain    Order Specific Question:   Preferred imaging location?    Answer:   [facility]    Order Specific Question:   Radiology Contrast Protocol - do NOT remove file path    Answer:   [email or web address]  . Hemoglobin A1c    This external order was created through the Results Console.  . Ambulatory referral to Physical Therapy    Referral Priority:   Routine    Referral Type:   Physical Medicine    Referral Reason:   Specialty Services Required    Requested Specialty:   Physical Therapy    Number of Visits Requested:   1  . HM DIABETES EYE EXAM    This external order was created through the Results Console.    Patient Instructions  For cough - I think this is due  to allergies. Take measures for allergen avoidance. Continue zyrtec , start flonase nasal steroid.  For knee - start taking prescription anti inflammatory sent to pharmacy. We will refer you for physical therapy. Knee xray today. If not improving with this, return to see Dr Erlinda Hong.    Follow up plan: Return if symptoms worsen or fail to improve.  Ria Bush, MD

## 2018-03-29 NOTE — Assessment & Plan Note (Signed)
Cough, rhinorrhea, nasal mucosal pallor/edema - anticipate allergic rhinitis flare with commencement of spring. Supportive care reviewed including allergen avoidance, zyrtec and flonase use. Update if not improving.

## 2018-03-29 NOTE — Patient Instructions (Addendum)
For cough - I think this is due to allergies. Take measures for allergen avoidance. Continue zyrtec , start flonase nasal steroid.  For knee - start taking prescription anti inflammatory sent to pharmacy. We will refer you for physical therapy. Knee xray today. If not improving with this, return to see Dr Roda Shutters.

## 2018-03-29 NOTE — Telephone Encounter (Signed)
Lvm asking pt to call office. Need to know employer name.

## 2018-04-14 ENCOUNTER — Other Ambulatory Visit: Payer: Self-pay | Admitting: Family Medicine

## 2018-07-28 ENCOUNTER — Other Ambulatory Visit: Payer: Self-pay | Admitting: Family Medicine

## 2018-08-08 ENCOUNTER — Encounter: Payer: Self-pay | Admitting: Family Medicine

## 2018-08-08 ENCOUNTER — Other Ambulatory Visit: Payer: Self-pay

## 2018-08-08 ENCOUNTER — Ambulatory Visit (INDEPENDENT_AMBULATORY_CARE_PROVIDER_SITE_OTHER): Payer: PRIVATE HEALTH INSURANCE | Admitting: Family Medicine

## 2018-08-08 DIAGNOSIS — M25512 Pain in left shoulder: Secondary | ICD-10-CM | POA: Insufficient documentation

## 2018-08-08 MED ORDER — DICLOFENAC SODIUM 1 % TD GEL: 2.0000 g | Freq: Three times a day (TID) | TRANSDERMAL | 0 refills | Status: DC

## 2018-08-08 MED ORDER — NAPROXEN 500 MG PO TABS: ORAL_TABLET | ORAL | 0 refills | Status: DC

## 2018-08-08 NOTE — Assessment & Plan Note (Addendum)
No red flags. Not consistent with frozen shoulder.  Anticipate biceps tendonitis.  rec naprosyn BID with meals x 1 wk, voltaren topically PRN. Provided with SM pt advisor exercises on biceps tendonitis.  Advised f/u with sports med if not improving with this. Pt agrees with plan.

## 2018-08-08 NOTE — Progress Notes (Signed)
This visit was conducted in person.  BP 122/74 (BP Location: Right Arm, Patient Position: Sitting, Cuff Size: Normal)   Pulse 94   Temp 98.2 F (36.8 C) (Temporal)   Ht '5\' 9"'$  (1.753 m)   Wt 185 lb 2 oz (84 kg)   SpO2 96%   BMI 27.34 kg/m    CC: L shoulder pain Subjective:    Patient ID: Gary Long, male    DOB: 08-24-1970, 48 y.o.   MRN: [id number]  HPI: Gary Long is a 48 y.o. male presenting on 08/08/2018 for Shoulder Pain (C/o left shoulder pain.  Pain occurs with lateral abduction. Started 2 wks ago.  Tried ibuprofen, not helpful. )   2 wk h/o L shoulder pain points to lateral deltoid, more noticeable with lateral abduction of shoulder. No R shoulder pain. Denies inciting trauma/injury or falls. Some neck pain L>R, no significant midline cervical neck pain. No numbness down arms. Feels L arm weak.   Treated with ibuprofen ('600mg'$  once daily) and topical gels (capsaisin, bengay, icy hot) without benefit.   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   R handed     Relevant past medical, surgical, family and social history reviewed and updated as indicated. Interim medical history since our last visit reviewed. Allergies and medications reviewed and updated. Outpatient Medications Prior to Visit  Medication Sig Dispense Refill  . ACCU-CHEK FASTCLIX LANCETS MISC 1 each by Does not apply route daily. Use as directed to check blood sugar once daily 100 each 0  . atorvastatin (LIPITOR) 40 MG tablet TAKE 1 TABLET BY MOUTH EVERY DAY 90 tablet 0  . Blood Glucose Monitoring Suppl (ACCU-CHEK GUIDE) w/Device KIT 1 each by Does not apply route as needed. Use as directed to check blood sugar once daily 1 kit 0  . cyclobenzaprine (FLEXERIL) 10 MG tablet Take 1 tablet (10 mg total) by mouth 3 (three) times daily as needed. 30 tablet 0  . glipiZIDE (GLUCOTROL) 5 MG tablet Take 1 tablet by mouth daily before breakfast.    . glucose blood (ACCU-CHEK GUIDE) test strip 1  each by Other route daily. Use as instructed to check blood sugar once daily 100 each 0  . lidocaine (LIDODERM) 5 % Place 1 patch onto the skin daily. Remove & Discard patch within 12 hours or as directed by MD 30 patch 0  . metFORMIN (GLUCOPHAGE) 500 MG tablet Take 1 tablet (500 mg total) by mouth 2 (two) times daily with a meal. 180 tablet 3  . cetirizine (ZYRTEC) 10 MG tablet Take 1 tablet (10 mg total) by mouth daily. 30 tablet   . empagliflozin (JARDIANCE) 10 MG TABS tablet Take 10 mg by mouth daily.    . fluticasone (FLONASE) 50 MCG/ACT nasal spray Place 2 sprays into both nostrils daily. 16 g 3   No facility-administered medications prior to visit.      Per HPI unless specifically indicated in ROS section below Review of Systems Objective:    BP 122/74 (BP Location: Right Arm, Patient Position: Sitting, Cuff Size: Normal)   Pulse 94   Temp 98.2 F (36.8 C) (Temporal)   Ht '5\' 9"'$  (1.753 m)   Wt 185 lb 2 oz (84 kg)   SpO2 96%   BMI 27.34 kg/m   Wt Readings from Last 3 Encounters:  08/08/18 185 lb 2 oz (84 kg)  03/29/18 179 lb (81.2 kg)  03/04/18 179 lb 12 oz (81.5 kg)    Physical  Exam Vitals signs and nursing note reviewed.  Constitutional:      Appearance: Normal appearance. He is not ill-appearing.  Musculoskeletal: Normal range of motion.        General: Tenderness present. No swelling or deformity.     Comments:  No significant midline cervical neck pain. FROM at cervical neck.  R shoulder WNL L shoulder exam: No deformity of shoulders on inspection. Tender with palpation of AC joint and posterior shoulder and along entire L upper arm, no focal point tenderness however. FROM in abduction and forward flexion, pain with lifting arm past midline. Pain/weakness with testing SITS in ext rotation. + pain with empty can sign. ++ Speed test. Discomfort with impingement test. No pain with rotation of humeral head in [place] joint.   Skin:    Findings: No rash.  Neurological:      General: No focal deficit present.     Mental Status: He is alert.  Psychiatric:        Mood and Affect: Mood normal.        Behavior: Behavior normal.       Results for orders placed or performed in visit on 03/29/18  Hemoglobin A1c  Result Value Ref Range   Hemoglobin A1C 6.9   HM DIABETES EYE EXAM  Result Value Ref Range   HM Diabetic Eye Exam No Retinopathy No Retinopathy   Assessment & Plan:   Problem List Items Addressed This Visit    Acute pain of left shoulder    No red flags. Not consistent with frozen shoulder.  Anticipate biceps tendonitis.  rec naprosyn BID with meals x 1 wk, voltaren topically PRN. Provided with SM pt advisor exercises on biceps tendonitis.  Advised f/u with sports med if not improving with this. Pt agrees with plan.           Meds ordered this encounter  Medications  . naproxen (NAPROSYN) 500 MG tablet    Sig: Take one po bid x 1 week then prn pain, take with food    Dispense:  40 tablet    Refill:  0  . diclofenac sodium (VOLTAREN) 1 % GEL    Sig: Apply 2 g topically 3 (three) times daily.    Dispense:  100 g    Refill:  0   No orders of the defined types were placed in this encounter.  Patient Instructions  I think you have biceps tendonitis on left Treat with prescription naprosyn '500mg'$  twice daily as well as voltaren gel to L arm.  Do exercises provided today. If no improvement, return to see Dr Lorelei Pont sports medicine doctor.    Follow up plan: No follow-ups on file.  Ria Bush, MD

## 2018-08-08 NOTE — Patient Instructions (Signed)
I think you have biceps tendonitis on left Treat with prescription naprosyn 500mg  twice daily as well as voltaren gel to L arm.  Do exercises provided today. If no improvement, return to see Dr Lorelei Pont sports medicine doctor.

## 2018-08-22 ENCOUNTER — Ambulatory Visit (INDEPENDENT_AMBULATORY_CARE_PROVIDER_SITE_OTHER): Payer: PRIVATE HEALTH INSURANCE | Admitting: Family Medicine

## 2018-08-22 ENCOUNTER — Encounter: Payer: Self-pay | Admitting: Family Medicine

## 2018-08-22 ENCOUNTER — Other Ambulatory Visit: Payer: Self-pay

## 2018-08-22 VITALS — BP 130/100 | HR 70 | Temp 98.0°F | Ht 69.0 in | Wt 187.8 lb

## 2018-08-22 DIAGNOSIS — M7502 Adhesive capsulitis of left shoulder: Secondary | ICD-10-CM

## 2018-08-22 DIAGNOSIS — M7552 Bursitis of left shoulder: Secondary | ICD-10-CM

## 2018-08-22 DIAGNOSIS — E11618 Type 2 diabetes mellitus with other diabetic arthropathy: Secondary | ICD-10-CM

## 2018-08-22 DIAGNOSIS — M7542 Impingement syndrome of left shoulder: Secondary | ICD-10-CM

## 2018-08-22 MED ORDER — METHYLPREDNISOLONE ACETATE 40 MG/ML IJ SUSP
80.0000 mg | Freq: Once | INTRAMUSCULAR | Status: AC
  Administered 2018-08-22: 11:00:00 80 mg via INTRA_ARTICULAR

## 2018-08-22 MED ORDER — GLIPIZIDE 5 MG PO TABS: 5.0000 mg | ORAL_TABLET | Freq: Every day | ORAL | 0 refills | Status: DC

## 2018-08-22 NOTE — Progress Notes (Signed)
Gaspar Fowle T. Karn Derk, MD [facility] [place], [address] Phone: [phone]  FAX: [phone]  Gary Long - 48 y.o. male  MRN [id number]  Date of Birth: Oct 29, 1970  Visit Date: 08/22/2018  PCP: Ria Bush, MD  Referred by: Ria Bush, MD  Chief Complaint  Patient presents with  . Shoulder Pain     Left x 3 weeks-no injury   Subjective:   Gary Long is a 48 y.o. very pleasant male patient with Body mass index is 27.73 kg/m. who presents with the following:  He is a nice guy from [place] who presents with 3 or 4 weeks of left side lateral shoulder pain.  To his knowledge he has not had any sort of trauma but he does have significant pain with abduction.  He does have some modest restriction of motion and he does have pain in a T-shirt distribution down his arm.  His strength is reasonably preserved throughout.  He is never had any prior fractures, significant injuries or surgery.  He does have some trouble sleeping when he is laying on it.  He is not tried any anti-inflammatories or other oral medicines or over-the-counter medication.  He does do some lifting at work.  Intra shoulder, L ? SLAP Early frozen RTC tendinopathy  Lab Results  Component Value Date   HGBA1C 6.9 02/27/2018     Past Medical History, Surgical History, Social History, Family History, Problem List, Medications, and Allergies have been reviewed and updated if relevant.  Patient Active Problem List   Diagnosis Date Noted  . Acute pain of left shoulder 08/08/2018  . Seasonal allergic rhinitis 03/29/2018  . Travel advice encounter 10/29/2017  . GERD (gastroesophageal reflux disease) 10/24/2016  . Complex tear of medial meniscus of right knee as current injury 06/15/2016  . Unilateral primary osteoarthritis, right knee 06/15/2016  . Palpitations 05/10/2016  . Transaminitis  05/10/2016  . Acute pain of right knee 04/20/2016  . Health maintenance examination 11/27/2013  . Type 2 diabetes mellitus with other specified complication ([place]) 44/81/8563  . Frequent urination 11/27/2013  . Premature ejaculation 11/27/2013  . Lateral meniscal tear 11/27/2013  . HLD (hyperlipidemia) 06/13/2013  . Fatigue 12/10/2012    Past Medical History:  Diagnosis Date  . Diabetes mellitus type 2, controlled, without complications ([place]) 14/97/0263  . History of malaria 1999  . HLD (hyperlipidemia) 06/13/2013  . Hyperlipidemia     Past Surgical History:  Procedure Laterality Date  . KNEE ARTHROSCOPY Right 2002   ?ACL repair  . KNEE ARTHROSCOPY WITH DRILLING/MICROFRACTURE Right 06/21/2016   Procedure: KNEE ARTHROSCOPY WITH DRILLING/MICROFRACTURE;  Surgeon: Leandrew Koyanagi, MD;  Location: [facility];  Service: Orthopedics;  Laterality: Right;  . KNEE ARTHROSCOPY WITH MEDIAL MENISECTOMY Right 06/21/2016   Procedure: RIGHT KNEE ARTHROSCOPY WITH PARTIAL MEDIAL AND LATERAL MENISCECTOMIES, SYNOVECTOMY,  MICROFRACTURE, SUBCHONDROPLASTY;  Surgeon: Leandrew Koyanagi, MD;  Location: [facility];  Service: Orthopedics;  Laterality: Right;  . KNEE ARTHROSCOPY WITH SUBCHONDROPLASTY Right 06/21/2016   Procedure: KNEE ARTHROSCOPY WITH SUBCHONDROPLASTY;  Surgeon: Leandrew Koyanagi, MD;  Location: [facility];  Service: Orthopedics;  Laterality: Right;    Social History   Socioeconomic History  . Marital status: Married    Spouse name: Not on file  . Number of children: Not on file  . Years of education: Not on file  . Highest education level: Not on  file  Occupational History  . Not on file  Social Needs  . Financial resource strain: Not on file  . Food insecurity    Worry: Not on file    Inability: Not on file  . Transportation needs    Medical: Not on file    Non-medical: Not on file  Tobacco Use  . Smoking status: Never Smoker  . Smokeless tobacco:  Never Used  Substance and Sexual Activity  . Alcohol use: Yes    Alcohol/week: 0.0 - 1.0 standard drinks  . Drug use: No  . Sexual activity: Not on file  Lifestyle  . Physical activity    Days per week: Not on file    Minutes per session: Not on file  . Stress: Not on file  Relationships  . Social Herbalist on phone: Not on file    Gets together: Not on file    Attends religious service: Not on file    Active member of club or organization: Not on file    Attends meetings of clubs or organizations: Not on file    Relationship status: Not on file  . Intimate partner violence    Fear of current or ex partner: Not on file    Emotionally abused: Not on file    Physically abused: Not on file    Forced sexual activity: Not on file  Other Topics Concern  . Not on file  Social History Narrative   Caffeine: occasionally   Lives with wife and 3 daughters   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[place]    Activity: gym    Diet: good water, fruits/vegetables daily   From [place]    Family History  Problem Relation Age of Onset  . Stroke Mother 39  . Diabetes Mother 3  . Hypertension Sister   . Diabetes Sister   . Cancer Neg Hx   . CAD Neg Hx     Allergies  Allergen Reactions  . Chloroquine Itching  . Hydrocodone Other (See Comments)    Headache  . Percocet [Oxycodone-Acetaminophen] Other (See Comments)    Headache    Medication list reviewed and updated in full in [facility].  GEN: No fevers, chills. Nontoxic. Primarily MSK c/o today. MSK: Detailed in the HPI GI: tolerating PO intake without difficulty Neuro: No numbness, parasthesias, or tingling associated. Otherwise the pertinent positives of the ROS are noted above.   Objective:   BP (!) 130/100   Pulse 70   Temp 98 F (36.7 C) (Temporal)   Ht _0  (1.753 m)   Wt 187 lb 12 oz (85.2 kg)   SpO2 97%   BMI 27.73 kg/m    GEN: WDWN, NAD, Non-toxic, Alert & Oriented x 3 HEENT: Atraumatic,  Normocephalic.  Ears and Nose: No external deformity. EXTR: No clubbing/cyanosis/edema NEURO: Normal gait.  PSYCH: Normally interactive. Conversant. Not depressed or anxious appearing.  Calm demeanor.   Shoulder: LEFT Inspection: No muscle wasting or winging Ecchymosis/edema: neg  AC joint, scapula, clavicle: NT Cervical spine: NT, full ROM Spurling's: neg Abduction: 5/5 Flexion: 5/5 IR lift-off: 5/5 ER at neutral: 5/5 He does have some modest restriction of motion in all directions.  This is most notable in the plane of abduction and flexion. AC crossover and compression: neg Neer: + Hawkins: + Drop Test: neg Empty Can: neg Supraspinatus insertion: NT Bicipital groove: NT Speed's: + Yergason's: neg Sulcus sign: neg Scapular dyskinesis: none C5-T1 intact Sensation intact Grip 5/5  Radiology: No results found.   Assessment and Plan:     ICD-10-CM   1. Impingement syndrome of left shoulder  M75.42 Ambulatory referral to Physical Therapy    methylPREDNISolone acetate (DEPO-MEDROL) injection 80 mg  2. Subacromial bursitis of left shoulder joint  M75.52 Ambulatory referral to Physical Therapy  3. 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  Certainly he has some rotator cuff tendinopathy.  He does have some restriction of motion in his shoulder and knee likely is developing some early secondary adhesive capsulitis.  I have clinical concern for possible SLAP lesion.  I think it is more likely based on exam that he has a SLAP lesion rather than purely biceps tendinopathy.  For now we will do an intra-articular injection and have him do formal physical therapy to help stabilize the shoulder and improve his range of motion.  Intraarticular Shoulder Aspiration/Injection Procedure Note Gary Long 1970/11/28 Date  of procedure: 08/22/2018  Procedure: Large Joint Aspiration / Injection of Shoulder, Intraarticular, LEFT Indications: Pain  Procedure Details Verbal consent was obtained from the patient. Risks including infection explained and contrasted with benefits and alternatives. Patient prepped with Chloraprep and Ethyl Chloride used for anesthesia. An intraarticular shoulder injection was performed using the posterior approach; needle placed into joint capsule without difficulty. The patient tolerated the procedure well and had decreased pain post injection. No complications. Injection: 8 cc of Lidocaine 1% and 2 mL Depo-Medrol 40 mg. Needle: 21 gauge, 2 inch  Follow-up: Return in about 6 weeks (around 10/03/2018).  Meds ordered this encounter  Medications  . glipiZIDE (GLUCOTROL) 5 MG tablet    Sig: Take 1 tablet (5 mg total) by mouth daily before breakfast.    Dispense:  90 tablet    Refill:  0  . methylPREDNISolone acetate (DEPO-MEDROL) injection 80 mg   Orders Placed This Encounter  Procedures  . Ambulatory referral to Physical Therapy    Signed,  Frederico Hamman T. Janie Capp, MD   Outpatient Encounter Medications as of 08/22/2018  Medication Sig  . ACCU-CHEK FASTCLIX LANCETS MISC 1 each by Does not apply route daily. Use as directed to check blood sugar once daily  . atorvastatin (LIPITOR) 40 MG tablet TAKE 1 TABLET BY MOUTH EVERY DAY  . Blood Glucose Monitoring Suppl (ACCU-CHEK GUIDE) w/Device KIT 1 each by Does not apply route as needed. Use as directed to check blood sugar once daily  . diclofenac sodium (VOLTAREN) 1 % GEL Apply 2 g topically 3 (three) times daily.  Marland Kitchen glipiZIDE (GLUCOTROL) 5 MG tablet Take 1 tablet (5 mg total) by mouth daily before breakfast.  . glucose blood (ACCU-CHEK GUIDE) test strip 1 each by Other route daily. Use as instructed to check blood sugar once daily  . metFORMIN (GLUCOPHAGE) 500 MG tablet Take 1 tablet (500 mg total) by mouth 2 (two) times daily with a  meal.  . naproxen (NAPROSYN) 500 MG tablet Take one po bid x 1 week then prn pain, take with food  . [DISCONTINUED] cyclobenzaprine (FLEXERIL) 10 MG tablet Take 1 tablet (10 mg total) by mouth 3 (three) times daily as needed.  . [DISCONTINUED] glipiZIDE (GLUCOTROL) 5 MG tablet Take 1 tablet by mouth daily before breakfast.  . [DISCONTINUED] lidocaine (LIDODERM) 5 % Place 1 patch onto the skin daily. Remove & Discard patch within 12 hours  or as directed by MD  . [EXPIRED] methylPREDNISolone acetate (DEPO-MEDROL) injection 80 mg    No facility-administered encounter medications on file as of 08/22/2018.

## 2018-11-04 ENCOUNTER — Other Ambulatory Visit: Payer: Self-pay

## 2018-11-04 DIAGNOSIS — Z20822 Contact with and (suspected) exposure to covid-19: Secondary | ICD-10-CM

## 2018-11-05 ENCOUNTER — Ambulatory Visit (INDEPENDENT_AMBULATORY_CARE_PROVIDER_SITE_OTHER): Payer: No Typology Code available for payment source

## 2018-11-05 ENCOUNTER — Telehealth: Payer: Self-pay | Admitting: Family Medicine

## 2018-11-05 ENCOUNTER — Other Ambulatory Visit: Payer: Self-pay

## 2018-11-05 DIAGNOSIS — Z23 Encounter for immunization: Secondary | ICD-10-CM | POA: Diagnosis not present

## 2018-11-05 LAB — NOVEL CORONAVIRUS, NAA: SARS-CoV-2, NAA: NOT DETECTED

## 2018-11-05 MED ORDER — GLIPIZIDE 5 MG PO TABS: 5.0000 mg | ORAL_TABLET | Freq: Every day | ORAL | 0 refills | Status: DC

## 2018-11-05 MED ORDER — METFORMIN HCL 500 MG PO TABS: 500.0000 mg | ORAL_TABLET | Freq: Two times a day (BID) | ORAL | 0 refills | Status: DC

## 2018-11-05 MED ORDER — ATORVASTATIN CALCIUM 40 MG PO TABS: 40.0000 mg | ORAL_TABLET | Freq: Every day | ORAL | 0 refills | Status: DC

## 2018-11-05 NOTE — Telephone Encounter (Signed)
Pt needs the below refills.  Metformin Lipitor Glipizide  Sent to [facility]  Pt is also wants his AIC check. He told me he wanted an early morning appt the first appt Dr. Darnell Level had was 11/29/18 @ 7:15 in which I scheduled him but he is very persistent to get it done tomorrow 11/06/18 in the lab. Please advise.  CB [phone]

## 2018-11-05 NOTE — Telephone Encounter (Signed)
E-scribed refills.  Spoke with pt informing him we can do A1c at [facility].  Pt verbalizes understanding/satisfaction.  Also, notified refills sent.

## 2018-11-29 ENCOUNTER — Ambulatory Visit (INDEPENDENT_AMBULATORY_CARE_PROVIDER_SITE_OTHER): Payer: No Typology Code available for payment source | Admitting: Family Medicine

## 2018-11-29 ENCOUNTER — Other Ambulatory Visit: Payer: Self-pay

## 2018-11-29 ENCOUNTER — Encounter: Payer: Self-pay | Admitting: Family Medicine

## 2018-11-29 VITALS — BP 118/72 | HR 82 | Temp 98.2°F | Ht 69.0 in | Wt 179.2 lb

## 2018-11-29 DIAGNOSIS — E785 Hyperlipidemia, unspecified: Secondary | ICD-10-CM

## 2018-11-29 DIAGNOSIS — R3589 Other polyuria: Secondary | ICD-10-CM

## 2018-11-29 DIAGNOSIS — E1169 Type 2 diabetes mellitus with other specified complication: Secondary | ICD-10-CM | POA: Diagnosis not present

## 2018-11-29 DIAGNOSIS — E1165 Type 2 diabetes mellitus with hyperglycemia: Secondary | ICD-10-CM

## 2018-11-29 DIAGNOSIS — E118 Type 2 diabetes mellitus with unspecified complications: Secondary | ICD-10-CM | POA: Diagnosis not present

## 2018-11-29 DIAGNOSIS — IMO0002 Reserved for concepts with insufficient information to code with codable children: Secondary | ICD-10-CM

## 2018-11-29 DIAGNOSIS — R350 Frequency of micturition: Secondary | ICD-10-CM | POA: Diagnosis not present

## 2018-11-29 DIAGNOSIS — R358 Other polyuria: Secondary | ICD-10-CM

## 2018-11-29 LAB — POC URINALSYSI DIPSTICK (AUTOMATED)
Bilirubin, UA: NEGATIVE
Blood, UA: NEGATIVE
Glucose, UA: POSITIVE — AB
Leukocytes, UA: NEGATIVE
Nitrite, UA: NEGATIVE
Protein, UA: NEGATIVE
Spec Grav, UA: 1.02 (ref 1.010–1.025)
Urobilinogen, UA: 0.2 E.U./dL
pH, UA: 6 (ref 5.0–8.0)

## 2018-11-29 LAB — POCT GLYCOSYLATED HEMOGLOBIN (HGB A1C): Hemoglobin A1C: 12.8 % — AB (ref 4.0–5.6)

## 2018-11-29 LAB — COMPREHENSIVE METABOLIC PANEL
ALT: 22 U/L (ref 0–53)
AST: 17 U/L (ref 0–37)
Albumin: 4.4 g/dL (ref 3.5–5.2)
Alkaline Phosphatase: 100 U/L (ref 39–117)
BUN: 11 mg/dL (ref 6–23)
CO2: 27 mEq/L (ref 19–32)
Calcium: 9.5 mg/dL (ref 8.4–10.5)
Chloride: 97 mEq/L (ref 96–112)
Creatinine, Ser: 1.15 mg/dL (ref 0.40–1.50)
GFR: 82.01 mL/min (ref >60.00)
Glucose, Bld: 354 mg/dL — ABNORMAL HIGH (ref 70–99)
Potassium: 3.7 mEq/L (ref 3.5–5.1)
Sodium: 135 mEq/L (ref 135–145)
Total Bilirubin: 0.5 mg/dL (ref 0.2–1.2)
Total Protein: 7.8 g/dL (ref 6.0–8.3)

## 2018-11-29 LAB — LIPID PANEL
Cholesterol: 159 mg/dL (ref 0–200)
HDL: 46.1 mg/dL (ref >39.00)
LDL Cholesterol: 92 mg/dL (ref 0–99)
NonHDL: 112.48
Total CHOL/HDL Ratio: 3
Triglycerides: 103 mg/dL (ref 0.0–149.0)
VLDL: 20.6 mg/dL (ref 0.0–40.0)

## 2018-11-29 LAB — MICROALBUMIN / CREATININE URINE RATIO
Creatinine,U: 102.5 mg/dL
Microalb Creat Ratio: 0.7 mg/g (ref 0.0–30.0)
Microalb, Ur: <0.7 mg/dL (ref 0.0–1.9)

## 2018-11-29 MED ORDER — METFORMIN HCL 500 MG PO TABS: 500.0000 mg | ORAL_TABLET | Freq: Two times a day (BID) | ORAL | 3 refills | Status: DC

## 2018-11-29 MED ORDER — ATORVASTATIN CALCIUM 40 MG PO TABS: 40.0000 mg | ORAL_TABLET | Freq: Every day | ORAL | 3 refills | Status: DC

## 2018-11-29 MED ORDER — GLIPIZIDE 5 MG PO TABS: 5.0000 mg | ORAL_TABLET | Freq: Every day | ORAL | 3 refills | Status: DC

## 2018-11-29 MED ORDER — GLIMEPIRIDE 2 MG PO TABS: 2.0000 mg | ORAL_TABLET | Freq: Every day | ORAL | 3 refills | Status: DC

## 2018-11-29 NOTE — Assessment & Plan Note (Signed)
Update UA. Anticipate due to uncontrolled hyperglycemia.

## 2018-11-29 NOTE — Assessment & Plan Note (Signed)
Chronic, deteriorated. He had run out of meds for 3 months. Advised to let us know if he ever runs out of meds, reviewed importance of taking daily DM meds. Will restart metformin and glimepiride. Prior on jardiance but became unaffordable. RTC 3 mo DM f/u visit. Pt agrees with plan.

## 2018-11-29 NOTE — Assessment & Plan Note (Signed)
Update FLP, recently restarted lipitor.  The 10-year ASCVD risk score Gary Long., et al., 2013) is: 8%   Values used to calculate the score:     Age: 48 years     Sex: Male     Is Non-Hispanic African American: Yes     Diabetic: Yes     Tobacco smoker: No     Systolic Blood Pressure: 338 mmHg     Is BP treated: No     HDL Cholesterol: 55.6 mg/dL     Total Cholesterol: 243 mg/dL

## 2018-11-29 NOTE — Progress Notes (Signed)
This visit was conducted in person.  BP 118/72 (BP Location: Left Arm, Patient Position: Sitting, Cuff Size: Large)   Pulse 82   Temp 98.2 F (36.8 C) (Temporal)   Ht _0  (1.753 m)   Wt 179 lb 4 oz (81.3 kg)   SpO2 95%   BMI 26.47 kg/m    CC: DM f/u visit Subjective:    Patient ID: Gary Long, male    DOB: 06/25/1970, 48 y.o.   MRN: [id number]  HPI: Gary Long is a 49 y.o. male presenting on 11/29/2018 for Diabetes (Here for f/u.)   DM - does not regularly check sugars. Compliant with antihyperglycemic regimen which includes: metformin 518m bid. Ran out of glipizide 3 months ago. Ran out of metformin for 3 months, restarted 2 wks ago. jVania Reabecame unaffordable. Denies low sugars or hypoglycemic symptoms. Denies paresthesias. Notes polyuria - has been drinking more water. Last diabetic eye exam 02/2018. Pneumovax: 2014. Prevnar: not due. Glucometer brand: accu-chek. [facility] didn't finish.  Lab Results  Component Value Date   HGBA1C 12.8 (A) 11/29/2018   Diabetic Foot Exam - Simple   Simple Foot Form Diabetic Foot exam was performed with the following findings: Yes 11/29/2018  7:38 AM  Visual Inspection No deformities, no ulcerations, no other skin breakdown bilaterally: Yes Sensation Testing Intact to touch and monofilament testing bilaterally: Yes Pulse Check Posterior Tibialis and Dorsalis pulse intact bilaterally: Yes Comments    Lab Results  Component Value Date   MICROALBUR <0.7 02/09/2017         Relevant past medical, surgical, family and social history reviewed and updated as indicated. Interim medical history since our last visit reviewed. Allergies and medications reviewed and updated. Outpatient Medications Prior to Visit  Medication Sig Dispense Refill  . diclofenac sodium (VOLTAREN) 1 % GEL Apply 2 g topically 3 (three) times daily. 100 g 0  . naproxen (NAPROSYN) 500 MG tablet Take one po bid x 1 week then  prn pain, take with food 40 tablet 0  . atorvastatin (LIPITOR) 40 MG tablet Take 1 tablet (40 mg total) by mouth daily. 90 tablet 0  . glipiZIDE (GLUCOTROL) 5 MG tablet Take 1 tablet (5 mg total) by mouth daily before breakfast. 90 tablet 0  . metFORMIN (GLUCOPHAGE) 500 MG tablet Take 1 tablet (500 mg total) by mouth 2 (two) times daily with a meal. 180 tablet 0  . ACCU-CHEK FASTCLIX LANCETS MISC 1 each by Does not apply route daily. Use as directed to check blood sugar once daily (Patient not taking: Reported on 11/29/2018) 100 each 0  . Blood Glucose Monitoring Suppl (ACCU-CHEK GUIDE) w/Device KIT 1 each by Does not apply route as needed. Use as directed to check blood sugar once daily (Patient not taking: Reported on 11/29/2018) 1 kit 0  . glucose blood (ACCU-CHEK GUIDE) test strip 1 each by Other route daily. Use as instructed to check blood sugar once daily (Patient not taking: Reported on 11/29/2018) 100 each 0   No facility-administered medications prior to visit.      Per HPI unless specifically indicated in ROS section below Review of Systems Objective:    BP 118/72 (BP Location: Left Arm, Patient Position: Sitting, Cuff Size: Large)   Pulse 82   Temp 98.2 F (36.8 C) (Temporal)   Ht _1  (1.753 m)   Wt 179 lb 4 oz (81.3 kg)   SpO2 95%   BMI 26.47 kg/m   Wt  Readings from Last 3 Encounters:  11/29/18 179 lb 4 oz (81.3 kg)  08/22/18 187 lb 12 oz (85.2 kg)  08/08/18 185 lb 2 oz (84 kg)    Physical Exam Vitals signs and nursing note reviewed.  Constitutional:      General: He is not in acute distress.    Appearance: Normal appearance. He is well-developed. He is not ill-appearing.  HENT:     Head: Normocephalic and atraumatic.     Mouth/Throat:     Mouth: Mucous membranes are moist.     Pharynx: Oropharynx is clear. No posterior oropharyngeal erythema.  Eyes:     General: No scleral icterus.    Extraocular Movements: Extraocular movements intact.      Conjunctiva/sclera: Conjunctivae normal.     Pupils: Pupils are equal, round, and reactive to light.  Neck:     Musculoskeletal: Normal range of motion and neck supple.  Cardiovascular:     Rate and Rhythm: Normal rate and regular rhythm.     Pulses: Normal pulses.     Heart sounds: Normal heart sounds. No murmur.  Pulmonary:     Effort: Pulmonary effort is normal. No respiratory distress.     Breath sounds: Normal breath sounds. No wheezing, rhonchi or rales.  Musculoskeletal:        General: No signs of injury.     Right lower leg: No edema.     Left lower leg: No edema.     Comments: See HPI for foot exam if done  Lymphadenopathy:     Cervical: No cervical adenopathy.  Skin:    General: Skin is warm and dry.     Findings: No rash.  Neurological:     Mental Status: He is alert.  Psychiatric:        Mood and Affect: Mood normal.        Behavior: Behavior normal.       Results for orders placed or performed in visit on 11/29/18  POCT glycosylated hemoglobin (Hb A1C)  Result Value Ref Range   Hemoglobin A1C 12.8 (A) 4.0 - 5.6 %   HbA1c POC (<> result, manual entry)     HbA1c, POC (prediabetic range)     HbA1c, POC (controlled diabetic range)    POCT Urinalysis Dipstick (Automated)  Result Value Ref Range   Color, UA yellow    Clarity, UA clear    Glucose, UA Positive (A) Negative   Bilirubin, UA negative    Ketones, UA 2+    Spec Grav, UA 1.020 1.010 - 1.025   Blood, UA negative    pH, UA 6.0 5.0 - 8.0   Protein, UA Negative Negative   Urobilinogen, UA 0.2 0.2 or 1.0 E.U./dL   Nitrite, UA negative    Leukocytes, UA Negative Negative   Assessment & Plan:  This visit occurred during the SARS-CoV-2 public health emergency.  Safety protocols were in place, including screening questions prior to the visit, additional usage of staff PPE, and extensive cleaning of exam room while observing appropriate contact time as indicated for disinfecting solutions.   Problem List  Items Addressed This Visit    Uncontrolled type 2 diabetes mellitus with complication ([place]) - Primary    Chronic, deteriorated. He had run out of meds for 3 months. Advised to let us know if he ever runs out of meds, reviewed importance of taking daily DM meds. Will restart metformin and glimepiride. Prior on jardiance but became unaffordable. RTC 3 mo DM f/u visit. Pt  agrees with plan.       Relevant Medications   metFORMIN (GLUCOPHAGE) 500 MG tablet   atorvastatin (LIPITOR) 40 MG tablet   glimepiride (AMARYL) 2 MG tablet   Other Relevant Orders   POCT glycosylated hemoglobin (Hb A1C) (Completed)   Lipid panel   Comprehensive metabolic panel   Microalbumin / creatinine urine ratio   Hyperlipidemia associated with type 2 diabetes mellitus ([place])    Update FLP, recently restarted lipitor.  The 10-year ASCVD risk score Mikey Bussing DC Brooke Bonito., et al., 2013) is: 8%   Values used to calculate the score:     Age: 62 years     Sex: Male     Is Non-Hispanic African American: Yes     Diabetic: Yes     Tobacco smoker: No     Systolic Blood Pressure: 638 mmHg     Is BP treated: No     HDL Cholesterol: 55.6 mg/dL     Total Cholesterol: 243 mg/dL       Relevant Medications   metFORMIN (GLUCOPHAGE) 500 MG tablet   atorvastatin (LIPITOR) 40 MG tablet   glimepiride (AMARYL) 2 MG tablet   Frequent urination    Update UA. Anticipate due to uncontrolled hyperglycemia.        Other Visit Diagnoses    Polyuria       Relevant Orders   POCT Urinalysis Dipstick (Automated) (Completed)       Meds ordered this encounter  Medications  . DISCONTD: glipiZIDE (GLUCOTROL) 5 MG tablet    Sig: Take 1 tablet (5 mg total) by mouth daily before breakfast.    Dispense:  90 tablet    Refill:  3  . metFORMIN (GLUCOPHAGE) 500 MG tablet    Sig: Take 1 tablet (500 mg total) by mouth 2 (two) times daily with a meal.    Dispense:  180 tablet    Refill:  3  . atorvastatin (LIPITOR) 40 MG tablet    Sig: Take 1 tablet  (40 mg total) by mouth daily.    Dispense:  90 tablet    Refill:  3  . glimepiride (AMARYL) 2 MG tablet    Sig: Take 1 tablet (2 mg total) by mouth daily before breakfast.    Dispense:  90 tablet    Refill:  3    In place of glipizide.   Orders Placed This Encounter  Procedures  . Lipid panel  . Comprehensive metabolic panel  . Microalbumin / creatinine urine ratio  . POCT glycosylated hemoglobin (Hb A1C)  . POCT Urinalysis Dipstick (Automated)    Follow up plan: No follow-ups on file.  Ria Bush, MD

## 2018-11-29 NOTE — Patient Instructions (Addendum)
Keep following diabetic diet.  I've refilled metformin, glimepiride and lipitor. Return in 3 months for diabetes check.

## 2018-11-30 ENCOUNTER — Other Ambulatory Visit: Payer: Self-pay | Admitting: Family Medicine

## 2018-12-03 ENCOUNTER — Encounter: Payer: Self-pay | Admitting: Family Medicine

## 2018-12-09 ENCOUNTER — Other Ambulatory Visit: Payer: Self-pay

## 2018-12-09 DIAGNOSIS — Z20822 Contact with and (suspected) exposure to covid-19: Secondary | ICD-10-CM

## 2018-12-11 LAB — NOVEL CORONAVIRUS, NAA: SARS-CoV-2, NAA: NOT DETECTED

## 2019-01-19 ENCOUNTER — Other Ambulatory Visit: Payer: Self-pay | Admitting: Family Medicine

## 2019-02-09 IMAGING — DX DG KNEE COMPLETE 4+V*R*
4 series · 4 of 4 positions shown · non-contrast
Comparison: None.

CLINICAL DATA: Chronic pain

EXAM:
RIGHT KNEE - COMPLETE 4+ VIEW

[knee ap]
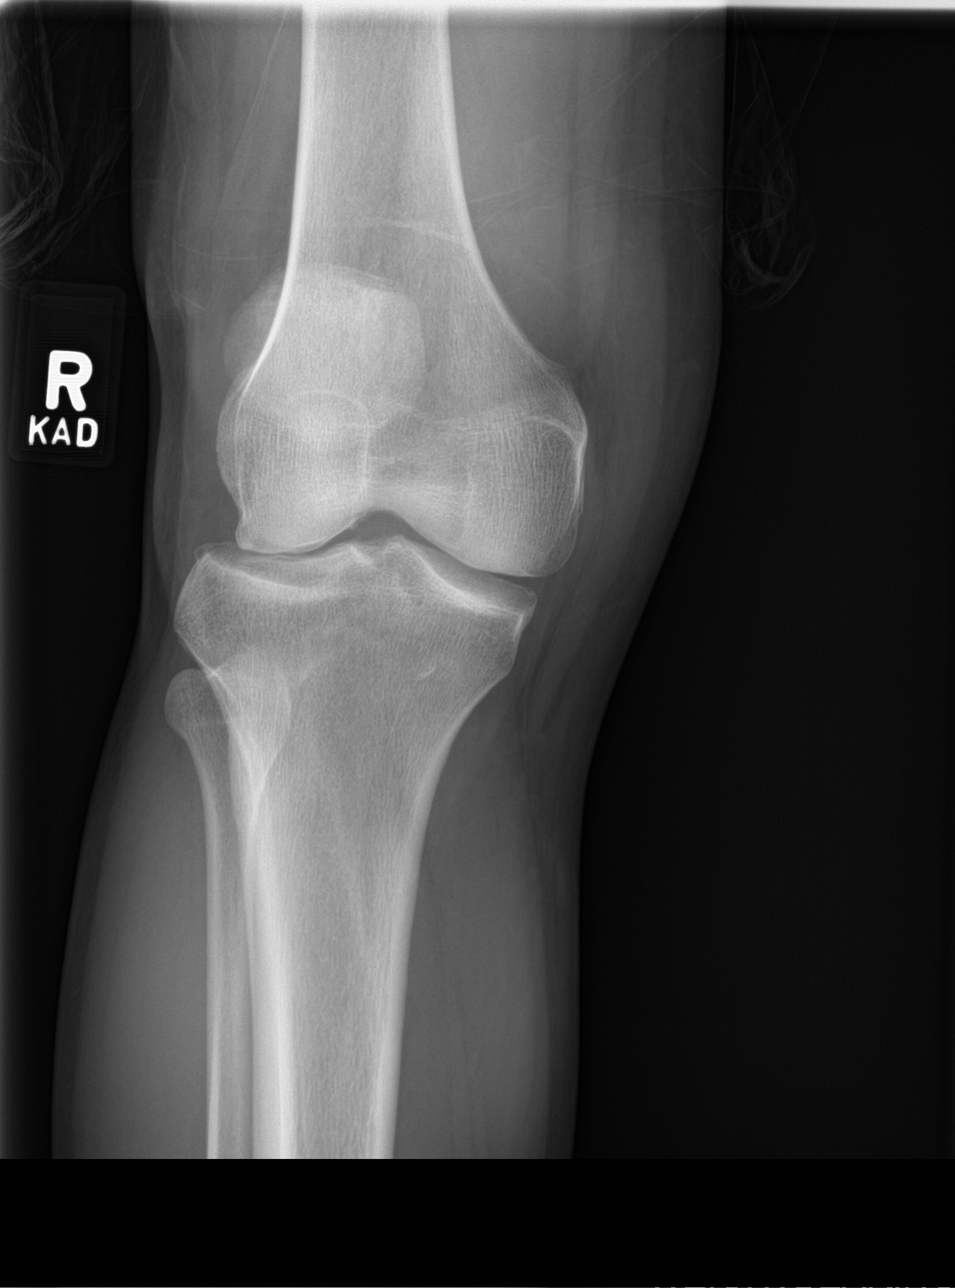

[knee tunnel]
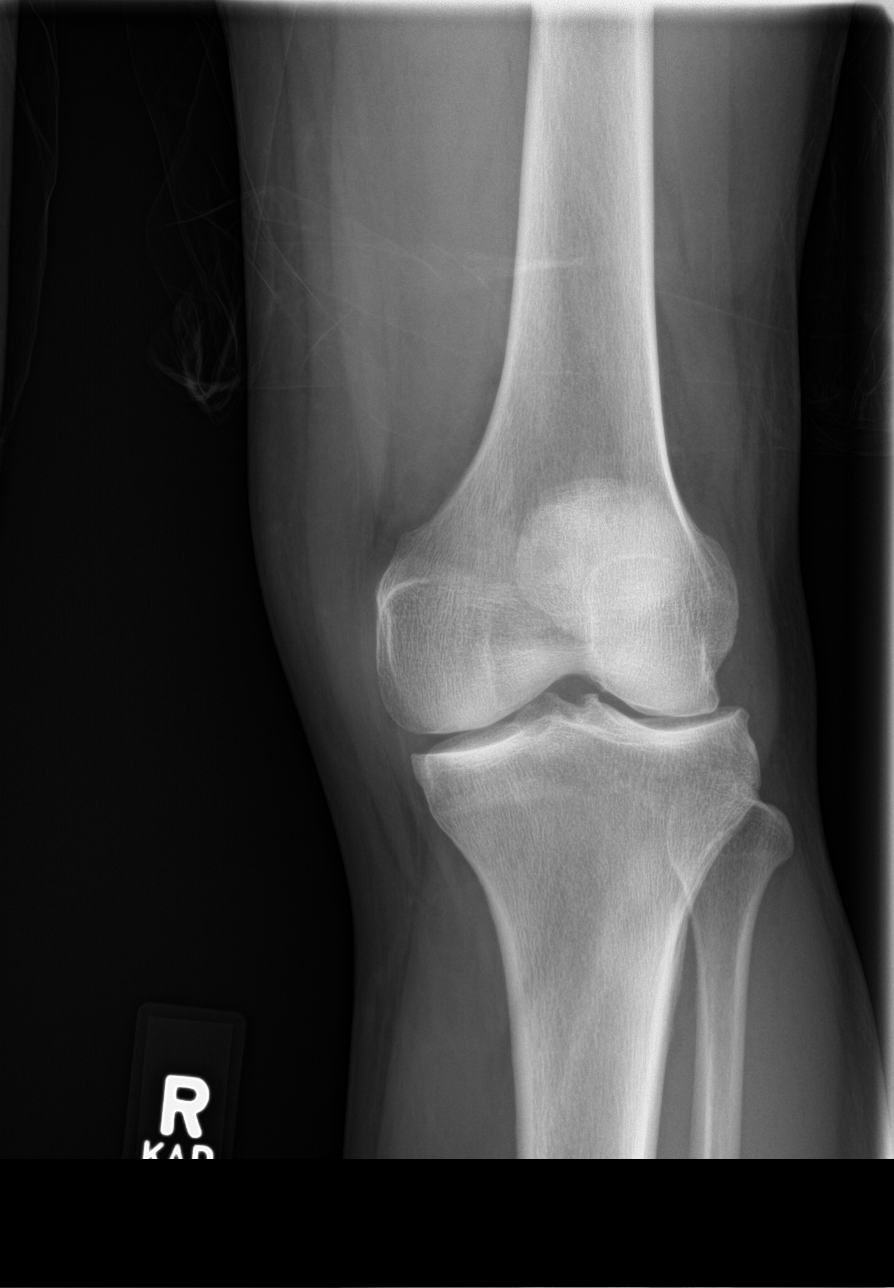

[knee lat]
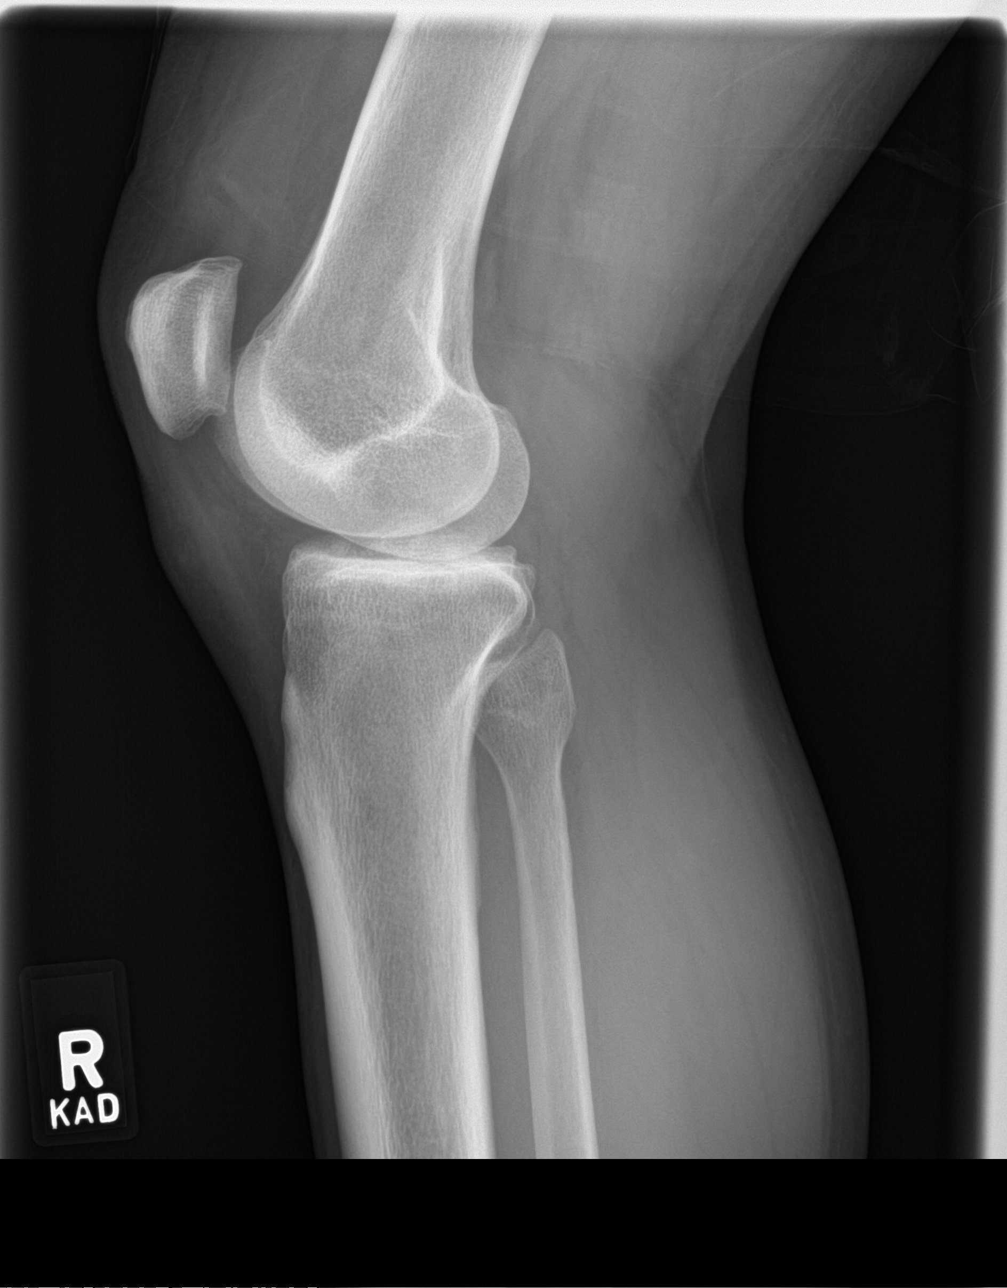

[patella skyline]
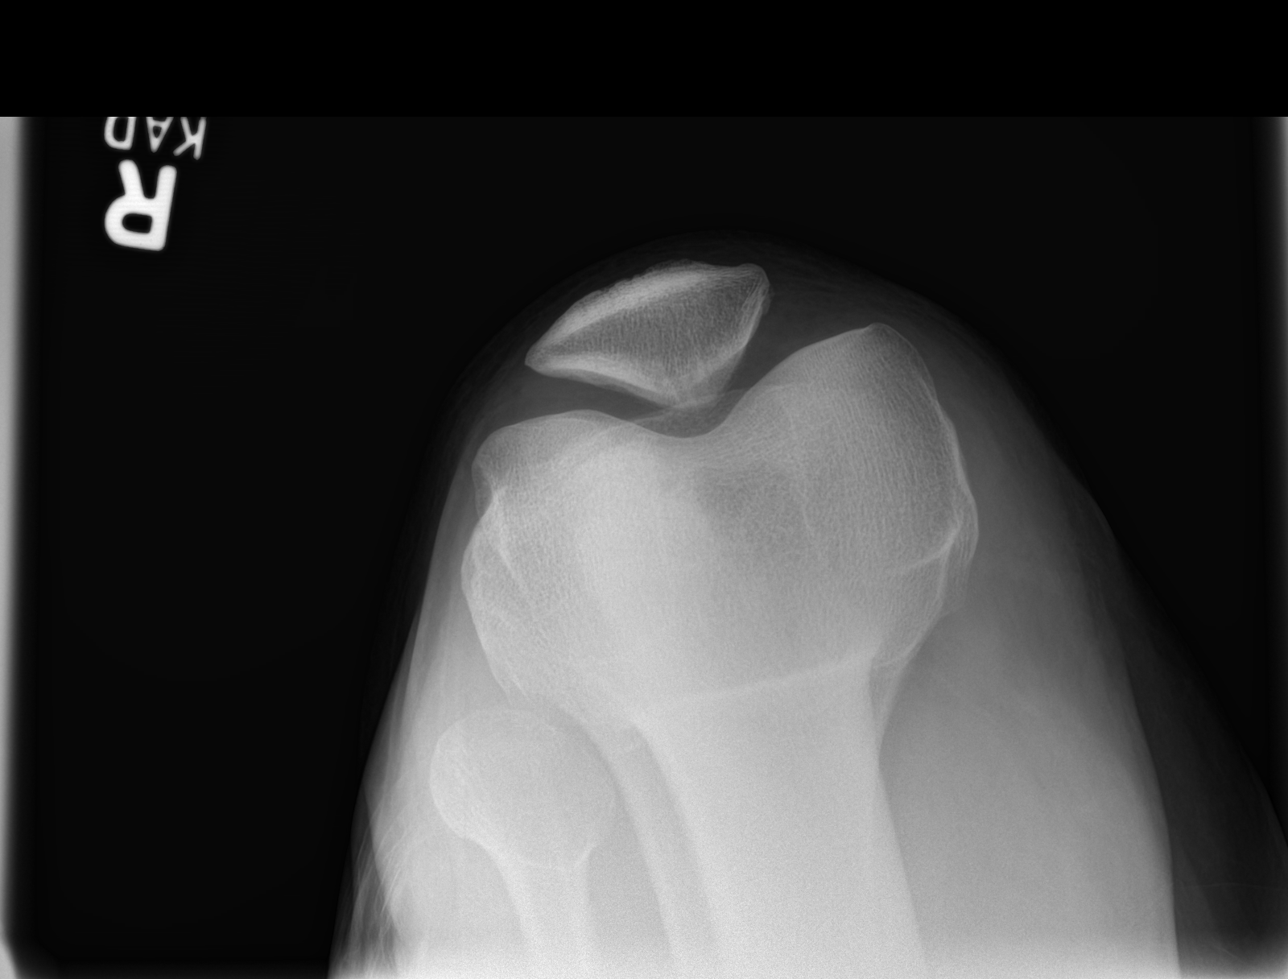

[4 of 4 positions shown; findings below may reference images not displayed]

FINDINGS: Standing frontal, standing tunnel, standing lateral, and sunrise
patellar images were obtained. There is no fracture or dislocation.
There is no appreciable joint effusion. There is mild joint space
narrowing laterally. Other joint spaces appear normal. No erosive
change.
IMPRESSION: Mild joint space narrowing laterally. No fracture or joint effusion.

## 2019-03-10 ENCOUNTER — Other Ambulatory Visit: Payer: Self-pay | Admitting: Family Medicine

## 2019-03-14 ENCOUNTER — Telehealth: Payer: Self-pay

## 2019-03-14 NOTE — Telephone Encounter (Signed)
Submitted PA for diclofenac 1% gel; key:  [id number].  Decision pending.

## 2019-03-14 NOTE — Telephone Encounter (Signed)
Received faxed PA approval, valid 03/14/2019- 03/13/2022.  [facility] is aware and preparing rx for pt.

## 2019-03-17 ENCOUNTER — Ambulatory Visit: Payer: No Typology Code available for payment source | Admitting: Family Medicine

## 2019-03-17 ENCOUNTER — Other Ambulatory Visit: Payer: Self-pay

## 2019-03-17 ENCOUNTER — Encounter: Payer: Self-pay | Admitting: Family Medicine

## 2019-03-17 ENCOUNTER — Ambulatory Visit (INDEPENDENT_AMBULATORY_CARE_PROVIDER_SITE_OTHER): Payer: Managed Care, Other (non HMO) | Admitting: Family Medicine

## 2019-03-17 VITALS — BP 116/80 | HR 83 | Temp 97.9°F | Ht 69.0 in | Wt 189.0 lb

## 2019-03-17 DIAGNOSIS — R21 Rash and other nonspecific skin eruption: Secondary | ICD-10-CM | POA: Diagnosis not present

## 2019-03-17 DIAGNOSIS — E118 Type 2 diabetes mellitus with unspecified complications: Secondary | ICD-10-CM | POA: Diagnosis not present

## 2019-03-17 DIAGNOSIS — L282 Other prurigo: Secondary | ICD-10-CM | POA: Insufficient documentation

## 2019-03-17 LAB — POCT GLYCOSYLATED HEMOGLOBIN (HGB A1C): Hemoglobin A1C: 6.6 % — AB (ref 4.0–5.6)

## 2019-03-17 MED ORDER — TRIAMCINOLONE ACETONIDE 0.1 % EX CREA: 1.0000 "application " | TOPICAL_CREAM | Freq: Two times a day (BID) | CUTANEOUS | 0 refills | Status: DC

## 2019-03-17 NOTE — Assessment & Plan Note (Signed)
Chronic, marked improvement with metformin and addition of glimepiride 2mg  daily with breakfast - continue this. One low sugar - discussed 15-15 rule.

## 2019-03-17 NOTE — Patient Instructions (Addendum)
You are doing well today Return as needed or in 6 months for physical.  The 15-15 rule: Have 15 grams of carbohydrate to raise your blood sugar and check it after 15 minutes. If it's still below 70 mg/dL, have another serving. Repeat these steps until your blood sugar is at least 70 mg/dL. Once your blood sugar is back to normal, eat a meal or snack to make sure it doesn't lower again.  This may be: -Glucose tablets (see instructions) -Gel tube (see instructions) -4 ounces (1/2 cup) of juice or regular soda (not diet) -1 tablespoon of sugar, honey, or corn syrup -Hard candies, jellybeans or gumdrops--see food label for how many to consume

## 2019-03-17 NOTE — Assessment & Plan Note (Signed)
Itchy skin rash to bilateral anterior lower extremities. Anticipate xerosis. rec trial lotrimin x 2wks and if no improvement, then may try triamcinolone cream 2 wks at a time + regular moisturizing.

## 2019-03-17 NOTE — Progress Notes (Signed)
This visit was conducted in person.  BP 116/80 (BP Location: Left Arm, Patient Position: Sitting, Cuff Size: Large)   Pulse 83   Temp 97.9 F (36.6 C) (Temporal)   Ht '5\' 9"'  (1.753 m)   Wt 189 lb (85.7 kg)   SpO2 96%   BMI 27.91 kg/m    CC: DM f/u visit Subjective:    Patient ID: Gary Long, male    DOB: Jul 17, 1970, 49 y.o.   MRN: [id number]  HPI: Gary Long is a 49 y.o. male presenting on 03/17/2019 for Diabetes (Here for 3 mo f/u.) and Pruritis (C/o itchy area on right shin.  Started about 2 mos ago.  Tried Ecerin cream and hydrocotisone cream, barely helpful.  )   Intermittent leg itching manages with OTC creams.   DM - does regularly check sugars twice daily - fasting 120-130, PM 80-90. Compliant with antihyperglycemic regimen which includes: metformin 518m bid, glimepiride 264mdaily. Denies low sugars or hypoglycemic symptoms. Denies paresthesias. Last diabetic eye exam DUE. Pneumovax: 2014. Prevnar: not due. Glucometer brand: accu-chek. DSME: incomplete. Walking regularly for exercise.  Lab Results  Component Value Date   HGBA1C 6.6 (A) 03/17/2019   Diabetic Foot Exam - Simple   No data filed     Lab Results  Component Value Date   MICROALBUR <0.7 11/29/2018       Relevant past medical, surgical, family and social history reviewed and updated as indicated. Interim medical history since our last visit reviewed. Allergies and medications reviewed and updated. Outpatient Medications Prior to Visit  Medication Sig Dispense Refill  . Accu-Chek FastClix Lancets MISC USE AS DIRECTED TO CHECK BLOOD SUGAR ONCE DAILY 100 each 0  . ACCU-CHEK GUIDE test strip . USE AS INSTRUCTED TO CHECK BLOOD SUGAR ONCE DAILY 100 strip 1  . atorvastatin (LIPITOR) 40 MG tablet Take 1 tablet (40 mg total) by mouth daily. 90 tablet 3  . Blood Glucose Monitoring Suppl (ACCU-CHEK GUIDE) w/Device KIT 1 each by Does not apply route as needed. Use as directed to check blood sugar  once daily 1 kit 0  . diclofenac Sodium (VOLTAREN) 1 % GEL APPLY 2 G TOPICALLY 3 (THREE) TIMES DAILY. (Patient taking differently: As needed) 100 g 3  . glimepiride (AMARYL) 2 MG tablet Take 1 tablet (2 mg total) by mouth daily before breakfast. 90 tablet 3  . metFORMIN (GLUCOPHAGE) 500 MG tablet Take 1 tablet (500 mg total) by mouth 2 (two) times daily with a meal. 180 tablet 3  . naproxen (NAPROSYN) 500 MG tablet TAKE 1 TABLET BY MOUTH TWICE A DAY X1 WEEK, THEN AS NEEDED FOR PAIN . TAKE WITH FOOD 40 tablet 0   No facility-administered medications prior to visit.     Per HPI unless specifically indicated in ROS section below Review of Systems Objective:    BP 116/80 (BP Location: Left Arm, Patient Position: Sitting, Cuff Size: Large)   Pulse 83   Temp 97.9 F (36.6 C) (Temporal)   Ht '5\' 9"'  (1.753 m)   Wt 189 lb (85.7 kg)   SpO2 96%   BMI 27.91 kg/m   Wt Readings from Last 3 Encounters:  03/17/19 189 lb (85.7 kg)  11/29/18 179 lb 4 oz (81.3 kg)  08/22/18 187 lb 12 oz (85.2 kg)    Physical Exam Vitals and nursing note reviewed.  Constitutional:      Appearance: Normal appearance. He is well-developed. He is not ill-appearing.  Eyes:     General:  No scleral icterus.    Extraocular Movements: Extraocular movements intact.     Conjunctiva/sclera: Conjunctivae normal.     Pupils: Pupils are equal, round, and reactive to light.  Cardiovascular:     Rate and Rhythm: Normal rate and regular rhythm.     Pulses: Normal pulses.     Heart sounds: Normal heart sounds. No murmur.  Pulmonary:     Effort: Pulmonary effort is normal. No respiratory distress.     Breath sounds: Normal breath sounds. No wheezing, rhonchi or rales.  Musculoskeletal:     Right lower leg: No edema.     Left lower leg: No edema.     Comments: See HPI for foot exam if done  Skin:    General: Skin is warm and dry.     Findings: Rash present.     Comments: Itchy patches BLE of skin  Neurological:     Mental  Status: He is alert.  Psychiatric:        Mood and Affect: Mood normal.        Behavior: Behavior normal.       Results for orders placed or performed in visit on 03/17/19  POCT glycosylated hemoglobin (Hb A1C)  Result Value Ref Range   Hemoglobin A1C 6.6 (A) 4.0 - 5.6 %   HbA1c POC (<> result, manual entry)     HbA1c, POC (prediabetic range)     HbA1c, POC (controlled diabetic range)     Assessment & Plan:  This visit occurred during the SARS-CoV-2 public health emergency.  Safety protocols were in place, including screening questions prior to the visit, additional usage of staff PPE, and extensive cleaning of exam room while observing appropriate contact time as indicated for disinfecting solutions.   Problem List Items Addressed This Visit    Skin rash    Itchy skin rash to bilateral anterior lower extremities. Anticipate xerosis. rec trial lotrimin x 2wks and if no improvement, then may try triamcinolone cream 2 wks at a time + regular moisturizing.       Controlled diabetes mellitus type 2 with complications (HCC) - Primary    Chronic, marked improvement with metformin and addition of glimepiride 59m daily with breakfast - continue this. One low sugar - discussed 15-15 rule.       Relevant Orders   POCT glycosylated hemoglobin (Hb A1C) (Completed)       Meds ordered this encounter  Medications  . triamcinolone cream (KENALOG) 0.1 %    Sig: Apply 1 application topically 2 (two) times daily. Apply to AA for max 2 weeks at a time    Dispense:  80 g    Refill:  0   Orders Placed This Encounter  Procedures  . POCT glycosylated hemoglobin (Hb A1C)    Follow up plan: Return in about 6 months (around 09/17/2019), or if symptoms worsen or fail to improve, for annual exam, prior fasting for blood work.  JRia Bush MD

## 2019-04-21 ENCOUNTER — Encounter: Payer: Self-pay | Admitting: Family Medicine

## 2019-04-21 ENCOUNTER — Other Ambulatory Visit: Payer: Self-pay

## 2019-04-21 ENCOUNTER — Ambulatory Visit (INDEPENDENT_AMBULATORY_CARE_PROVIDER_SITE_OTHER): Payer: Managed Care, Other (non HMO) | Admitting: Family Medicine

## 2019-04-21 VITALS — BP 116/68 | HR 70 | Temp 97.9°F | Ht 69.0 in | Wt 193.0 lb

## 2019-04-21 DIAGNOSIS — E118 Type 2 diabetes mellitus with unspecified complications: Secondary | ICD-10-CM | POA: Diagnosis not present

## 2019-04-21 DIAGNOSIS — L282 Other prurigo: Secondary | ICD-10-CM | POA: Diagnosis not present

## 2019-04-21 NOTE — Assessment & Plan Note (Addendum)
Itch that rashes.  Only new med has been glimepiride (started 11/020). Reviewing side effects, pruritis is a possible side effect to sulfonylureas.  rec trial off this for 2-3 wks to note effect on rash. In interim, start claritin, continue TCI cream to control itch.

## 2019-04-21 NOTE — Progress Notes (Signed)
This visit was conducted in person.  BP 116/68 (BP Location: Left Arm, Patient Position: Sitting, Cuff Size: Normal)   Pulse 70   Temp 97.9 F (36.6 C) (Temporal)   Ht '5\' 9"'  (1.753 m)   Wt 193 lb (87.5 kg)   SpO2 96%   BMI 28.50 kg/m    CC: pruritis Subjective:    Patient ID: Gary Long, male    DOB: 1970-04-01, 49 y.o.   MRN: [id number]  HPI: Gary Long is a 49 y.o. male presenting on 04/21/2019 for Pruritis (C/o itching allover, worse on back.  Started about 2 mos ago.  Seen and treated. )   2 mo h/o body itching previously managed with OTC creams. Predominantly to R lower back/buttock as well as L lateral thigh.  Previously tried lotrimin x 2 wks and if no improvement, rec TCI cream + regular moisturizing. Has tried eucerin, cortizone.   No new medicines.  No new lotions, detergents, soaps or shampoos.  We started glimepiride 39m late last year.   cbg's running good - max 151. No lows.   Stress levels manageable.      Relevant past medical, surgical, family and social history reviewed and updated as indicated. Interim medical history since our last visit reviewed. Allergies and medications reviewed and updated. Outpatient Medications Prior to Visit  Medication Sig Dispense Refill  . Accu-Chek FastClix Lancets MISC USE AS DIRECTED TO CHECK BLOOD SUGAR ONCE DAILY 100 each 0  . ACCU-CHEK GUIDE test strip . USE AS INSTRUCTED TO CHECK BLOOD SUGAR ONCE DAILY 100 strip 1  . atorvastatin (LIPITOR) 40 MG tablet Take 1 tablet (40 mg total) by mouth daily. 90 tablet 3  . Blood Glucose Monitoring Suppl (ACCU-CHEK GUIDE) w/Device KIT 1 each by Does not apply route as needed. Use as directed to check blood sugar once daily 1 kit 0  . glimepiride (AMARYL) 2 MG tablet Take 1 tablet (2 mg total) by mouth daily before breakfast. 90 tablet 3  . metFORMIN (GLUCOPHAGE) 500 MG tablet Take 1 tablet (500 mg total) by mouth 2 (two) times daily with a meal. 180 tablet 3  .  triamcinolone cream (KENALOG) 0.1 % Apply 1 application topically 2 (two) times daily. Apply to AA for max 2 weeks at a time 80 g 0  . diclofenac Sodium (VOLTAREN) 1 % GEL APPLY 2 G TOPICALLY 3 (THREE) TIMES DAILY. (Patient taking differently: As needed) 100 g 3   No facility-administered medications prior to visit.     Per HPI unless specifically indicated in ROS section below Review of Systems Objective:    BP 116/68 (BP Location: Left Arm, Patient Position: Sitting, Cuff Size: Normal)   Pulse 70   Temp 97.9 F (36.6 C) (Temporal)   Ht '5\' 9"'  (1.753 m)   Wt 193 lb (87.5 kg)   SpO2 96%   BMI 28.50 kg/m   Wt Readings from Last 3 Encounters:  04/21/19 193 lb (87.5 kg)  03/17/19 189 lb (85.7 kg)  11/29/18 179 lb 4 oz (81.3 kg)    Physical Exam Vitals and nursing note reviewed.  Constitutional:      Appearance: Normal appearance. He is not ill-appearing.  Musculoskeletal:     Right lower leg: No edema.     Left lower leg: No edema.  Skin:    General: Skin is warm and dry.     Findings: Rash present. No erythema.          Comments:  Lichenified  skin to pruritic areas on R buttock and L lateral thigh with evidence of excoriations Anterior legs have largely healed.   Neurological:     Mental Status: He is alert.  Psychiatric:        Mood and Affect: Mood normal.        Behavior: Behavior normal.       Results for orders placed or performed in visit on 03/17/19  POCT glycosylated hemoglobin (Hb A1C)  Result Value Ref Range   Hemoglobin A1C 6.6 (A) 4.0 - 5.6 %   HbA1c POC (<> result, manual entry)     HbA1c, POC (prediabetic range)     HbA1c, POC (controlled diabetic range)     Assessment & Plan:  This visit occurred during the SARS-CoV-2 public health emergency.  Safety protocols were in place, including screening questions prior to the visit, additional usage of staff PPE, and extensive cleaning of exam room while observing appropriate contact time as indicated for  disinfecting solutions.   Problem List Items Addressed This Visit    Pruritic rash    Itch that rashes.  Only new med has been glimepiride (started 11/020). Reviewing side effects, pruritis is a possible side effect to sulfonylureas.  rec trial off this for 2-3 wks to note effect on rash. In interim, start claritin, continue TCI cream to control itch.       Controlled diabetes mellitus type 2 with complications (HCC)    Trial off amaryl to see if improvement in rash.  Update with effect and glycemic control, consider restarting jardiance vs other medication.           No orders of the defined types were placed in this encounter.  No orders of the defined types were placed in this encounter.   Patient Instructions  Trial off glimepiride for 2-3 weeks - see if any improvement in itch that rashes.  Start claritin or allegra daily for itching. Continue triamcinolone cream for itchy spots or for thicker parts of skin.  Continue moisturizing regularly.    Follow up plan: Return if symptoms worsen or fail to improve.  Ria Bush, MD

## 2019-04-21 NOTE — Patient Instructions (Addendum)
Trial off glimepiride for 2-3 weeks - see if any improvement in itch that rashes.  Start claritin or allegra daily for itching. Continue triamcinolone cream for itchy spots or for thicker parts of skin.  Continue moisturizing regularly.

## 2019-04-21 NOTE — Assessment & Plan Note (Signed)
Trial off amaryl to see if improvement in rash.  Update with effect and glycemic control, consider restarting jardiance vs other medication.

## 2019-05-07 ENCOUNTER — Other Ambulatory Visit: Payer: Self-pay | Admitting: Family Medicine

## 2019-05-14 ENCOUNTER — Encounter: Payer: Self-pay | Admitting: Family Medicine

## 2019-05-14 ENCOUNTER — Other Ambulatory Visit: Payer: Self-pay

## 2019-05-14 ENCOUNTER — Ambulatory Visit (INDEPENDENT_AMBULATORY_CARE_PROVIDER_SITE_OTHER): Payer: Self-pay | Admitting: Family Medicine

## 2019-05-14 DIAGNOSIS — L282 Other prurigo: Secondary | ICD-10-CM

## 2019-05-14 MED ORDER — PREDNISONE 10 MG PO TABS: ORAL_TABLET | ORAL | 0 refills | Status: DC

## 2019-05-14 MED ORDER — HYDROXYZINE HCL 25 MG PO TABS: 12.5000 mg | ORAL_TABLET | Freq: Three times a day (TID) | ORAL | 0 refills | Status: DC | PRN

## 2019-05-14 NOTE — Progress Notes (Signed)
This visit was conducted in person.  BP 122/84 (BP Location: Left Arm, Patient Position: Sitting, Cuff Size: Normal)   Pulse 76   Temp 97.9 F (36.6 C) (Temporal)   Ht 5' 9" (1.753 m)   Wt 189 lb 4 oz (85.8 kg)   SpO2 97%   BMI 27.95 kg/m    CC: worsening rash  Subjective:    Patient ID: Gary Long, male    DOB: [birth year], 49 y.o.   MRN: [id number]  HPI: Gary Long is a 49 y.o. male presenting on 05/14/2019 for Follow-up (C/o rash is worsening.  Wants to discuss other options. )   See prior note for details.  Itchy rash to R lower back/buttock and L lateral thigh - at last month eval thought rash to sulfonylurea (amaryl - started 11/2018) - he stopped this for the past 3 weeks without much improvement. Also treated with daily antihistamine and TCI cream. He continues moisturizing regularly.    Itchiness has spread to left chest   Fasting cbg's elevated off amaryl - up to 167 yesterday, this morning 151. Normally 120-130.  Lab Results  Component Value Date   HGBA1C 6.6 (A) 03/17/2019     No new lotions, detergents, foods.  Ongoing for the past 3 months. This may have started after trip to [place] where he stayed with sister.      Relevant past medical, surgical, family and social history reviewed and updated as indicated. Interim medical history since our last visit reviewed. Allergies and medications reviewed and updated. Outpatient Medications Prior to Visit  Medication Sig Dispense Refill  . Accu-Chek FastClix Lancets MISC USE AS DIRECTED TO CHECK BLOOD SUGAR ONCE DAILY 100 each 0  . ACCU-CHEK GUIDE test strip . USE AS INSTRUCTED TO CHECK BLOOD SUGAR ONCE DAILY 50 strip 3  . atorvastatin (LIPITOR) 40 MG tablet Take 1 tablet (40 mg total) by mouth daily. 90 tablet 3  . Blood Glucose Monitoring Suppl (ACCU-CHEK GUIDE) w/Device KIT 1 each by Does not apply route as needed. Use as directed to check blood sugar once daily 1 kit 0  . metFORMIN (GLUCOPHAGE)  500 MG tablet Take 1 tablet (500 mg total) by mouth 2 (two) times daily with a meal. 180 tablet 3  . triamcinolone cream (KENALOG) 0.1 % Apply 1 application topically 2 (two) times daily. Apply to AA for max 2 weeks at a time 80 g 0  . glimepiride (AMARYL) 2 MG tablet Take 1 tablet (2 mg total) by mouth daily before breakfast. (Patient not taking: Reported on 05/14/2019) 90 tablet 3   No facility-administered medications prior to visit.     Per HPI unless specifically indicated in ROS section below Review of Systems Objective:  BP 122/84 (BP Location: Left Arm, Patient Position: Sitting, Cuff Size: Normal)   Pulse 76   Temp 97.9 F (36.6 C) (Temporal)   Ht 5' 9" (1.753 m)   Wt 189 lb 4 oz (85.8 kg)   SpO2 97%   BMI 27.95 kg/m   Wt Readings from Last 3 Encounters:  05/14/19 189 lb 4 oz (85.8 kg)  04/21/19 193 lb (87.5 kg)  03/17/19 189 lb (85.7 kg)      Physical Exam Vitals and nursing note reviewed.  Constitutional:      Appearance: Normal appearance. He is not ill-appearing.  Skin:    General: Skin is warm and dry.     Findings: Rash present. No erythema.     Comments: Rough skin  at itchy spots - with skin thickening from scratching, no obvious abrasion or papular rash otherwise  Neurological:     Mental Status: He is alert.  Psychiatric:        Mood and Affect: Mood normal.        Behavior: Behavior normal.       Results for orders placed or performed in visit on 03/17/19  POCT glycosylated hemoglobin (Hb A1C)  Result Value Ref Range   Hemoglobin A1C 6.6 (A) 4.0 - 5.6 %   HbA1c POC (<> result, manual entry)     HbA1c, POC (prediabetic range)     HbA1c, POC (controlled diabetic range)     Assessment & Plan:  This visit occurred during the SARS-CoV-2 public health emergency.  Safety protocols were in place, including screening questions prior to the visit, additional usage of staff PPE, and extensive cleaning of exam room while observing appropriate contact time as  indicated for disinfecting solutions.   Problem List Items Addressed This Visit    Pruritic rash    Itch that rashes. Ongoing despite sulfonylurea hold x 3 wks. Will restart amaryl to improve glycemic control. Will Rx prednisone 70m taper over 9 days. Will Rx hydroxyzine PRN itch. If no better with this, will refer to dermatology.  Reviewed routine skin care precautions.           Meds ordered this encounter  Medications  . predniSONE (DELTASONE) 10 MG tablet    Sig: Take three tablets for 3 days followed by two tablets for 3 days followed by one tablet for 3 days    Dispense:  18 tablet    Refill:  0  . hydrOXYzine (ATARAX/VISTARIL) 25 MG tablet    Sig: Take 0.5-1 tablets (12.5-25 mg total) by mouth 3 (three) times daily as needed for itching (sedation precautions).    Dispense:  30 tablet    Refill:  0   No orders of the defined types were placed in this encounter.   Patient Instructions  Restart glimepiride - likely not causing itch.  Prednisone taper for 9 days to help itch.  Take hydroxyzine 1/2-1 tablet as needed for itch - with sedation precautions.  Continue regular moisturizing, use luke warm water to shower.  If no better with above, let me know for dermatology referral.    Follow up plan: Return if symptoms worsen or fail to improve.  JRia Bush MD

## 2019-05-14 NOTE — Assessment & Plan Note (Signed)
Itch that rashes. Ongoing despite sulfonylurea hold x 3 wks. Will restart amaryl to improve glycemic control. Will Rx prednisone 10mg  taper over 9 days. Will Rx hydroxyzine PRN itch. If no better with this, will refer to dermatology.  Reviewed routine skin care precautions.

## 2019-05-14 NOTE — Patient Instructions (Addendum)
Restart glimepiride - likely not causing itch.  Prednisone taper for 9 days to help itch.  Take hydroxyzine 1/2-1 tablet as needed for itch - with sedation precautions.  Continue regular moisturizing, use luke warm water to shower.  If no better with above, let me know for dermatology referral.

## 2019-06-15 ENCOUNTER — Other Ambulatory Visit: Payer: Self-pay | Admitting: Family Medicine

## 2019-06-16 NOTE — Telephone Encounter (Signed)
Last office visit 05/14/2019 for Pruritic rash.  Last refilled Triamcinolone cream 03/17/2019 for 80 g with no refills.  Hydroxyzine 05/14/2019 for #30 with no refills.  CPE scheduled for 09/23/2019.

## 2019-07-02 ENCOUNTER — Encounter: Payer: Self-pay | Admitting: Family Medicine

## 2019-07-04 MED ORDER — ATOVAQUONE-PROGUANIL HCL 62.5-25 MG PO TABS: 1.0000 | ORAL_TABLET | Freq: Every day | ORAL | 0 refills | Status: DC

## 2019-07-04 MED ORDER — TYPHOID VACCINE PO CPDR: 1.0000 | DELAYED_RELEASE_CAPSULE | ORAL | 0 refills | Status: DC

## 2019-07-10 ENCOUNTER — Ambulatory Visit: Payer: BLUE CROSS/BLUE SHIELD | Attending: Internal Medicine

## 2019-07-10 ENCOUNTER — Telehealth: Payer: Self-pay | Admitting: Family Medicine

## 2019-07-10 DIAGNOSIS — Z20822 Contact with and (suspected) exposure to covid-19: Secondary | ICD-10-CM

## 2019-07-10 NOTE — Telephone Encounter (Signed)
Correction-Vivotif.

## 2019-07-10 NOTE — Telephone Encounter (Signed)
Patient called.  Patient was prescribed Vivofil.  The pharmacy told him the medication has been discontinued.  Patient's going on vacation on Monday.  Patient would like a different rx sent to [facility].

## 2019-07-11 ENCOUNTER — Ambulatory Visit: Payer: BLUE CROSS/BLUE SHIELD | Attending: Internal Medicine

## 2019-07-11 DIAGNOSIS — Z20822 Contact with and (suspected) exposure to covid-19: Secondary | ICD-10-CM | POA: Insufficient documentation

## 2019-07-11 LAB — NOVEL CORONAVIRUS, NAA: SARS-CoV-2, NAA: NOT DETECTED

## 2019-07-11 LAB — SARS-COV-2, NAA 2 DAY TAT

## 2019-07-11 NOTE — Telephone Encounter (Signed)
Patient is returning your call.  

## 2019-07-11 NOTE — Telephone Encounter (Signed)
Spoke with pt relaying Dr. G's message. Pt verbalizes understanding.  

## 2019-07-11 NOTE — Telephone Encounter (Signed)
Apparently vivotif has been discontinued in the [place] due to low travel/low demand due to pandemic.  Other option is to call health department and see if they have typhim typhoid vaccine by injection available and see if they can do it today (should have gotten 2 weeks prior to travel as the malaria medicine can interact with it).

## 2019-07-11 NOTE — Telephone Encounter (Signed)
Lvm asking pt to call back.  Need to relay Dr. G's message.  

## 2019-07-12 LAB — NOVEL CORONAVIRUS, NAA: SARS-CoV-2, NAA: NOT DETECTED

## 2019-07-12 LAB — SARS-COV-2, NAA 2 DAY TAT

## 2019-09-07 ENCOUNTER — Other Ambulatory Visit: Payer: Self-pay | Admitting: Family Medicine

## 2019-09-14 ENCOUNTER — Other Ambulatory Visit: Payer: Self-pay | Admitting: Family Medicine

## 2019-09-18 ENCOUNTER — Other Ambulatory Visit: Payer: Self-pay | Admitting: Family Medicine

## 2019-09-18 DIAGNOSIS — D696 Thrombocytopenia, unspecified: Secondary | ICD-10-CM

## 2019-09-18 DIAGNOSIS — E1169 Type 2 diabetes mellitus with other specified complication: Secondary | ICD-10-CM

## 2019-09-18 DIAGNOSIS — E118 Type 2 diabetes mellitus with unspecified complications: Secondary | ICD-10-CM

## 2019-09-19 ENCOUNTER — Other Ambulatory Visit: Payer: Self-pay | Admitting: Family Medicine

## 2019-09-19 ENCOUNTER — Other Ambulatory Visit: Payer: Managed Care, Other (non HMO)

## 2019-09-22 ENCOUNTER — Other Ambulatory Visit (INDEPENDENT_AMBULATORY_CARE_PROVIDER_SITE_OTHER): Payer: Self-pay

## 2019-09-22 ENCOUNTER — Other Ambulatory Visit: Payer: Self-pay

## 2019-09-22 DIAGNOSIS — E118 Type 2 diabetes mellitus with unspecified complications: Secondary | ICD-10-CM

## 2019-09-22 DIAGNOSIS — E785 Hyperlipidemia, unspecified: Secondary | ICD-10-CM

## 2019-09-22 DIAGNOSIS — D696 Thrombocytopenia, unspecified: Secondary | ICD-10-CM

## 2019-09-22 DIAGNOSIS — E1169 Type 2 diabetes mellitus with other specified complication: Secondary | ICD-10-CM

## 2019-09-22 LAB — COMPREHENSIVE METABOLIC PANEL
ALT: 36 U/L (ref 0–53)
AST: 19 U/L (ref 0–37)
Albumin: 4.1 g/dL (ref 3.5–5.2)
Alkaline Phosphatase: 77 U/L (ref 39–117)
BUN: 14 mg/dL (ref 6–23)
CO2: 28 mEq/L (ref 19–32)
Calcium: 9.2 mg/dL (ref 8.4–10.5)
Chloride: 104 mEq/L (ref 96–112)
Creatinine, Ser: 1.16 mg/dL (ref 0.40–1.50)
GFR: 80.92 mL/min (ref >60.00)
Glucose, Bld: 149 mg/dL — ABNORMAL HIGH (ref 70–99)
Potassium: 4.2 mEq/L (ref 3.5–5.1)
Sodium: 138 mEq/L (ref 135–145)
Total Bilirubin: 0.4 mg/dL (ref 0.2–1.2)
Total Protein: 6.9 g/dL (ref 6.0–8.3)

## 2019-09-22 LAB — LIPID PANEL
Cholesterol: 167 mg/dL (ref 0–200)
HDL: 54.5 mg/dL (ref >39.00)
LDL Cholesterol: 97 mg/dL (ref 0–99)
NonHDL: 112.91
Total CHOL/HDL Ratio: 3
Triglycerides: 79 mg/dL (ref 0.0–149.0)
VLDL: 15.8 mg/dL (ref 0.0–40.0)

## 2019-09-22 LAB — MICROALBUMIN / CREATININE URINE RATIO
Creatinine,U: 119.3 mg/dL
Microalb Creat Ratio: 0.6 mg/g (ref 0.0–30.0)
Microalb, Ur: <0.7 mg/dL (ref 0.0–1.9)

## 2019-09-22 LAB — CBC WITH DIFFERENTIAL/PLATELET
Basophils Absolute: 0 10*3/uL (ref 0.0–0.1)
Basophils Relative: 0.3 % (ref 0.0–3.0)
Eosinophils Absolute: 0.1 10*3/uL (ref 0.0–0.7)
Eosinophils Relative: 1.7 % (ref 0.0–5.0)
HCT: 41.4 % (ref 39.0–52.0)
Hemoglobin: 13.5 g/dL (ref 13.0–17.0)
Lymphocytes Relative: 47.9 % — ABNORMAL HIGH (ref 12.0–46.0)
Lymphs Abs: 2.4 10*3/uL (ref 0.7–4.0)
MCHC: 32.7 g/dL (ref 30.0–36.0)
MCV: 86.4 fl (ref 78.0–100.0)
Monocytes Absolute: 0.6 10*3/uL (ref 0.1–1.0)
Monocytes Relative: 11.9 % (ref 3.0–12.0)
Neutro Abs: 1.9 10*3/uL (ref 1.4–7.7)
Neutrophils Relative %: 38.2 % — ABNORMAL LOW (ref 43.0–77.0)
Platelets: 136 10*3/uL — ABNORMAL LOW (ref 150.0–400.0)
RBC: 4.79 Mil/uL (ref 4.22–5.81)
RDW: 14.2 % (ref 11.5–15.5)
WBC: 5 10*3/uL (ref 4.0–10.5)

## 2019-09-22 LAB — HEMOGLOBIN A1C: Hgb A1c MFr Bld: 7 % — ABNORMAL HIGH (ref 4.6–6.5)

## 2019-09-23 ENCOUNTER — Encounter: Payer: Managed Care, Other (non HMO) | Admitting: Family Medicine

## 2019-09-23 ENCOUNTER — Encounter: Payer: Self-pay | Admitting: Family Medicine

## 2019-09-23 ENCOUNTER — Ambulatory Visit (INDEPENDENT_AMBULATORY_CARE_PROVIDER_SITE_OTHER): Payer: BLUE CROSS/BLUE SHIELD | Admitting: Family Medicine

## 2019-09-23 VITALS — BP 118/80 | HR 84 | Temp 98.4°F | Ht 69.5 in | Wt 189.3 lb

## 2019-09-23 DIAGNOSIS — Z23 Encounter for immunization: Secondary | ICD-10-CM

## 2019-09-23 DIAGNOSIS — K219 Gastro-esophageal reflux disease without esophagitis: Secondary | ICD-10-CM

## 2019-09-23 DIAGNOSIS — E118 Type 2 diabetes mellitus with unspecified complications: Secondary | ICD-10-CM

## 2019-09-23 DIAGNOSIS — E785 Hyperlipidemia, unspecified: Secondary | ICD-10-CM

## 2019-09-23 DIAGNOSIS — Z0000 Encounter for general adult medical examination without abnormal findings: Secondary | ICD-10-CM | POA: Diagnosis not present

## 2019-09-23 DIAGNOSIS — E1169 Type 2 diabetes mellitus with other specified complication: Secondary | ICD-10-CM

## 2019-09-23 DIAGNOSIS — Z1211 Encounter for screening for malignant neoplasm of colon: Secondary | ICD-10-CM | POA: Diagnosis not present

## 2019-09-23 MED ORDER — TRIAMCINOLONE ACETONIDE 0.1 % EX CREA
TOPICAL_CREAM | Freq: Two times a day (BID) | CUTANEOUS | 0 refills | Status: DC | PRN
Start: 2019-09-23 — End: 2020-04-16

## 2019-09-23 MED ORDER — PANTOPRAZOLE SODIUM 40 MG PO TBEC: 40.0000 mg | DELAYED_RELEASE_TABLET | Freq: Every day | ORAL | 3 refills | Status: DC | PRN

## 2019-09-23 NOTE — Assessment & Plan Note (Signed)
Chronic, stable on current regimen.  A1c trending up.  Renew efforts at low sugar low carb diabetic diet.

## 2019-09-23 NOTE — Progress Notes (Signed)
This visit was conducted in person.  BP 118/80 (BP Location: Left Arm, Patient Position: Sitting, Cuff Size: Normal)   Pulse 84   Temp 98.4 F (36.9 C) (Temporal)   Ht 5' 9.5" (1.765 m)   Wt 189 lb 5 oz (85.9 kg)   SpO2 96%   BMI 27.56 kg/m    CC: CPE Subjective:    Patient ID: Gary Long, male    DOB: [birth year], 49 y.o.   MRN: [id number]  HPI: Gary Long is a 49 y.o. male presenting on 09/23/2019 for Annual Exam   Recent trip to [place], tolerated atovaquone/proguanil preventatively well, planning return later in the year  Preventative: No fmhx prostateor colon cancer.  Colon cancer screening - discussed, would like iFOB.  Flu shot yearly COVID vaccine - Moderna 01/2019, 02/2019 Pneumovax 03/2012 Tetanus - 03/2012 Seat belt use discussed Sunscreen use discussed. No changing moles on skin.  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 Interim medical history since our last visit reviewed. Allergies and medications reviewed and updated. Outpatient Medications Prior to Visit  Medication Sig Dispense Refill  . Accu-Chek FastClix Lancets MISC USE AS DIRECTED TO CHECK BLOOD SUGAR ONCE DAILY 100 each 0  . ACCU-CHEK GUIDE test strip . USE AS INSTRUCTED TO CHECK BLOOD SUGAR ONCE DAILY 50 strip 3  . atorvastatin (LIPITOR) 40 MG tablet TAKE 1 TABLET BY MOUTH EVERY DAY 90 tablet 0  . Blood Glucose Monitoring Suppl (ACCU-CHEK GUIDE) w/Device KIT 1 each by Does not apply route as needed. Use as directed to check blood sugar once daily 1 kit 0  . glimepiride (AMARYL) 2 MG tablet TAKE 1 TABLET BY MOUTH DAILY BEFORE BREAKFAST. 90 tablet 0  . metFORMIN  (GLUCOPHAGE) 500 MG tablet TAKE 1 TABLET (500 MG TOTAL) BY MOUTH 2 (TWO) TIMES DAILY WITH A MEAL. 180 tablet 0  . hydrOXYzine (ATARAX/VISTARIL) 25 MG tablet TAKE 1/2 TO 1 TABLET BY MOUTH 3 TIMES A DAY AS NEEDED FOR ITCHING (SEDATION PRECAUTIONS) (Patient not taking: Reported on 09/23/2019) 30 tablet 0  . Atovaquone-Proguanil HCl 62.5-25 MG tablet Take 1 tablet by mouth daily. Start 2 days prior to trip, continue 1 week after travel is complete 40 tablet 0  . predniSONE (DELTASONE) 10 MG tablet Take three tablets for 3 days followed by two tablets for 3 days followed by one tablet for 3 days 18 tablet 0  . triamcinolone cream (KENALOG) 0.1 % USE 1 APPLICATION TOPICALLY 2 (TWO) TIMES DAILY. APPLY TO AFFECTED AREA FOR MAX 2 WEEKS AT A TIME 80 g 0  . typhoid (VIVOTIF) DR capsule Take 1 capsule by mouth every other day. Start 2 weeks prior to travel 4 capsule 0   No facility-administered medications prior to visit.     Per HPI unless specifically indicated in ROS section below Review of Systems  Constitutional: Negative for activity change, appetite change, chills, fatigue, fever and unexpected weight change.  HENT: Negative for hearing loss.   Eyes: Negative for visual disturbance.  Respiratory: Negative for cough, chest tightness, shortness of breath and wheezing.   Cardiovascular: Positive for chest pain (indigestion/heartburn). Negative for palpitations and leg swelling.  Gastrointestinal: Negative for abdominal distention,  abdominal pain, blood in stool, constipation, diarrhea, nausea and vomiting.  Genitourinary: Negative for difficulty urinating and hematuria.  Musculoskeletal: Negative for arthralgias, myalgias and neck pain.  Skin: Negative for rash.  Neurological: Positive for headaches (TTH - stress related). Negative for dizziness, seizures and syncope.  Hematological: Negative for adenopathy. Does not bruise/bleed easily.  Psychiatric/Behavioral: Negative for dysphoric mood. The patient  is not nervous/anxious.    Objective:  BP 118/80 (BP Location: Left Arm, Patient Position: Sitting, Cuff Size: Normal)   Pulse 84   Temp 98.4 F (36.9 C) (Temporal)   Ht 5' 9.5" (1.765 m)   Wt 189 lb 5 oz (85.9 kg)   SpO2 96%   BMI 27.56 kg/m   Wt Readings from Last 3 Encounters:  09/23/19 189 lb 5 oz (85.9 kg)  05/14/19 189 lb 4 oz (85.8 kg)  04/21/19 193 lb (87.5 kg)      Physical Exam Vitals and nursing note reviewed.  Constitutional:      General: He is not in acute distress.    Appearance: Normal appearance. He is well-developed. He is not ill-appearing.  HENT:     Head: Normocephalic and atraumatic.     Right Ear: Hearing, tympanic membrane, ear canal and external ear normal.     Left Ear: Hearing, tympanic membrane, ear canal and external ear normal.  Eyes:     General: No scleral icterus.    Extraocular Movements: Extraocular movements intact.     Conjunctiva/sclera: Conjunctivae normal.     Pupils: Pupils are equal, round, and reactive to light.  Cardiovascular:     Rate and Rhythm: Normal rate and regular rhythm.     Pulses: Normal pulses.          Radial pulses are 2+ on the right side and 2+ on the left side.     Heart sounds: Normal heart sounds. No murmur heard.   Pulmonary:     Effort: Pulmonary effort is normal. No respiratory distress.     Breath sounds: Normal breath sounds. No wheezing, rhonchi or rales.  Abdominal:     General: Abdomen is flat. Bowel sounds are normal. There is no distension.     Palpations: Abdomen is soft. There is no mass.     Tenderness: There is no abdominal tenderness. There is no guarding or rebound.     Hernia: No hernia is present.  Musculoskeletal:        General: Normal range of motion.     Cervical back: Normal range of motion and neck supple.     Right lower leg: No edema.     Left lower leg: No edema.  Lymphadenopathy:     Cervical: No cervical adenopathy.  Skin:    General: Skin is warm and dry.     Findings:  No rash.  Neurological:     General: No focal deficit present.     Mental Status: He is alert and oriented to person, place, and time.     Comments: CN grossly intact, station and gait intact  Psychiatric:        Mood and Affect: Mood normal.        Behavior: Behavior normal.        Thought Content: Thought content normal.        Judgment: Judgment normal.       Results for orders placed or performed in visit on 09/22/19  Microalbumin / creatinine urine ratio  Result Value Ref Range   Microalb, Ur <0.7  0.0 - 1.9 mg/dL   Creatinine,U 119.3 mg/dL   Microalb Creat Ratio 0.6 0.0 - 30.0 mg/g  CBC with Differential/Platelet  Result Value Ref Range   WBC 5.0 4.0 - 10.5 K/uL   RBC 4.79 4.22 - 5.81 Mil/uL   Hemoglobin 13.5 13.0 - 17.0 g/dL   HCT 41.4 39 - 52 %   MCV 86.4 78.0 - 100.0 fl   MCHC 32.7 30.0 - 36.0 g/dL   RDW 14.2 11.5 - 15.5 %   Platelets 136.0 (L) 150 - 400 K/uL   Neutrophils Relative % 38.2 (L) 43 - 77 %   Lymphocytes Relative 47.9 (H) 12 - 46 %   Monocytes Relative 11.9 3 - 12 %   Eosinophils Relative 1.7 0 - 5 %   Basophils Relative 0.3 0 - 3 %   Neutro Abs 1.9 1.4 - 7.7 K/uL   Lymphs Abs 2.4 0.7 - 4.0 K/uL   Monocytes Absolute 0.6 0 - 1 K/uL   Eosinophils Absolute 0.1 0 - 0 K/uL   Basophils Absolute 0.0 0 - 0 K/uL  Hemoglobin A1c  Result Value Ref Range   Hgb A1c MFr Bld 7.0 (H) 4.6 - 6.5 %  Comprehensive metabolic panel  Result Value Ref Range   Sodium 138 135 - 145 mEq/L   Potassium 4.2 3.5 - 5.1 mEq/L   Chloride 104 96 - 112 mEq/L   CO2 28 19 - 32 mEq/L   Glucose, Bld 149 (H) 70 - 99 mg/dL   BUN 14 6 - 23 mg/dL   Creatinine, Ser 1.16 0.40 - 1.50 mg/dL   Total Bilirubin 0.4 0.2 - 1.2 mg/dL   Alkaline Phosphatase 77 39 - 117 U/L   AST 19 0 - 37 U/L   ALT 36 0 - 53 U/L   Total Protein 6.9 6.0 - 8.3 g/dL   Albumin 4.1 3.5 - 5.2 g/dL   GFR 80.92 >60.00 mL/min   Calcium 9.2 8.4 - 10.5 mg/dL  Lipid panel  Result Value Ref Range   Cholesterol 167 0 -  200 mg/dL   Triglycerides 79.0 0 - 149 mg/dL   HDL 54.50 >39.00 mg/dL   VLDL 15.8 0.0 - 40.0 mg/dL   LDL Cholesterol 97 0 - 99 mg/dL   Total CHOL/HDL Ratio 3    NonHDL 112.91    Assessment & Plan:  This visit occurred during the SARS-CoV-2 public health emergency.  Safety protocols were in place, including screening questions prior to the visit, additional usage of staff PPE, and extensive cleaning of exam room while observing appropriate contact time as indicated for disinfecting solutions.   Problem List Items Addressed This Visit    Hyperlipidemia associated with type 2 diabetes mellitus ([place])    Stable period, LDL a bit high. Continue lipitor. The 10-year ASCVD risk score Mikey Bussing DC Brooke Bonito., et al., 2013) is: 7.6%   Values used to calculate the score:     Age: 18 years     Sex: Male     Is Non-Hispanic African American: Yes     Diabetic: Yes     Tobacco smoker: No     Systolic Blood Pressure: 622 mmHg     Is BP treated: No     HDL Cholesterol: 54.5 mg/dL     Total Cholesterol: 167 mg/dL       Health maintenance examination - Primary    Preventative protocols reviewed and updated unless pt declined. Discussed healthy diet and lifestyle.  GERD (gastroesophageal reflux disease)    Intermittent symptoms - OTC prilosec not effective - will send in omeprazole 70m daily PRN (actually insurance prefers pantoprazole so this was sent in its place).       Relevant Medications   pantoprazole (PROTONIX) 40 MG tablet   Controlled diabetes mellitus type 2 with complications (HCC)    Chronic, stable on current regimen.  A1c trending up.  Renew efforts at low sugar low carb diabetic diet.        Other Visit Diagnoses    Need for influenza vaccination       Relevant Orders   Flu Vaccine QUAD 36+ mos IM (Completed)   Special screening for malignant neoplasms, colon       Relevant Orders   Fecal occult blood, imunochemical       Meds ordered this encounter  Medications  .  triamcinolone cream (KENALOG) 0.1 %    Sig: Apply topically 2 (two) times daily as needed. Max 10 days at a time    Dispense:  80 g    Refill:  0  . pantoprazole (PROTONIX) 40 MG tablet    Sig: Take 1 tablet (40 mg total) by mouth daily as needed (heartburn).    Dispense:  30 tablet    Refill:  3   Orders Placed This Encounter  Procedures  . Fecal occult blood, imunochemical    Standing Status:   Future    Standing Expiration Date:   09/22/2020  . Flu Vaccine QUAD 36+ mos IM    Patient instructions: Flu shot today  Pass by lab to pick up stool kit.  Call to schedule eye exam.  You are doing well today Renew efforts at low sugar low carb diabetic diet.  Return as needed or in 6 months for diabetes follow up visit, sooner if needed  Follow up plan: Return in about 6 months (around 03/22/2020) for follow up visit.  JRia Bush MD

## 2019-09-23 NOTE — Assessment & Plan Note (Signed)
Preventative protocols reviewed and updated unless pt declined. Discussed healthy diet and lifestyle.  

## 2019-09-23 NOTE — Patient Instructions (Addendum)
Flu shot today  Pass by lab to pick up stool kit.  Call to schedule eye exam.  You are doing well today Renew efforts at low sugar low carb diabetic diet.  Return as needed or in 6 months for diabetes follow up visit, sooner if needed  Health Maintenance, Male Adopting a healthy lifestyle and getting preventive care are important in promoting health and wellness. Ask your health care provider about:  The right schedule for you to have regular tests and exams.  Things you can do on your own to prevent diseases and keep yourself healthy. What should I know about diet, weight, and exercise? Eat a healthy diet   Eat a diet that includes plenty of vegetables, fruits, low-fat dairy products, and lean protein.  Do not eat a lot of foods that are high in solid fats, added sugars, or sodium. Maintain a healthy weight Body mass index (BMI) is a measurement that can be used to identify possible weight problems. It estimates body fat based on height and weight. Your health care provider can help determine your BMI and help you achieve or maintain a healthy weight. Get regular exercise Get regular exercise. This is one of the most important things you can do for your health. Most adults should:  Exercise for at least 150 minutes each week. The exercise should increase your heart rate and make you sweat (moderate-intensity exercise).  Do strengthening exercises at least twice a week. This is in addition to the moderate-intensity exercise.  Spend less time sitting. Even light physical activity can be beneficial. Watch cholesterol and blood lipids Have your blood tested for lipids and cholesterol at 49 years of age, then have this test every 5 years. You may need to have your cholesterol levels checked more often if:  Your lipid or cholesterol levels are high.  You are older than 49 years of age.  You are at high risk for heart disease. What should I know about cancer screening? Many types of  cancers can be detected early and may often be prevented. Depending on your health history and family history, you may need to have cancer screening at various ages. This may include screening for:  Colorectal cancer.  Prostate cancer.  Skin cancer.  Lung cancer. What should I know about heart disease, diabetes, and high blood pressure? Blood pressure and heart disease  High blood pressure causes heart disease and increases the risk of stroke. This is more likely to develop in people who have high blood pressure readings, are of African descent, or are overweight.  Talk with your health care provider about your target blood pressure readings.  Have your blood pressure checked: ? Every 3-5 years if you are 76-69 years of age. ? Every year if you are 59 years old or older.  If you are between the ages of 16 and 76 and are a current or former smoker, ask your health care provider if you should have a one-time screening for abdominal aortic aneurysm (AAA). Diabetes Have regular diabetes screenings. This checks your fasting blood sugar level. Have the screening done:  Once every three years after age 11 if you are at a normal weight and have a low risk for diabetes.  More often and at a younger age if you are overweight or have a high risk for diabetes. What should I know about preventing infection? Hepatitis B If you have a higher risk for hepatitis B, you should be screened for this virus. Talk with  your health care provider to find out if you are at risk for hepatitis B infection. Hepatitis C Blood testing is recommended for:  Everyone born from [birth year] through 1965.  Anyone with known risk factors for hepatitis C. Sexually transmitted infections (STIs)  You should be screened each year for STIs, including gonorrhea and chlamydia, if: ? You are sexually active and are younger than 50 years of age. ? You are older than 49 years of age and your health care provider tells you that you  are at risk for this type of infection. ? Your sexual activity has changed since you were last screened, and you are at increased risk for chlamydia or gonorrhea. Ask your health care provider if you are at risk.  Ask your health care provider about whether you are at high risk for HIV. Your health care provider may recommend a prescription medicine to help prevent HIV infection. If you choose to take medicine to prevent HIV, you should first get tested for HIV. You should then be tested every 3 months for as long as you are taking the medicine. Follow these instructions at home: Lifestyle  Do not use any products that contain nicotine or tobacco, such as cigarettes, e-cigarettes, and chewing tobacco. If you need help quitting, ask your health care provider.  Do not use street drugs.  Do not share needles.  Ask your health care provider for help if you need support or information about quitting drugs. Alcohol use  Do not drink alcohol if your health care provider tells you not to drink.  If you drink alcohol: ? Limit how much you have to 0-2 drinks a day. ? Be aware of how much alcohol is in your drink. In the [place] one drink equals one 12 oz bottle of beer (355 mL), one 5 oz glass of wine (148 mL), or one 1 oz glass of hard liquor (44 mL). General instructions  Schedule regular health, dental, and eye exams.  Stay current with your vaccines.  Tell your health care provider if: ? You often feel depressed. ? You have ever been abused or do not feel safe at home. Summary  Adopting a healthy lifestyle and getting preventive care are important in promoting health and wellness.  Follow your health care provider's instructions about healthy diet, exercising, and getting tested or screened for diseases.  Follow your health care provider's instructions on monitoring your cholesterol and blood pressure. This information is not intended to replace advice given to you by your health care  provider. Make sure you discuss any questions you have with your health care provider. Document Revised: 12/19/2017 Document Reviewed: 12/19/2017 Elsevier Patient Education  2020 [employer].

## 2019-09-23 NOTE — Assessment & Plan Note (Addendum)
Intermittent symptoms - OTC prilosec not effective - will send in omeprazole 40mg  daily PRN (actually insurance prefers pantoprazole so this was sent in its place).

## 2019-09-23 NOTE — Assessment & Plan Note (Signed)
Stable period, LDL a bit high. Continue lipitor. The 10-year ASCVD risk score Denman George DC Montez Hageman., et al., 2013) is: 7.6%   Values used to calculate the score:     Age: 49 years     Sex: Male     Is Non-Hispanic African American: Yes     Diabetic: Yes     Tobacco smoker: No     Systolic Blood Pressure: 118 mmHg     Is BP treated: No     HDL Cholesterol: 54.5 mg/dL     Total Cholesterol: 167 mg/dL

## 2019-09-24 ENCOUNTER — Telehealth: Payer: Self-pay | Admitting: *Deleted

## 2019-09-24 ENCOUNTER — Other Ambulatory Visit (INDEPENDENT_AMBULATORY_CARE_PROVIDER_SITE_OTHER): Payer: BLUE CROSS/BLUE SHIELD

## 2019-09-24 DIAGNOSIS — Z1211 Encounter for screening for malignant neoplasm of colon: Secondary | ICD-10-CM | POA: Diagnosis not present

## 2019-09-24 LAB — FECAL OCCULT BLOOD, GUAIAC: Fecal Occult Blood: NEGATIVE

## 2019-09-24 LAB — FECAL OCCULT BLOOD, IMMUNOCHEMICAL: Fecal Occult Bld: NEGATIVE

## 2019-09-24 NOTE — Telephone Encounter (Signed)
Received fax from [facility] requesting PA for Pantoprazole 40 mg.  PA completed on CoverMyMeds and sent for review.  Can take up to 5 business days for a decision.

## 2019-09-25 ENCOUNTER — Encounter: Payer: Self-pay | Admitting: Family Medicine

## 2019-09-29 NOTE — Telephone Encounter (Signed)
Lvm asking pt to call back.  Need to relay Dr. G's message.  

## 2019-09-29 NOTE — Telephone Encounter (Signed)
Received faxed PA denial.  Reason:  This medication is covered when 2 OTC generic PPIs (such as omeprazole , lansoprazole) have been tried and did not work, ore when the member cannot take all other OTC generic  PPI meds.

## 2019-09-29 NOTE — Telephone Encounter (Signed)
Plz notify insurance says we must first try 2nd OTC PPI med - would have him pick up nexium or lansoprazole OTC, may take 1-2 tablets as needed - and update [place] with effect. Alternatively he can pay out of pocket for pantoprazole.

## 2019-09-30 NOTE — Telephone Encounter (Signed)
Spoke with pt relaying Dr. Timoteo Expose message.  Pt verbalizes understanding.  Says he will try OTC med 1st and let Dr. Reece Agar know how it works.

## 2019-10-01 ENCOUNTER — Telehealth (INDEPENDENT_AMBULATORY_CARE_PROVIDER_SITE_OTHER): Payer: BLUE CROSS/BLUE SHIELD | Admitting: Family Medicine

## 2019-10-01 ENCOUNTER — Telehealth: Payer: Self-pay | Admitting: Family Medicine

## 2019-10-01 ENCOUNTER — Other Ambulatory Visit: Payer: BLUE CROSS/BLUE SHIELD

## 2019-10-01 DIAGNOSIS — J029 Acute pharyngitis, unspecified: Secondary | ICD-10-CM | POA: Insufficient documentation

## 2019-10-01 DIAGNOSIS — Z20822 Contact with and (suspected) exposure to covid-19: Secondary | ICD-10-CM

## 2019-10-01 NOTE — Telephone Encounter (Signed)
[facility] Night - Client TELEPHONE ADVICE RECORD [facility] Patient Name: Gary Long Gender: Male DOB: 1971/01/04 Age: 49 Y 2 M Return Phone Number: [phone] (Primary) Address: City/State/Zip: [place] [address] Client [facility] Night - Client Client Site [facility] - Night Physician Eustaquio Boyden - MD Contact Type Call Who Is Calling Patient / Member / Family / Caregiver Call Type Triage / Clinical Relationship To Patient Self Return Phone Number [phone] (Primary) Chief Complaint Headache Reason for Call Symptomatic / Request for Health Information Initial Comment Caller states he would like to schedule an appointment to be seen today. Caller states he is having a sore throat and headache. Translation No Nurse Assessment Nurse: Thurmond Butts, RN, Meriam Sprague Date/Time (Eastern Time): 10/01/2019 7:16:38 AM Confirm and document reason for call. If symptomatic, describe symptoms. ---Caller states he has headache and sore throat . Started yesterday morning. Mucous drainage down back of throat. no fever. Has the patient had close contact with a person known or suspected to have the novel coronavirus illness OR traveled / lives in area with major community spread (including international travel) in the last 14 days from the onset of symptoms? * If Asymptomatic, screen for exposure and travel within the last 14 days. ---No Does the patient have any new or worsening symptoms? ---Yes Will a triage be completed? ---Yes Related visit to physician within the last 2 weeks? ---No Does the PT have any chronic conditions? (i.e. diabetes, asthma, this includes High risk factors for pregnancy, etc.) ---Yes List chronic conditions. ---diabetes Is this a behavioral health or substance abuse call? ---No Guidelines Guideline Title Affirmed Question Affirmed Notes Nurse Date/Time (Eastern Time) COVID-19 -  Diagnosed or Suspected HIGH RISK for severe COVID complications (e.g., age > 64 years, obesity with BMI > 25, pregnant, chronic lung disease or other chronic medical condition) Thurmond Butts, RN, [facility] 10/01/2019 7:19:31 AM PLEASE NOTE: All timestamps contained within this report are represented as [place] Standard Time. CONFIDENTIALTY NOTICE: This fax transmission is intended only for the addressee. It contains information that is legally privileged, confidential or otherwise protected from use or disclosure. If you are not the intended recipient, you are strictly prohibited from reviewing, disclosing, copying using or disseminating any of this information or taking any action in reliance on or regarding this information. If you have received this fax in error, please notify us immediately by telephone so that we can arrange for its return to [place]. Phone: [phone], Toll-Free: [phone], Fax: [phone] Page: 2 of 2 Call Id: [id number] Guidelines Guideline Title Affirmed Question Affirmed Notes Nurse Date/Time Lamount Cohen Time) (Exception: Already seen by PCP and no new or worsening symptoms.) Disp. Time Lamount Cohen Time) Disposition Final User 10/01/2019 7:22:30 AM Paged On Call back to [facility], RNMeriam Sprague 10/01/2019 7:23:54 AM Paged On Call back to [facility], RNMeriam Sprague 10/01/2019 7:38:50 AM Paged On Call back to [facility], RN, Meriam Sprague 10/01/2019 7:50:30 AM Paged On Call back to [facility], RN, Meriam Sprague 10/01/2019 7:21:56 AM Call PCP Now Yes Thurmond Butts, RN, [facility] Disagree/Comply Comply Caller Understands Yes PreDisposition Call Doctor Care Advice Given Per Guideline CALL PCP NOW: * I'll page the on-call provider now. If you haven't heard from the provider (or me) within 30 minutes, call again. CARE ADVICE given per COVID-19 - DIAGNOSED OR SUSPECTED (Adult) guideline. Comments User: Bradly Chris, RN Date/Time Lamount Cohen Time): 10/01/2019 7:54:05 AM instructed  pt per the on call Dr and  he verbalized understanding. Paging DoctorName Phone DateTime Result/Outcome Message Type Notes Elizabeth Palau- MD [phone] 10/01/2019 7:22:30 AM Paged On Call Back to Call Center Doctor Paged Elizabeth Palau- MD [phone] 10/01/2019 7:38:50 AM Paged On Call Back to Call Center Doctor Paged Elizabeth Palau- MD [phone] 10/01/2019 7:50:30 AM Paged On Call Back to Call Center Doctor Paged Elizabeth Palau- MD 10/01/2019 7:53:30 AM Spoke with On Call - General Message Result Report given. Have pt call the office at 8 am. and ask for a virtual vist with his doctor.

## 2019-10-01 NOTE — Telephone Encounter (Signed)
I spoke with pt; pt scheduled virtual appt with Dr Reece Agar today at 11:15 AM per Dr Reece Agar.

## 2019-10-01 NOTE — Telephone Encounter (Signed)
Pt called to schedule he has headache sore throat this started yesterday morning.  He has not be around anyone with or suspected of covid. Can pt be worked in for [employer]

## 2019-10-01 NOTE — Assessment & Plan Note (Signed)
With headache - anticipate viral pharyngitis.  Not consistent with strep as no fever, LAD or noted pharyngeal exudates.  He did get swabbed for COVID this morning - suggested he stay home in isolation until results return, anticipate will be negative.  Supportive care reviewed including salt water gargles, tylenol/ibuprofen PRN, honey with lemon, herbal tea, popsicle, etc. May continue OTC lozenges and spray.

## 2019-10-01 NOTE — Progress Notes (Signed)
Virtual visit completed through MyChart, a video enabled telemedicine application. Due to national recommendations of social distancing due to COVID-19, a virtual visit is felt to be most appropriate for this patient at this time. Reviewed limitations, risks, security and privacy concerns of performing a virtual visit and the availability of in person appointments. I also reviewed that there may be a patient responsible charge related to this service. The patient agreed to proceed.   Patient location: home Provider location: [place] [facility], office Persons participating in this virtual visit: patient, provider   If any vitals were documented, they were collected by patient at home unless specified below.    BP 127/80   Pulse 92   Ht 5' 9.5" (1.765 m)   BMI 27.56 kg/m    CC: ST, HA Subjective:    Patient ID: Gary Long, male    DOB: [birth year], 49 y.o.   MRN: [id number]  HPI: Gary Long is a 49 y.o. male presenting on 10/01/2019 for Sore Throat (C/o sore throat and HA.  Sxs started yesterday.  Denies any other sxs.  Tried OTC throat spray. )   1d h/o ST, R sided HA, nasal congestion. Some body aches.  No white spots on mouth.  Managing with throat lozenges and chloraseptic spray.   No sick contacts at home.  Completed COVID vaccine.  He had nasal swab for COVID done today [facility].   No cough, fevers/chills, ear or tooth pain, loss of taste or smell, abd pain, diarrhea, nausea. No sinus pain/pressure.   Staying home from work.       Relevant past medical, surgical, family and social history reviewed and updated as indicated. Interim medical history since our last visit reviewed. Allergies and medications reviewed and updated. Outpatient Medications Prior to Visit  Medication Sig Dispense Refill  . Accu-Chek FastClix Lancets MISC USE AS DIRECTED TO CHECK BLOOD SUGAR ONCE DAILY 100 each 0  . ACCU-CHEK GUIDE test strip . USE AS  INSTRUCTED TO CHECK BLOOD SUGAR ONCE DAILY 50 strip 3  . atorvastatin (LIPITOR) 40 MG tablet TAKE 1 TABLET BY MOUTH EVERY DAY 90 tablet 0  . Blood Glucose Monitoring Suppl (ACCU-CHEK GUIDE) w/Device KIT 1 each by Does not apply route as needed. Use as directed to check blood sugar once daily 1 kit 0  . glimepiride (AMARYL) 2 MG tablet TAKE 1 TABLET BY MOUTH DAILY BEFORE BREAKFAST. 90 tablet 0  . metFORMIN (GLUCOPHAGE) 500 MG tablet TAKE 1 TABLET (500 MG TOTAL) BY MOUTH 2 (TWO) TIMES DAILY WITH A MEAL. 180 tablet 0  . pantoprazole (PROTONIX) 40 MG tablet Take 1 tablet (40 mg total) by mouth daily as needed (heartburn). 30 tablet 3  . triamcinolone cream (KENALOG) 0.1 % Apply topically 2 (two) times daily as needed. Max 10 days at a time 80 g 0  . hydrOXYzine (ATARAX/VISTARIL) 25 MG tablet TAKE 1/2 TO 1 TABLET BY MOUTH 3 TIMES A DAY AS NEEDED FOR ITCHING (SEDATION PRECAUTIONS) (Patient not taking: Reported on 09/23/2019) 30 tablet 0   No facility-administered medications prior to visit.     Per HPI unless specifically indicated in ROS section below Review of Systems Objective:  BP 127/80   Pulse 92   Ht 5' 9.5" (1.765 m)   BMI 27.56 kg/m   Wt Readings from Last 3 Encounters:  09/23/19 189 lb 5 oz (85.9 kg)  05/14/19 189 lb 4 oz (85.8 kg)  04/21/19 193 lb (87.5 kg)  Physical exam: Gen: alert, NAD, not ill appearing Pulm: speaks in complete sentences without increased work of breathing Psych: normal mood, normal thought content      Assessment & Plan:   Problem List Items Addressed This Visit    Pharyngitis, acute    With headache - anticipate viral pharyngitis.  Not consistent with strep as no fever, LAD or noted pharyngeal exudates.  He did get swabbed for COVID this morning - suggested he stay home in isolation until results return, anticipate will be negative.  Supportive care reviewed including salt water gargles, tylenol/ibuprofen PRN, honey with lemon, herbal tea,  popsicle, etc. May continue OTC lozenges and spray.           No orders of the defined types were placed in this encounter.  No orders of the defined types were placed in this encounter.   I discussed the assessment and treatment plan with the patient. The patient was provided an opportunity to ask questions and all were answered. The patient agreed with the plan and demonstrated an understanding of the instructions. The patient was advised to call back or seek an in-person evaluation if the symptoms worsen or if the condition fails to improve as anticipated.  Follow up plan: No follow-ups on file.  Ria Bush, MD

## 2019-10-03 LAB — NOVEL CORONAVIRUS, NAA: SARS-CoV-2, NAA: NOT DETECTED

## 2019-10-03 LAB — SARS-COV-2, NAA 2 DAY TAT

## 2019-10-14 ENCOUNTER — Telehealth: Payer: Self-pay

## 2019-10-14 ENCOUNTER — Other Ambulatory Visit: Payer: Self-pay

## 2019-10-14 ENCOUNTER — Ambulatory Visit (INDEPENDENT_AMBULATORY_CARE_PROVIDER_SITE_OTHER): Payer: Self-pay

## 2019-10-14 DIAGNOSIS — Z0000 Encounter for general adult medical examination without abnormal findings: Secondary | ICD-10-CM

## 2019-10-14 NOTE — Telephone Encounter (Signed)
Spoke with pt notifying him the form is ready.  Pt requests form be faxed.  Faxed form.  Made copy to scan and mailed original to pt.

## 2019-10-14 NOTE — Telephone Encounter (Signed)
Filled and in Lisa's box 

## 2019-10-14 NOTE — Progress Notes (Signed)
Pt scheduled for [facility] to have color vision checked for PE form.  Pt passed test.  Documented in Screening tab.

## 2019-10-14 NOTE — Telephone Encounter (Signed)
Pt dropped off PE form to be completed (CPE, 09/23/19).  Did color vision screen at [facility] today.  Placed form in Dr. Timoteo Expose box.

## 2019-11-20 ENCOUNTER — Emergency Department: Payer: BC Managed Care – PPO

## 2019-11-20 ENCOUNTER — Emergency Department
Admission: EM | Admit: 2019-11-20 | Discharge: 2019-11-20 | Disposition: A | Discharge status: Home / Self Care | Payer: BC Managed Care – PPO | Attending: Emergency Medicine | Admitting: Emergency Medicine

## 2019-11-20 ENCOUNTER — Other Ambulatory Visit: Payer: Self-pay

## 2019-11-20 DIAGNOSIS — E119 Type 2 diabetes mellitus without complications: Secondary | ICD-10-CM | POA: Diagnosis not present

## 2019-11-20 DIAGNOSIS — Z20822 Contact with and (suspected) exposure to covid-19: Secondary | ICD-10-CM | POA: Insufficient documentation

## 2019-11-20 DIAGNOSIS — R519 Headache, unspecified: Secondary | ICD-10-CM

## 2019-11-20 DIAGNOSIS — I10 Essential (primary) hypertension: Secondary | ICD-10-CM | POA: Diagnosis not present

## 2019-11-20 DIAGNOSIS — M542 Cervicalgia: Secondary | ICD-10-CM | POA: Insufficient documentation

## 2019-11-20 DIAGNOSIS — R0602 Shortness of breath: Secondary | ICD-10-CM

## 2019-11-20 DIAGNOSIS — Z7984 Long term (current) use of oral hypoglycemic drugs: Secondary | ICD-10-CM | POA: Diagnosis not present

## 2019-11-20 LAB — BASIC METABOLIC PANEL
Anion gap: 10 (ref 5–15)
BUN: 19 mg/dL (ref 6–20)
CO2: 24 mmol/L (ref 22–32)
Calcium: 9.2 mg/dL (ref 8.9–10.3)
Chloride: 100 mmol/L (ref 98–111)
Creatinine, Ser: 1.34 mg/dL — ABNORMAL HIGH (ref 0.61–1.24)
GFR, Estimated: >60 mL/min (ref >60)
Glucose, Bld: 90 mg/dL (ref 70–99)
Potassium: 3.9 mmol/L (ref 3.5–5.1)
Sodium: 134 mmol/L — ABNORMAL LOW (ref 135–145)

## 2019-11-20 LAB — RESPIRATORY PANEL BY RT PCR (FLU A&B, COVID)
Influenza A by PCR: NEGATIVE
Influenza B by PCR: NEGATIVE
SARS Coronavirus 2 by RT PCR: NEGATIVE

## 2019-11-20 LAB — CBC
HCT: 42.1 % (ref 39.0–52.0)
Hemoglobin: 13.8 g/dL (ref 13.0–17.0)
MCH: 27.8 pg (ref 26.0–34.0)
MCHC: 32.8 g/dL (ref 30.0–36.0)
MCV: 84.9 fL (ref 80.0–100.0)
Platelets: 149 10*3/uL — ABNORMAL LOW (ref 150–400)
RBC: 4.96 MIL/uL (ref 4.22–5.81)
RDW: 12.9 % (ref 11.5–15.5)
WBC: 5.4 10*3/uL (ref 4.0–10.5)
nRBC: 0 % (ref 0.0–0.2)

## 2019-11-20 LAB — TROPONIN I (HIGH SENSITIVITY): Troponin I (High Sensitivity): 3 ng/L (ref <18)

## 2019-11-20 MED ORDER — MAGNESIUM SULFATE 2 GM/50ML IV SOLN
2.0000 g | Freq: Once | INTRAVENOUS | Status: AC
  Administered 2019-11-20: 2 g via INTRAVENOUS
  Filled 2019-11-20: qty 50

## 2019-11-20 MED ORDER — PROCHLORPERAZINE EDISYLATE 10 MG/2ML IJ SOLN
10.0000 mg | Freq: Once | INTRAMUSCULAR | Status: AC
  Administered 2019-11-20: 10 mg via INTRAVENOUS
  Filled 2019-11-20: qty 2

## 2019-11-20 MED ORDER — LACTATED RINGERS IV BOLUS
1000.0000 mL | Freq: Once | INTRAVENOUS | Status: AC
  Administered 2019-11-20: 1000 mL via INTRAVENOUS

## 2019-11-20 NOTE — ED Provider Notes (Signed)
[facility] Emergency Department Provider Note  ____________________________________________   First MD Initiated Contact with Patient 11/20/19 1722     (approximate)  I have reviewed the triage vital signs and the nursing notes.   HISTORY  Chief Complaint Dizziness and Headache   HPI Gary Long is a 49 y.o. male with past medical history of HTN, HDL, DM and malaria who presents for assessment approximately 1 week of right-sided throbbing headache rating down the back of his neck.  Patient states he also has some sensitivity to light and over the last 2 days has had a little bit of shortness of breath.  He denies any recent falls or traumatic injuries.  He is not on a blood thinner.  He has never had a headache like this before.  He states he otherwise has been in his usual state health without any recent fevers, chills, cough, congestion, sore throat, chest pain, shortness of breath abdominal pain, nausea, vomiting, diarrhea, dysuria, rash, focal extremity weakness numbness or tingling.  He denies any illegal drug use, tobacco abuse,         Past Medical History:  Diagnosis Date  . Diabetes mellitus type 2, controlled, without complications ([place]) 15/72/6203  . History of malaria 1999  . HLD (hyperlipidemia) 06/13/2013  . Hyperlipidemia     Patient Active Problem List   Diagnosis Date Noted  . Pharyngitis, acute 10/01/2019  . Pruritic rash 03/17/2019  . Acute pain of left shoulder 08/08/2018  . Seasonal allergic rhinitis 03/29/2018  . Travel advice encounter 10/29/2017  . GERD (gastroesophageal reflux disease) 10/24/2016  . Complex tear of medial meniscus of right knee as current injury 06/15/2016  . Unilateral primary osteoarthritis, right knee 06/15/2016  . Palpitations 05/10/2016  . Transaminitis 05/10/2016  . Acute pain of right knee 04/20/2016  . Health maintenance examination 11/27/2013  . Controlled diabetes mellitus type 2 with  complications ([place]) 55/97/4163  . Frequent urination 11/27/2013  . Premature ejaculation 11/27/2013  . Lateral meniscal tear 11/27/2013  . Hyperlipidemia associated with type 2 diabetes mellitus ([place]) 06/13/2013  . Fatigue 12/10/2012    Past Surgical History:  Procedure Laterality Date  . KNEE ARTHROSCOPY Right 2002   ?ACL repair  . KNEE ARTHROSCOPY WITH DRILLING/MICROFRACTURE Right 06/21/2016   Procedure: KNEE ARTHROSCOPY WITH DRILLING/MICROFRACTURE;  Surgeon: Leandrew Koyanagi, MD;  Location: [place];  Service: Orthopedics;  Laterality: Right;  . KNEE ARTHROSCOPY WITH MEDIAL MENISECTOMY Right 06/21/2016   Procedure: RIGHT KNEE ARTHROSCOPY WITH PARTIAL MEDIAL AND LATERAL MENISCECTOMIES, SYNOVECTOMY,  MICROFRACTURE, SUBCHONDROPLASTY;  Surgeon: Leandrew Koyanagi, MD;  Location: [place];  Service: Orthopedics;  Laterality: Right;  . KNEE ARTHROSCOPY WITH SUBCHONDROPLASTY Right 06/21/2016   Procedure: KNEE ARTHROSCOPY WITH SUBCHONDROPLASTY;  Surgeon: Leandrew Koyanagi, MD;  Location: [place];  Service: Orthopedics;  Laterality: Right;    Prior to Admission medications   Medication Sig Start Date End Date Taking? Authorizing Provider  Accu-Chek FastClix Lancets MISC USE AS DIRECTED TO CHECK BLOOD SUGAR ONCE DAILY 01/20/19   Ria Bush, MD  ACCU-CHEK GUIDE test strip . USE AS INSTRUCTED TO CHECK BLOOD SUGAR ONCE DAILY 05/07/19   Ria Bush, MD  atorvastatin (LIPITOR) 40 MG tablet TAKE 1 TABLET BY MOUTH EVERY DAY 09/19/19   Ria Bush, MD  Blood Glucose Monitoring Suppl (ACCU-CHEK GUIDE) w/Device KIT 1 each by Does not apply route as needed. Use as directed to check blood sugar once daily 07/19/17   Danise Mina,  Garlon Hatchet, MD  glimepiride (AMARYL) 2 MG tablet TAKE 1 TABLET BY MOUTH DAILY BEFORE BREAKFAST. 09/17/19   Ria Bush, MD  metFORMIN (GLUCOPHAGE) 500 MG tablet TAKE 1 TABLET (500 MG TOTAL) BY MOUTH 2 (TWO) TIMES DAILY WITH A MEAL.  09/08/19   Ria Bush, MD  pantoprazole (PROTONIX) 40 MG tablet Take 1 tablet (40 mg total) by mouth daily as needed (heartburn). 09/23/19   Ria Bush, MD  triamcinolone cream (KENALOG) 0.1 % Apply topically 2 (two) times daily as needed. Max 10 days at a time 09/23/19   Ria Bush, MD    Allergies Chloroquine, Hydrocodone, and Percocet [oxycodone-acetaminophen]  Family History  Problem Relation Age of Onset  . Stroke Mother 31  . Diabetes Mother 66  . Hypertension Sister   . Diabetes Sister   . Cancer Neg Hx   . CAD Neg Hx     Social History Social History   Tobacco Use  . Smoking status: Never Smoker  . Smokeless tobacco: Never Used  Vaping Use  . Vaping Use: Never used  Substance Use Topics  . Alcohol use: Yes    Alcohol/week: 0.0 - 1.0 standard drinks  . Drug use: No    Review of Systems  Review of Systems  Constitutional: Negative for chills and fever.  HENT: Negative for sore throat.   Eyes: Negative for pain.  Respiratory: Positive for shortness of breath. Negative for cough and stridor.   Cardiovascular: Negative for chest pain.  Gastrointestinal: Negative for vomiting.  Genitourinary: Negative for dysuria.  Musculoskeletal: Negative for myalgias.  Skin: Negative for rash.  Neurological: Positive for dizziness and headaches. Negative for seizures and loss of consciousness.  Psychiatric/Behavioral: Negative for suicidal ideas.  All other systems reviewed and are negative.     ____________________________________________   PHYSICAL EXAM:  VITAL SIGNS: ED Triage Vitals  Enc Vitals Group     BP 11/20/19 1530 140/82     Pulse Rate 11/20/19 1530 79     Resp 11/20/19 1530 18     Temp 11/20/19 1530 98.3 F (36.8 C)     Temp src --      SpO2 11/20/19 1530 96 %     Weight 11/20/19 1529 188 lb (85.3 kg)     Height 11/20/19 1529 _0  (1.753 m)     Head Circumference --      Peak Flow --      Pain Score 11/20/19 1528 10     Pain Loc  --      Pain Edu? --      Excl. in [place]? --    Vitals:   11/20/19 1530  BP: 140/82  Pulse: 79  Resp: 18  Temp: 98.3 F (36.8 C)  SpO2: 96%   Physical Exam Vitals and nursing note reviewed.  Constitutional:      Appearance: He is well-developed.  HENT:     Head: Normocephalic and atraumatic.     Right Ear: External ear normal.     Left Ear: External ear normal.     Nose: Nose normal.     Mouth/Throat:     Mouth: Mucous membranes are moist.  Eyes:     Conjunctiva/sclera: Conjunctivae normal.  Cardiovascular:     Rate and Rhythm: Normal rate and regular rhythm.     Pulses: Normal pulses.     Heart sounds: No murmur heard.   Pulmonary:     Effort: Pulmonary effort is normal. No respiratory distress.     Breath sounds:  Normal breath sounds.  Abdominal:     Palpations: Abdomen is soft.     Tenderness: There is no abdominal tenderness.  Musculoskeletal:     Cervical back: Neck supple.     Right lower leg: No edema.     Left lower leg: No edema.  Skin:    General: Skin is warm and dry.  Neurological:     Mental Status: He is alert and oriented to person, place, and time.  Psychiatric:        Mood and Affect: Mood normal.     Cranial nerves II through XII grossly intact.  No pronator drift.  No finger dysmetria.  Symmetric 5/5 strength of all extremities.  Sensation intact to light touch in all extremities.  Unremarkable unassisted gait.  Patient does have some tenderness over his right parietal occipital and posterior cervical muscles.  Oropharynx unremarkable.  Patient is [place] motion of his neck.  ____________________________________________   LABS (all labs ordered are listed, but only abnormal results are displayed)  Labs Reviewed  BASIC METABOLIC PANEL - Abnormal; Notable for the following components:      Result Value   Sodium 134 (*)    Creatinine, Ser 1.34 (*)    All other components within normal limits  CBC - Abnormal; Notable for the following  components:   Platelets 149 (*)    All other components within normal limits  RESPIRATORY PANEL BY RT PCR (FLU A&B, COVID)  CBG MONITORING, ED  TROPONIN I (HIGH SENSITIVITY)  TROPONIN I (HIGH SENSITIVITY)   ____________________________________________  EKG  Sinus rhythm with a ventricular rate of 86, normal axis, unremarkable intervals, some significant artifact in lead I with apparent nonspecific ST change artifact in lead II without any other evidence of acute ischemia or other significant arrhythmia. ____________________________________________  RADIOLOGY  ED MD interpretation: CT head shows no evidence of mass-effect, hemorrhage, or other acute intracranial process.  Chest x-ray shows no evidence of pneumonia, effusion, overt edema cardiomediastinum, pneumothorax, or other acute intrathoracic process.  Official radiology report(s): DG Chest 2 View  Result Date: 11/20/2019 CLINICAL DATA:  Short of breath EXAM: CHEST - 2 VIEW COMPARISON:  03/03/2015 FINDINGS: The heart size and mediastinal contours are within normal limits. Both lungs are clear. The visualized skeletal structures are unremarkable. IMPRESSION: No active cardiopulmonary disease. Electronically Signed   By: Donavan Foil M.D.   On: 11/20/2019 18:08   CT Head Wo Contrast  Result Date: 11/20/2019 CLINICAL DATA:  Headache dizziness EXAM: CT HEAD WITHOUT CONTRAST TECHNIQUE: Contiguous axial images were obtained from the base of the skull through the vertex without intravenous contrast. COMPARISON:  None. FINDINGS: Brain: No evidence of acute infarction, hemorrhage, hydrocephalus, extra-axial collection or mass lesion/mass effect. Vascular: No hyperdense vessel or unexpected calcification. Skull: Normal. Negative for fracture or focal lesion. Sinuses/Orbits: No acute finding. Other: None IMPRESSION: Negative non contrasted CT appearance of the brain. Electronically Signed   By: Donavan Foil M.D.   On: 11/20/2019 18:08     ____________________________________________   PROCEDURES  Procedure(s) performed (including Critical Care):  .1-3 Lead EKG Interpretation Performed by: Lucrezia Starch, MD Authorized by: Lucrezia Starch, MD     Interpretation: normal     ECG rate assessment: normal     Rhythm: sinus rhythm     Ectopy: none     Conduction: normal       ____________________________________________   INITIAL IMPRESSION / ASSESSMENT AND PLAN / ED COURSE  Patient presents with left to history exam for assessment of headache with going on over the past week as described above as well as 2 days of shortness of breath.  Patient is afebrile hemodynamically stable arrival.  With regard to headache differential includes but is not limited to primary headache disorder including migraine versus tension headache versus secondary headache from intracranial hemorrhage, acute infectious process, temporal arteritis.  No historical or exam findings to suggest traumatic injury.  No exam findings fever or other historical features to suggest an acute infectious process.  Note tenderness over the temporal areas jaw claudication symptoms or other historical or exam features which suggest temporal arteritis.  Patient given below noted analgesia and on reassessment stated he had improvement in his headache.  Low suspicion for intracranial life-threatening process as etiology of his headache.  Believe patient is stable for outpatient follow-up for this.  With regard to patient's shortness of breath differential includes but is not limited to ACS, arrhythmia, PE, anemia, metabolic derangements, and Covid.  Very low suspicion for ACS given patient denies any chest pain and has a nonelevated plan obtain creatinine 3 hours after symptom onset.  Evidence of pneumothorax, effusion, edema, volume overload, acute anemia or significant metabolic derangement on above work-up.  Given patient's not tachycardic, hypoxic or  tachypneic believe he is stable for outpatient follow-up and is certainly possible he has bronchitis versus other nonemergent pathology.    ____________________________________________   FINAL CLINICAL IMPRESSION(S) / ED DIAGNOSES  Final diagnoses:  Nonintractable headache, unspecified chronicity pattern, unspecified headache type  SOB (shortness of breath)    Medications  magnesium sulfate IVPB 2 g 50 mL (2 g Intravenous New Bag/Given 11/20/19 1808)  lactated ringers bolus 1,000 mL (1,000 mLs Intravenous New Bag/Given 11/20/19 1807)  prochlorperazine (COMPAZINE) injection 10 mg (10 mg Intravenous Given 11/20/19 1807)     ED Discharge Orders    None       Note:  This document was prepared using Dragon voice recognition software and may include unintentional dictation errors.   Lucrezia Starch, MD 11/20/19 [phone]

## 2019-11-20 NOTE — ED Triage Notes (Signed)
Pt comes via POV from home with c/o headache and dizziness for two days. Pt states 10/10 pain. Pt states little SOB.

## 2019-11-27 ENCOUNTER — Telehealth: Payer: Self-pay | Admitting: Family Medicine

## 2019-11-27 NOTE — Telephone Encounter (Signed)
Spoke with pt asking if he is having any other sxs.  Denies any other sxs, just HA.  Informed pt his visit will be changed to an OV still at 11:00.  Pt verbalizes understanding.   Changed visit type.

## 2019-11-27 NOTE — Telephone Encounter (Signed)
If only symptom is headache, ok to do in office.  He did have negative COVID PCR at ER

## 2019-11-27 NOTE — Telephone Encounter (Signed)
Pt called in wanted to know if he can switch his appointment from virtual to in office due to he was checked out at the ER last week 11/20/19 and he is still having the headache.

## 2019-11-28 ENCOUNTER — Encounter: Payer: Self-pay | Admitting: Family Medicine

## 2019-11-28 ENCOUNTER — Ambulatory Visit (INDEPENDENT_AMBULATORY_CARE_PROVIDER_SITE_OTHER): Payer: BC Managed Care – PPO | Admitting: Family Medicine

## 2019-11-28 ENCOUNTER — Other Ambulatory Visit: Payer: Self-pay

## 2019-11-28 VITALS — BP 112/70 | HR 103 | Temp 97.6°F | Ht 69.0 in | Wt 192.4 lb

## 2019-11-28 DIAGNOSIS — R519 Headache, unspecified: Secondary | ICD-10-CM | POA: Insufficient documentation

## 2019-11-28 LAB — COMPREHENSIVE METABOLIC PANEL
ALT: 23 U/L (ref 0–53)
AST: 16 U/L (ref 0–37)
Albumin: 4 g/dL (ref 3.5–5.2)
Alkaline Phosphatase: 76 U/L (ref 39–117)
BUN: 14 mg/dL (ref 6–23)
CO2: 28 mEq/L (ref 19–32)
Calcium: 8.8 mg/dL (ref 8.4–10.5)
Chloride: 103 mEq/L (ref 96–112)
Creatinine, Ser: 1.11 mg/dL (ref 0.40–1.50)
GFR: 78.07 mL/min (ref >60.00)
Glucose, Bld: 144 mg/dL — ABNORMAL HIGH (ref 70–99)
Potassium: 4.1 mEq/L (ref 3.5–5.1)
Sodium: 138 mEq/L (ref 135–145)
Total Bilirubin: 0.3 mg/dL (ref 0.2–1.2)
Total Protein: 6.8 g/dL (ref 6.0–8.3)

## 2019-11-28 LAB — SEDIMENTATION RATE: Sed Rate: 12 mm/hr (ref 0–15)

## 2019-11-28 LAB — TSH: TSH: 1.59 u[IU]/mL (ref 0.35–4.50)

## 2019-11-28 LAB — C-REACTIVE PROTEIN: CRP: <1 mg/dL (ref 0.5–20.0)

## 2019-11-28 MED ORDER — PREDNISONE 20 MG PO TABS: ORAL_TABLET | ORAL | 0 refills | Status: AC

## 2019-11-28 MED ORDER — CYCLOBENZAPRINE HCL 10 MG PO TABS
5.0000 mg | ORAL_TABLET | Freq: Three times a day (TID) | ORAL | 1 refills | Status: DC | PRN
Start: 2019-11-28 — End: 2020-10-22

## 2019-11-28 NOTE — Progress Notes (Addendum)
Patient ID: Gary Long, male    DOB: October 31, 1970, 49 y.o.   MRN: [id number]  This visit was conducted in person.  BP 112/70 (BP Location: Left Arm, Patient Position: Sitting, Cuff Size: Normal)   Pulse (!) 103   Temp 97.6 F (36.4 C) (Temporal)   Ht $R'5\' 9"'BR$  (1.753 m)   Wt 192 lb 6 oz (87.3 kg)   SpO2 95%   BMI 28.41 kg/m    CC: HA Subjective:   HPI: Gary Long is a 49 y.o. male presenting on 11/28/2019 for Headache (C/o constant HA for last 2 wks.  Tried OTC migraine meds, not helpful. )   2 wk h/o HA, seen at ER 11/20/19 and note reviewed - neg COVID test. Reassuring eval including noncontrasted head CT, CXR. Treated with magnesium, IVF, compazine with benefit.  Describes strong 10/10 throbbing pain at R temporal region, constant pain over the past 2 weeks. Has continued going to work until 2 days ago. No photophobia or nausea/vomiting. + phonophobia. Activity limiting pain. Some radiation of pain down right face.  No new medicines. He's actually stopped pantoprazole.  Has tried excedrin migraine TID, ibuprofen $RemoveBefore'400mg'dLJcPujbmpaWY$ , tylenol $RemoveB'1000mg'WGKFrBro$ , naprosyn.   No skin rash. Denies watery eye or drooping eye, rhinorrhea. No fevers/chills, neck stiffness, abd pain, chest pain, dyspnea, ST, cough.  R neck pain started 2 wks prior to headache.  Denies inciting trauma/injury or fall.  Has never had headache like this previously.   No recent tick bites.  Overdue for eye exam. He's been using readers. Recommended call to schedule.  Recent trip to [place] for 1 month 08/2019.  Requests note for work.      Relevant past medical, surgical, family and social history reviewed and updated as indicated. Interim medical history since our last visit reviewed. Allergies and medications reviewed and updated. Outpatient Medications Prior to Visit  Medication Sig Dispense Refill  . Accu-Chek FastClix Lancets MISC USE AS DIRECTED TO CHECK BLOOD SUGAR ONCE DAILY 100 each 0  . ACCU-CHEK  GUIDE test strip . USE AS INSTRUCTED TO CHECK BLOOD SUGAR ONCE DAILY 50 strip 3  . atorvastatin (LIPITOR) 40 MG tablet TAKE 1 TABLET BY MOUTH EVERY DAY 90 tablet 0  . Blood Glucose Monitoring Suppl (ACCU-CHEK GUIDE) w/Device KIT 1 each by Does not apply route as needed. Use as directed to check blood sugar once daily 1 kit 0  . glimepiride (AMARYL) 2 MG tablet TAKE 1 TABLET BY MOUTH DAILY BEFORE BREAKFAST. 90 tablet 0  . metFORMIN (GLUCOPHAGE) 500 MG tablet TAKE 1 TABLET (500 MG TOTAL) BY MOUTH 2 (TWO) TIMES DAILY WITH A MEAL. 180 tablet 0  . pantoprazole (PROTONIX) 40 MG tablet Take 1 tablet (40 mg total) by mouth daily as needed (heartburn). 30 tablet 3  . triamcinolone cream (KENALOG) 0.1 % Apply topically 2 (two) times daily as needed. Max 10 days at a time 80 g 0   No facility-administered medications prior to visit.     Per HPI unless specifically indicated in ROS section below Review of Systems Objective:  BP 112/70 (BP Location: Left Arm, Patient Position: Sitting, Cuff Size: Normal)   Pulse (!) 103   Temp 97.6 F (36.4 C) (Temporal)   Ht $R'5\' 9"'Io$  (1.753 m)   Wt 192 lb 6 oz (87.3 kg)   SpO2 95%   BMI 28.41 kg/m   Wt Readings from Last 3 Encounters:  11/28/19 192 lb 6 oz (87.3 kg)  11/20/19 188 lb (85.3  kg)  09/23/19 189 lb 5 oz (85.9 kg)      Physical Exam Vitals and nursing note reviewed.  Constitutional:      Appearance: Normal appearance. He is not ill-appearing.  HENT:     Head: Normocephalic and atraumatic.     Right Ear: Tympanic membrane, ear canal and external ear normal.     Left Ear: Tympanic membrane, ear canal and external ear normal.     Nose: Nose normal.     Mouth/Throat:     Mouth: Mucous membranes are moist.     Pharynx: Oropharynx is clear. No oropharyngeal exudate or posterior oropharyngeal erythema.  Eyes:     Extraocular Movements: Extraocular movements intact.     Pupils: Pupils are equal, round, and reactive to light.     Comments: No  papilledema appreciated on limited fundoscopic exam   Neck:     Thyroid: No thyroid mass or thyromegaly.     Comments: No neck stiffness Cardiovascular:     Rate and Rhythm: Normal rate and regular rhythm.     Pulses: Normal pulses.     Heart sounds: Normal heart sounds. No murmur heard.   Pulmonary:     Effort: Pulmonary effort is normal. No respiratory distress.     Breath sounds: Normal breath sounds. No wheezing, rhonchi or rales.  Musculoskeletal:        General: Normal range of motion.     Cervical back: Normal range of motion and neck supple.     Right lower leg: No edema.     Left lower leg: No edema.  Skin:    General: Skin is warm and dry.     Findings: No rash.  Neurological:     General: No focal deficit present.     Mental Status: He is alert.     Cranial Nerves: Cranial nerves are intact.     Sensory: Sensation is intact.     Motor: Motor function is intact.     Coordination: Coordination is intact.     Gait: Gait is intact.     Comments:  CN 2-12 intact FTN intact EOMI No pronator drift  Psychiatric:        Mood and Affect: Mood normal.        Behavior: Behavior normal.       Results for orders placed or performed in visit on 11/28/19  Sedimentation rate  Result Value Ref Range   Sed Rate 12 0 - 15 mm/hr  C-reactive protein  Result Value Ref Range   CRP <1.0 0.5 - 20.0 mg/dL  Comprehensive metabolic panel  Result Value Ref Range   Sodium 138 135 - 145 mEq/L   Potassium 4.1 3.5 - 5.1 mEq/L   Chloride 103 96 - 112 mEq/L   CO2 28 19 - 32 mEq/L   Glucose, Bld 144 (H) 70 - 99 mg/dL   BUN 14 6 - 23 mg/dL   Creatinine, Ser 1.11 0.40 - 1.50 mg/dL   Total Bilirubin 0.3 0.2 - 1.2 mg/dL   Alkaline Phosphatase 76 39 - 117 U/L   AST 16 0 - 37 U/L   ALT 23 0 - 53 U/L   Total Protein 6.8 6.0 - 8.3 g/dL   Albumin 4.0 3.5 - 5.2 g/dL   GFR 78.07 >60.00 mL/min   Calcium 8.8 8.4 - 10.5 mg/dL  TSH  Result Value Ref Range   TSH 1.59 0.35 - 4.50 uIU/mL   Lab  Results  Component Value Date  HGBA1C 7.0 (H) 09/22/2019    Assessment & Plan:  This visit occurred during the SARS-CoV-2 public health emergency.  Safety protocols were in place, including screening questions prior to the visit, additional usage of staff PPE, and extensive cleaning of exam room while observing appropriate contact time as indicated for disinfecting solutions.  No significant improvement after O2 at in  Problem List Items Addressed This Visit    Right temporal headache - Primary    New severe R temporal headache present for 2 weeks.  Recent reassuring head CT at ER as well as labwork.  ?cervicogenic headache vs MOH vs hemicrania vs other - rec stop analgesics except for below.  Did not respond to 15 minutes of high flow O2 through non rebreather mask, and no autonomic symptoms - pointing against cluster headaches.  Not consistent with meningitis.  Young for GCA, but will check ESR/CRP.  Rec schedule eye exam. Will refer to neurology for further evaluation of new onset headache.  Will Rx prednisone taper for possible cervicogenic contribution, start flexeril 5-10mg  PRN headache.  Update with effect.      Relevant Medications   cyclobenzaprine (FLEXERIL) 10 MG tablet   Other Relevant Orders   Sedimentation rate (Completed)   C-reactive protein (Completed)   AMB referral to headache clinic   Comprehensive metabolic panel (Completed)   TSH (Completed)       Meds ordered this encounter  Medications  . cyclobenzaprine (FLEXERIL) 10 MG tablet    Sig: Take 0.5-1 tablets (5-10 mg total) by mouth 3 (three) times daily as needed for muscle spasms (headache).    Dispense:  30 tablet    Refill:  1  . predniSONE (DELTASONE) 20 MG tablet    Sig: Take 3 tablets (60 mg total) by mouth daily with breakfast for 3 days, THEN 2 tablets (40 mg total) daily with breakfast for 3 days, THEN 1 tablet (20 mg total) daily with breakfast for 3 days.    Dispense:  18 tablet    Refill:  0     Orders Placed This Encounter  Procedures  . Sedimentation rate  . C-reactive protein  . Comprehensive metabolic panel  . TSH  . AMB referral to headache clinic    Referral Priority:   Urgent    Referral Type:   Consultation    Number of Visits Requested:   1    Patient Instructions  Labs today  I recommend stopping tylenol, ibuprofen, excedrin migraine.  In its place may use muscle relaxant flexeril 5-10mg  up to three times daily for headache.  Take prednisone course as well. Limit sugars/carbs while on prednisone for the next 9 days.  We will refer you to headache clinic for ongoing headache.  High flow oxygen therapy didn't really help symptoms today.    Follow up plan: Return if symptoms worsen or fail to improve.  Ria Bush, MD

## 2019-11-28 NOTE — Patient Instructions (Addendum)
Labs today  I recommend stopping tylenol, ibuprofen, excedrin migraine.  In its place may use muscle relaxant flexeril 5-10mg  up to three times daily for headache.  Take prednisone course as well. Limit sugars/carbs while on prednisone for the next 9 days.  We will refer you to headache clinic for ongoing headache.  High flow oxygen therapy didn't really help symptoms today.

## 2019-11-30 ENCOUNTER — Encounter: Payer: Self-pay | Admitting: Family Medicine

## 2019-11-30 NOTE — Assessment & Plan Note (Addendum)
New severe R temporal headache present for 2 weeks.  Recent reassuring head CT at ER as well as labwork.  ?cervicogenic headache vs MOH vs hemicrania vs other - rec stop analgesics except for below.  Did not respond to 15 minutes of high flow O2 through non rebreather mask, and no autonomic symptoms - pointing against cluster headaches.  Not consistent with meningitis.  Young for GCA, but will check ESR/CRP.  Rec schedule eye exam. Will refer to neurology for further evaluation of new onset headache.  Will Rx prednisone taper for possible cervicogenic contribution, start flexeril 5-$RemoveBeforeDE'10mg'rMeLxzjfFYrJqKf$  PRN headache.  Update with effect.

## 2019-12-03 ENCOUNTER — Other Ambulatory Visit: Payer: Self-pay | Admitting: Family Medicine

## 2019-12-10 ENCOUNTER — Emergency Department
Admission: EM | Admit: 2019-12-10 | Discharge: 2019-12-10 | Disposition: A | Discharge status: Home / Self Care | Payer: BC Managed Care – PPO | Attending: Emergency Medicine | Admitting: Emergency Medicine

## 2019-12-10 ENCOUNTER — Encounter: Payer: Self-pay | Admitting: Emergency Medicine

## 2019-12-10 ENCOUNTER — Other Ambulatory Visit: Payer: Self-pay

## 2019-12-10 DIAGNOSIS — Z7984 Long term (current) use of oral hypoglycemic drugs: Secondary | ICD-10-CM | POA: Diagnosis not present

## 2019-12-10 DIAGNOSIS — E119 Type 2 diabetes mellitus without complications: Secondary | ICD-10-CM | POA: Diagnosis not present

## 2019-12-10 DIAGNOSIS — R519 Headache, unspecified: Secondary | ICD-10-CM | POA: Insufficient documentation

## 2019-12-10 DIAGNOSIS — G8929 Other chronic pain: Secondary | ICD-10-CM

## 2019-12-10 LAB — CBC
HCT: 40.8 % (ref 39.0–52.0)
Hemoglobin: 13.2 g/dL (ref 13.0–17.0)
MCH: 28.1 pg (ref 26.0–34.0)
MCHC: 32.4 g/dL (ref 30.0–36.0)
MCV: 87 fL (ref 80.0–100.0)
Platelets: 137 10*3/uL — ABNORMAL LOW (ref 150–400)
RBC: 4.69 MIL/uL (ref 4.22–5.81)
RDW: 13.5 % (ref 11.5–15.5)
WBC: 6.2 10*3/uL (ref 4.0–10.5)
nRBC: 0 % (ref 0.0–0.2)

## 2019-12-10 LAB — BASIC METABOLIC PANEL
Anion gap: 9 (ref 5–15)
BUN: 17 mg/dL (ref 6–20)
CO2: 26 mmol/L (ref 22–32)
Calcium: 8.7 mg/dL — ABNORMAL LOW (ref 8.9–10.3)
Chloride: 102 mmol/L (ref 98–111)
Creatinine, Ser: 1.06 mg/dL (ref 0.61–1.24)
GFR, Estimated: >60 mL/min (ref >60)
Glucose, Bld: 125 mg/dL — ABNORMAL HIGH (ref 70–99)
Potassium: 4.1 mmol/L (ref 3.5–5.1)
Sodium: 137 mmol/L (ref 135–145)

## 2019-12-10 MED ORDER — DIPHENHYDRAMINE HCL 50 MG/ML IJ SOLN
12.5000 mg | INTRAMUSCULAR | Status: AC
  Administered 2019-12-10: 12.5 mg via INTRAVENOUS
  Filled 2019-12-10: qty 1

## 2019-12-10 MED ORDER — MAGNESIUM SULFATE 2 GM/50ML IV SOLN
2.0000 g | Freq: Once | INTRAVENOUS | Status: AC
  Administered 2019-12-10: 2 g via INTRAVENOUS
  Filled 2019-12-10: qty 50

## 2019-12-10 MED ORDER — SODIUM CHLORIDE 0.9 % IV BOLUS
1000.0000 mL | Freq: Once | INTRAVENOUS | Status: AC
  Administered 2019-12-10: 1000 mL via INTRAVENOUS

## 2019-12-10 MED ORDER — KETOROLAC TROMETHAMINE 30 MG/ML IJ SOLN
15.0000 mg | Freq: Once | INTRAMUSCULAR | Status: AC
  Administered 2019-12-10: 15 mg via INTRAVENOUS
  Filled 2019-12-10: qty 1

## 2019-12-10 MED ORDER — DEXAMETHASONE SODIUM PHOSPHATE 10 MG/ML IJ SOLN
10.0000 mg | Freq: Once | INTRAMUSCULAR | Status: AC
  Administered 2019-12-10: 10 mg via INTRAVENOUS
  Filled 2019-12-10: qty 1

## 2019-12-10 NOTE — Discharge Instructions (Addendum)
You have been seen in the Emergency Department (ED) for a headache.  Please take the medications prescribed for you by neurology including the magnesium supplement and the nortriptyline.  Call your doctor or return to the ED if you have a worsening headache, sudden and severe headache, confusion, slurred speech, facial droop, weakness or numbness in any arm or leg, extreme fatigue, vision problems, or other symptoms that concern you.

## 2019-12-10 NOTE — ED Provider Notes (Signed)
[facility] Emergency Department Provider Note  ____________________________________________   First MD Initiated Contact with Patient 12/10/19 [phone]     (approximate)  I have reviewed the triage vital signs and the nursing notes.   HISTORY  Chief Complaint Headache    HPI Gary Long is a 49 y.o. 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  He has had intermittent but recurrent headaches for more than 3 weeks.  He has been seen in the emergency department and by his primary care doctor.  He had a follow-up appointment 2 days ago with neurology.  They started him on nortriptyline and a magnesium supplement (which he has not yet started) and have plans to follow-up with him to determine if additional imaging is needed.  He said that his pain is primarily on the right side, sharp and stabbing and throbbing all throughout the head and radiating somewhat into his face and occasionally down into the right anterior part of his neck.  He has no trouble breathing or swallowing.  He denies nausea and vomiting but said he is sensitive to bright lights and loud noises.  He occasionally snores but states that his primary care provider put a nonrebreather on him and gave him oxygen for 20+ minutes and it did not improve the pain.  He said he did better when he was on a course of steroids but once the steroids were gone he got worse again.  He has had no recent trauma.  No visual changes or deficits.  No focal weakness or numbness in his extremities.  He denies fever, neck stiffness, chest pain, cough, shortness of breath, vomiting, abdominal pain, and dysuria.         Past Medical History:  Diagnosis Date   Diabetes mellitus type 2, controlled, without complications ([place]) 58/52/7782   History of malaria 1999   HLD (hyperlipidemia) 06/13/2013   Hyperlipidemia     Patient Active Problem List   Diagnosis  Date Noted   Right temporal headache 11/28/2019   Pharyngitis, acute 10/01/2019   Pruritic rash 03/17/2019   Acute pain of left shoulder 08/08/2018   Seasonal allergic rhinitis 03/29/2018   Travel advice encounter 10/29/2017   GERD (gastroesophageal reflux disease) 10/24/2016   Complex tear of medial meniscus of right knee as current injury 06/15/2016   Unilateral primary osteoarthritis, right knee 06/15/2016   Palpitations 05/10/2016   Transaminitis 05/10/2016   Acute pain of right knee 04/20/2016   Health maintenance examination 11/27/2013   Controlled diabetes mellitus type 2 with complications ([place]) 42/35/3614   Frequent urination 11/27/2013   Premature ejaculation 11/27/2013   Lateral meniscal tear 11/27/2013   Hyperlipidemia associated with type 2 diabetes mellitus ([place]) 06/13/2013   Fatigue 12/10/2012    Past Surgical History:  Procedure Laterality Date   KNEE ARTHROSCOPY Right 2002   ?ACL repair   KNEE ARTHROSCOPY WITH DRILLING/MICROFRACTURE Right 06/21/2016   Procedure: KNEE ARTHROSCOPY WITH DRILLING/MICROFRACTURE;  Surgeon: Leandrew Koyanagi, MD;  Location: [facility];  Service: Orthopedics;  Laterality: Right;   KNEE ARTHROSCOPY WITH MEDIAL MENISECTOMY Right 06/21/2016   Procedure: RIGHT KNEE ARTHROSCOPY WITH PARTIAL MEDIAL AND LATERAL MENISCECTOMIES, SYNOVECTOMY,  MICROFRACTURE, SUBCHONDROPLASTY;  Surgeon: Leandrew Koyanagi, MD;  Location: [facility];  Service: Orthopedics;  Laterality: Right;   KNEE ARTHROSCOPY WITH SUBCHONDROPLASTY Right 06/21/2016   Procedure: KNEE ARTHROSCOPY WITH SUBCHONDROPLASTY;  Surgeon: Leandrew Koyanagi, MD;  Location: [facility];  Service: Orthopedics;  Laterality: Right;    Prior to Admission medications   Medication Sig Start Date End Date Taking? Authorizing Provider  Accu-Chek FastClix Lancets MISC USE AS DIRECTED TO CHECK BLOOD SUGAR ONCE DAILY 01/20/19   Ria Bush, MD    ACCU-CHEK GUIDE test strip . USE AS INSTRUCTED TO CHECK BLOOD SUGAR ONCE DAILY 05/07/19   Ria Bush, MD  atorvastatin (LIPITOR) 40 MG tablet TAKE 1 TABLET BY MOUTH EVERY DAY 09/19/19   Ria Bush, MD  Blood Glucose Monitoring Suppl (ACCU-CHEK GUIDE) w/Device KIT 1 each by Does not apply route as needed. Use as directed to check blood sugar once daily 07/19/17   Ria Bush, MD  cyclobenzaprine (FLEXERIL) 10 MG tablet Take 0.5-1 tablets (5-10 mg total) by mouth 3 (three) times daily as needed for muscle spasms (headache). 11/28/19   Ria Bush, MD  glimepiride (AMARYL) 2 MG tablet TAKE 1 TABLET BY MOUTH DAILY BEFORE BREAKFAST. 09/17/19   Ria Bush, MD  metFORMIN (GLUCOPHAGE) 500 MG tablet TAKE 1 TABLET (500 MG TOTAL) BY MOUTH 2 (TWO) TIMES DAILY WITH A MEAL. 12/03/19   Ria Bush, MD  pantoprazole (PROTONIX) 40 MG tablet Take 1 tablet (40 mg total) by mouth daily as needed (heartburn). 09/23/19   Ria Bush, MD  triamcinolone cream (KENALOG) 0.1 % Apply topically 2 (two) times daily as needed. Max 10 days at a time 09/23/19   Ria Bush, MD    Allergies Chloroquine, Hydrocodone, and Percocet [oxycodone-acetaminophen]  Family History  Problem Relation Age of Onset   Stroke Mother 16   Diabetes Mother 12   Hypertension Sister    Diabetes Sister    Cancer Neg Hx    CAD Neg Hx     Social History Social History   Tobacco Use   Smoking status: Never Smoker   Smokeless tobacco: Never Used  Scientific laboratory technician Use: Never used  Substance Use Topics   Alcohol use: Yes    Alcohol/week: 0.0 - 1.0 standard drinks   Drug use: No    Review of Systems Constitutional: No fever/chills Eyes: No visual changes but endorses photophobia. ENT: No sore throat. Cardiovascular: Denies chest pain. Respiratory: Denies shortness of breath. Gastrointestinal: No abdominal pain.  No nausea, no vomiting.  No diarrhea.  No  constipation. Genitourinary: Negative for dysuria. Musculoskeletal: Negative for neck pain.  Negative for back pain. Integumentary: Negative for rash. Neurological: Severe and recurrent/persistent right-sided headache that is sharp and throbbing and occasionally radiates down his face.  No focal numbness nor weakness.  ____________________________________________   PHYSICAL EXAM:  VITAL SIGNS: ED Triage Vitals [12/10/19 0206]  Enc Vitals Group     BP 139/83     Pulse Rate 80     Resp 18     Temp 98.5 F (36.9 C)     Temp src      SpO2 99 %     Weight 87.3 kg (192 lb 6 oz)     Height 1.753 m ($Remove'5\' 9"'daBPzpo$ )     Head Circumference      Peak Flow      Pain Score 10     Pain Loc      Pain Edu?      Excl. in [place]?     Constitutional: Alert and oriented.  Eyes: Conjunctivae are normal.  Pupils are equal and reactive bilaterally.  Patient endorses photophobia. Head: Atraumatic. Nose: No congestion/rhinnorhea. Mouth/Throat: Patient is wearing a mask. Neck: No stridor.  No meningeal signs.  Cardiovascular: Normal rate, regular rhythm. Good peripheral circulation. Grossly normal heart sounds. Respiratory: Normal respiratory effort.  No retractions. Gastrointestinal: Soft and nontender. No distention.  Musculoskeletal: No lower extremity tenderness nor edema. No gross deformities of extremities. Neurologic:  Normal speech and language. No gross focal neurologic deficits are appreciated.  Good grip strength and major muscle group strength in bilateral upper and lower extremities.  No facial droop, no ptosis. Skin:  Skin is warm, dry and intact. Psychiatric: Mood and affect are normal. Speech and behavior are normal.  ____________________________________________   LABS (all labs ordered are listed, but only abnormal results are displayed)  Labs Reviewed  BASIC METABOLIC PANEL - Abnormal; Notable for the following components:      Result Value   Glucose, Bld 125 (*)    Calcium 8.7 (*)     All other components within normal limits  CBC - Abnormal; Notable for the following components:   Platelets 137 (*)    All other components within normal limits   ____________________________________________  EKG  No indication for emergent EKG ____________________________________________  RADIOLOGY I, Hinda Kehr, personally viewed and evaluated these images (plain radiographs) as part of my medical decision making, as well as reviewing the written report by the radiologist.  ED MD interpretation: No indication for emergent imaging  Official radiology report(s): No results found.  ____________________________________________   PROCEDURES   Procedure(s) performed (including Critical Care):  Procedures   ____________________________________________   INITIAL IMPRESSION / MDM / [place] / ED COURSE  As part of my medical decision making, I reviewed the following data within the [facility] notes reviewed and incorporated, Old chart reviewed and Notes from prior ED visits   Differential diagnosis includes, but is not limited to, tension headache, medication overuse/rebound headache, cluster headache, migraine, neoplasm, pseudotumor cerebri.  The patient was just seen by neurology 2 days ago and they have a plan for him.  The nortriptyline does not seem to have had a significant effect yet although he has only been taking it for a day or 2.  He has no focal neurological deficits.  He was already "tested" by treating empirically with nonrebreather/oxygen for cluster headache and it did not work.  There is no indication for emergent imaging tonight.  I explained that is unlikely I will give him a specific diagnosis tonight but that we can try to treat his pain by way of treating as a migraine.  He said that steroids seem to help him in the past and that is part of my "migraine cocktail".  I will give him the following medications:  Droperidol 2.5 mg IV, Toradol 15 mg IV, Decadron 10 mg IV, magnesium 2 g IV, Benadryl 12.5 mg IV, and 1 L normal saline.  I explained that if we can improve his symptoms I anticipate discharge and outpatient follow-up and he understands and agrees with the plan.       Clinical Course as of Dec 10 751  Wed Dec 10, 2019  0751 The patient states that he feels better although his headache has not resolved.  He is comfortable with the plan to go home.  He will follow up with neurology as I recommended.  I gave my usual and customary return precautions.   [CF]    Clinical Course User Index [CF] Hinda Kehr, MD     ____________________________________________  FINAL CLINICAL IMPRESSION(S) / ED DIAGNOSES  Final diagnoses:  Chronic intractable headache, unspecified headache type  MEDICATIONS GIVEN DURING THIS VISIT:  Medications  magnesium sulfate IVPB 2 g 50 mL (0 g Intravenous Stopped 12/10/19 0735)  sodium chloride 0.9 % bolus 1,000 mL (0 mLs Intravenous Stopped 12/10/19 0736)  ketorolac (TORADOL) 30 MG/ML injection 15 mg (15 mg Intravenous Given 12/10/19 0624)  dexamethasone (DECADRON) injection 10 mg (10 mg Intravenous Given 12/10/19 0624)  diphenhydrAMINE (BENADRYL) injection 12.5 mg (12.5 mg Intravenous Given 12/10/19 7209)     ED Discharge Orders    None      *Please note:  Pinchus C Bottenfield was evaluated in Emergency Department on 12/10/2019 for the symptoms described in the history of present illness. He was evaluated in the context of the global COVID-19 pandemic, which necessitated consideration that the patient might be at risk for infection with the SARS-CoV-2 virus that causes COVID-19. Institutional protocols and algorithms that pertain to the evaluation of patients at risk for COVID-19 are in a state of rapid change based on information released by regulatory bodies including the CDC and federal and state organizations. These policies and algorithms were followed  during the patient's care in the ED.  Some ED evaluations and interventions may be delayed as a result of limited staffing during and after the pandemic.*  Note:  This document was prepared using Dragon voice recognition software and may include unintentional dictation errors.   Hinda Kehr, MD 12/10/19 [phone]

## 2019-12-10 NOTE — ED Triage Notes (Signed)
Pt arrived via POV with spouse, reports ongoing R side HA for the past month, pt has been seen in ED and saw neuro on 11/29.  Pt had head CT on 11/11  Pt continues to rate HA 10/10. No neuro deficits noted at this time.  Reports lightsensitivity, pt very soft spoken in triage.

## 2019-12-20 ENCOUNTER — Encounter: Payer: Self-pay | Admitting: Family Medicine

## 2019-12-22 ENCOUNTER — Other Ambulatory Visit: Payer: Self-pay | Admitting: Family Medicine

## 2020-01-27 ENCOUNTER — Other Ambulatory Visit: Payer: Self-pay | Admitting: Family Medicine

## 2020-02-03 ENCOUNTER — Telehealth: Payer: Self-pay | Admitting: Family Medicine

## 2020-02-03 MED ORDER — ACCU-CHEK GUIDE VI STRP: ORAL_STRIP | 3 refills | Status: DC

## 2020-02-03 NOTE — Telephone Encounter (Signed)
Is in need of a refill on test strips

## 2020-02-03 NOTE — Telephone Encounter (Signed)
E-scribed refill 

## 2020-02-19 ENCOUNTER — Other Ambulatory Visit: Payer: Self-pay | Admitting: Family Medicine

## 2020-04-16 ENCOUNTER — Other Ambulatory Visit: Payer: Self-pay | Admitting: Family Medicine

## 2020-04-16 NOTE — Telephone Encounter (Signed)
Per pt, stopped hydroxyzine per 10/01/19 OV notes.  Spoke with pt asking if he restarted hydroxyzine.  Pt confirms he did due to itching.   Hydroxyzine last rx:  06/17/19, #30 Triamcinolone cream last filled:  09/23/19, #80 g Last OV:  11/28/19, HA Next OV:  none

## 2020-04-16 NOTE — Telephone Encounter (Signed)
ERx 

## 2020-05-18 ENCOUNTER — Encounter: Payer: Self-pay | Admitting: Family Medicine

## 2020-05-28 ENCOUNTER — Other Ambulatory Visit: Payer: Self-pay

## 2020-05-28 ENCOUNTER — Encounter: Payer: Self-pay | Admitting: Family Medicine

## 2020-05-28 ENCOUNTER — Ambulatory Visit: Payer: BC Managed Care – PPO | Admitting: Family Medicine

## 2020-05-28 ENCOUNTER — Ambulatory Visit (INDEPENDENT_AMBULATORY_CARE_PROVIDER_SITE_OTHER): Payer: 59 | Admitting: Family Medicine

## 2020-05-28 VITALS — BP 126/82 | HR 81 | Temp 97.9°F | Ht 69.0 in | Wt 192.0 lb

## 2020-05-28 DIAGNOSIS — N529 Male erectile dysfunction, unspecified: Secondary | ICD-10-CM | POA: Diagnosis not present

## 2020-05-28 DIAGNOSIS — E118 Type 2 diabetes mellitus with unspecified complications: Secondary | ICD-10-CM

## 2020-05-28 DIAGNOSIS — R519 Headache, unspecified: Secondary | ICD-10-CM | POA: Diagnosis not present

## 2020-05-28 LAB — POCT GLYCOSYLATED HEMOGLOBIN (HGB A1C): Hemoglobin A1C: 7.2 % — AB (ref 4.0–5.6)

## 2020-05-28 MED ORDER — SILDENAFIL CITRATE 50 MG PO TABS: 50.0000 mg | ORAL_TABLET | Freq: Every day | ORAL | 0 refills | Status: DC | PRN

## 2020-05-28 MED ORDER — DAPAGLIFLOZIN PROPANEDIOL 5 MG PO TABS: 5.0000 mg | ORAL_TABLET | Freq: Every day | ORAL | 11 refills | Status: DC

## 2020-05-28 MED ORDER — PANTOPRAZOLE SODIUM 40 MG PO TBEC: 40.0000 mg | DELAYED_RELEASE_TABLET | Freq: Every day | ORAL | 3 refills | Status: DC | PRN

## 2020-05-28 NOTE — Assessment & Plan Note (Signed)
Reviewed common etiologies including organic and med related.  Trial viagra. Pt aware to watch for HA, priapism, chest pain, avoid any nitrates while on it.

## 2020-05-28 NOTE — Assessment & Plan Note (Signed)
Chronic, above ideal levels on current regimen. Reviewed other treatment options including GLP1RA and SGLT2 including mechanisms of action and side effects/adverse effects. He is interested in retrial of SGLT2 - will price out farxiga to take in place of amaryl. Will continue metformin at this time.

## 2020-05-28 NOTE — Patient Instructions (Addendum)
I will send in farxiga to start in place of glimepiride (amaryl)  Call insurance to check on cost/coverage for Farxiga (dapagliflozin), Rybelsus (daily pill) or Trulicity or Ozempic (weekly shots).  Continue metformin 500mg  twice daily.  Keep eye appointment in 08/2020 Return in 4 months for physical.

## 2020-05-28 NOTE — Assessment & Plan Note (Signed)
Overall improved.  Now off effexor, pamelor.

## 2020-05-28 NOTE — Progress Notes (Signed)
Patient ID: Gary Long, male    DOB: 10/12/1970, 50 y.o.   MRN: [id number]  This visit was conducted in person.  BP 126/82   Pulse 81   Temp 97.9 F (36.6 C) (Temporal)   Ht 5' 9" (1.753 m)   Wt 192 lb (87.1 kg)   SpO2 93%   BMI 28.35 kg/m    CC: DM f/u visit  Subjective:   HPI: Gary Long is a 50 y.o. male presenting on 05/28/2020 for Diabetes (Here for f/u.)   Ongoing headache saw neurology Melrose Nakayama) - started on effexor 8m bid along with nortriptyline (Pamelor) 559mnightly. Bad muscle spasms and ED to Effexor. Currently off all preventative meds.   Notes some ongoing trouble obtaining and maintaining erection.   DM - does regularly check sugars fasting: 120-140, highest 160. Compliant with antihyperglycemic regimen which includes: glimepiride 11m711maily (some missed doses), metformin 500m811md. jardiance unaffordable. Rare low sugars to 60. Denies paresthesias. Last diabetic eye exam DUE. Pneumovax: 2014. Prevnar: not due. Glucometer brand: accuchek. DSME: 1 class 2019, didn't complete.  Lab Results  Component Value Date   HGBA1C 7.2 (A) 05/28/2020   Diabetic Foot Exam - Simple   No data filed    Lab Results  Component Value Date   MICROALBUR <0.7 09/22/2019        Relevant past medical, surgical, family and social history reviewed and updated as indicated. Interim medical history since our last visit reviewed. Allergies and medications reviewed and updated. Outpatient Medications Prior to Visit  Medication Sig Dispense Refill  . Accu-Chek FastClix Lancets MISC USE AS DIRECTED TO CHECK BLOOD SUGAR ONCE DAILY 100 each 0  . atorvastatin (LIPITOR) 40 MG tablet TAKE 1 TABLET BY MOUTH EVERY DAY 90 tablet 3  . Blood Glucose Monitoring Suppl (ACCU-CHEK GUIDE) w/Device KIT 1 each by Does not apply route as needed. Use as directed to check blood sugar once daily 1 kit 0  . cyclobenzaprine (FLEXERIL) 10 MG tablet Take 0.5-1 tablets (5-10 mg total) by  mouth 3 (three) times daily as needed for muscle spasms (headache). 30 tablet 1  . glimepiride (AMARYL) 2 MG tablet TAKE 1 TABLET BY MOUTH DAILY BEFORE BREAKFAST. 90 tablet 2  . glucose blood (ACCU-CHEK GUIDE) test strip USE AS INSTRUCTED TO CHECK BLOOD SUGAR ONCE DAILY 50 strip 3  . hydrOXYzine (ATARAX/VISTARIL) 25 MG tablet TAKE 1/2 TO 1 TABLET BY MOUTH 3 TIMES A DAY AS NEEDED FOR ITCHING (SEDATION PRECAUTIONS) 30 tablet 0  . metFORMIN (GLUCOPHAGE) 500 MG tablet TAKE 1 TABLET (500 MG TOTAL) BY MOUTH 2 (TWO) TIMES DAILY WITH A MEAL. 180 tablet 3  . triamcinolone cream (KENALOG) 0.1 % APPLY TOPICALLY 2 (TWO) TIMES DAILY AS NEEDED. MAX 10 DAYS AT A TIME 80 g 0  . pantoprazole (PROTONIX) 40 MG tablet Take 1 tablet (40 mg total) by mouth daily as needed (heartburn). 30 tablet 3   No facility-administered medications prior to visit.     Per HPI unless specifically indicated in ROS section below Review of Systems Objective:  BP 126/82   Pulse 81   Temp 97.9 F (36.6 C) (Temporal)   Ht 5' 9" (1.753 m)   Wt 192 lb (87.1 kg)   SpO2 93%   BMI 28.35 kg/m   Wt Readings from Last 3 Encounters:  05/28/20 192 lb (87.1 kg)  12/10/19 192 lb 6 oz (87.3 kg)  11/28/19 192 lb 6 oz (87.3 kg)  Physical Exam Vitals and nursing note reviewed.  Constitutional:      General: He is not in acute distress.    Appearance: Normal appearance. He is well-developed. He is not ill-appearing.  HENT:     Head: Normocephalic and atraumatic.  Eyes:     General: No scleral icterus.    Extraocular Movements: Extraocular movements intact.     Conjunctiva/sclera: Conjunctivae normal.     Pupils: Pupils are equal, round, and reactive to light.  Cardiovascular:     Rate and Rhythm: Normal rate and regular rhythm.     Pulses: Normal pulses.     Heart sounds: Normal heart sounds. No murmur heard.   Pulmonary:     Effort: Pulmonary effort is normal. No respiratory distress.     Breath sounds: Normal breath  sounds. No wheezing, rhonchi or rales.  Musculoskeletal:     Cervical back: Normal range of motion and neck supple.     Right lower leg: No edema.     Left lower leg: No edema.     Comments: See HPI for foot exam if done  Lymphadenopathy:     Cervical: No cervical adenopathy.  Skin:    General: Skin is warm and dry.     Findings: No rash.  Neurological:     Mental Status: He is alert.  Psychiatric:        Mood and Affect: Mood normal.        Behavior: Behavior normal.       Results for orders placed or performed in visit on 05/28/20  POCT glycosylated hemoglobin (Hb A1C)  Result Value Ref Range   Hemoglobin A1C 7.2 (A) 4.0 - 5.6 %   HbA1c POC (<> result, manual entry)     HbA1c, POC (prediabetic range)     HbA1c, POC (controlled diabetic range)     Assessment & Plan:  This visit occurred during the SARS-CoV-2 public health emergency.  Safety protocols were in place, including screening questions prior to the visit, additional usage of staff PPE, and extensive cleaning of exam room while observing appropriate contact time as indicated for disinfecting solutions.   Problem List Items Addressed This Visit    Controlled diabetes mellitus type 2 with complications ([place]) - Primary    Chronic, above ideal levels on current regimen. Reviewed other treatment options including GLP1RA and SGLT2 including mechanisms of action and side effects/adverse effects. He is interested in retrial of SGLT2 - will price out farxiga to take in place of amaryl. Will continue metformin at this time.       Relevant Medications   dapagliflozin propanediol (FARXIGA) 5 MG TABS tablet   Other Relevant Orders   POCT glycosylated hemoglobin (Hb A1C) (Completed)   Right temporal headache    Overall improved.  Now off effexor, pamelor.       ED (erectile dysfunction)    Reviewed common etiologies including organic and med related.  Trial viagra. Pt aware to watch for HA, priapism, chest pain, avoid any  nitrates while on it.           Meds ordered this encounter  Medications  . pantoprazole (PROTONIX) 40 MG tablet    Sig: Take 1 tablet (40 mg total) by mouth daily as needed (heartburn).    Dispense:  30 tablet    Refill:  3  . dapagliflozin propanediol (FARXIGA) 5 MG TABS tablet    Sig: Take 1 tablet (5 mg total) by mouth daily before breakfast.  Dispense:  30 tablet    Refill:  11  . sildenafil (VIAGRA) 50 MG tablet    Sig: Take 1 tablet (50 mg total) by mouth daily as needed for erectile dysfunction.    Dispense:  10 tablet    Refill:  0   Orders Placed This Encounter  Procedures  . POCT glycosylated hemoglobin (Hb A1C)    Patient Instructions  I will send in [place] to start in place of glimepiride (amaryl)  Call insurance to check on cost/coverage for Farxiga (dapagliflozin), Rybelsus (daily pill) or Trulicity or Ozempic (weekly shots).  Continue metformin 553m twice daily.  Keep eye appointment in 08/2020 Return in 4 months for physical.    Follow up plan: Return in about 4 months (around 09/28/2020) for annual exam, prior fasting for blood work.  JRia Bush MD

## 2020-06-03 ENCOUNTER — Telehealth: Payer: Self-pay

## 2020-06-03 NOTE — Telephone Encounter (Signed)
Received faxed PA form from [employer] for sildenafil citrate 50 mg tab.  Placed form in Dr. Timoteo Expose box.

## 2020-06-04 NOTE — Telephone Encounter (Signed)
Faxed PA.  Decision pending.  

## 2020-06-04 NOTE — Telephone Encounter (Signed)
Filled and in Lisa's box 

## 2020-06-09 NOTE — Telephone Encounter (Signed)
Received faxed PA form for additional info.    Form completed and faxed today to [phone].  Decision pending.

## 2020-06-10 NOTE — Telephone Encounter (Signed)
Received faxed PA approval, valid 06/09/2020- 06/08/2021.  Made pharmacy aware.

## 2020-07-19 ENCOUNTER — Other Ambulatory Visit: Payer: Self-pay | Admitting: Family Medicine

## 2020-08-18 ENCOUNTER — Telehealth: Payer: Self-pay

## 2020-08-18 NOTE — Telephone Encounter (Signed)
Pt dropped off health form from [employer] to be completed.  Last CPE, 09/23/19.  Placed form in Dr. Timoteo Expose box.

## 2020-08-18 NOTE — Telephone Encounter (Addendum)
Signed and in Lisa's box. Looks like pt will need to come pick up.  He hasn't signed form yet.

## 2020-08-19 NOTE — Telephone Encounter (Signed)
Lvm asking pt to call back.  Need to notify pt his form is ready to pick up.  However, he needs to sign and date it before turning it in.  [Placed form at front office- yellow folders.] 

## 2020-08-20 NOTE — Telephone Encounter (Signed)
Lvm asking pt to call back.  Need to notify pt his form is ready to pick up.  However, he needs to sign and date it before turning it in.  [Placed form at front office- yellow folders.]

## 2020-08-25 NOTE — Telephone Encounter (Signed)
Looks like form has been picked up.

## 2020-09-06 LAB — HM DIABETES EYE EXAM

## 2020-09-08 ENCOUNTER — Encounter: Payer: Self-pay | Admitting: Family Medicine

## 2020-09-09 ENCOUNTER — Telehealth: Payer: Self-pay | Admitting: Family Medicine

## 2020-09-09 NOTE — Telephone Encounter (Signed)
Placed form in Dr. G's box.  

## 2020-09-09 NOTE — Telephone Encounter (Signed)
Pt brought paperwork for Dr. Reece Agar to look at (tbuck)  Type of forms received:statement of goodhealth  Routed [id number]  Paperwork received by : Merrie Roof   Individual made aware of 3-5 business day turn around (Y/N):y  Form completed and patient made aware of charges(Y/N): y  Faxed to :   Form location:  put in Dr Reece Agar. box

## 2020-09-10 NOTE — Telephone Encounter (Signed)
Filled and in LIsa's box.  

## 2020-09-10 NOTE — Telephone Encounter (Signed)
Spoke with pt notifying him the form is ready to pick up.    [Placed form at front office- yellow folders.] 

## 2020-09-24 ENCOUNTER — Other Ambulatory Visit: Payer: Self-pay

## 2020-09-24 ENCOUNTER — Ambulatory Visit (INDEPENDENT_AMBULATORY_CARE_PROVIDER_SITE_OTHER): Payer: 59

## 2020-09-24 DIAGNOSIS — Z23 Encounter for immunization: Secondary | ICD-10-CM

## 2020-10-08 ENCOUNTER — Ambulatory Visit: Payer: 59 | Admitting: Family Medicine

## 2020-10-16 ENCOUNTER — Emergency Department
Admission: EM | Admit: 2020-10-16 | Discharge: 2020-10-16 | Disposition: A | Discharge status: Home / Self Care | Payer: 59 | Attending: Emergency Medicine | Admitting: Emergency Medicine

## 2020-10-16 ENCOUNTER — Emergency Department: Payer: 59

## 2020-10-16 ENCOUNTER — Other Ambulatory Visit: Payer: Self-pay

## 2020-10-16 DIAGNOSIS — R0781 Pleurodynia: Secondary | ICD-10-CM | POA: Diagnosis not present

## 2020-10-16 DIAGNOSIS — Z7984 Long term (current) use of oral hypoglycemic drugs: Secondary | ICD-10-CM | POA: Diagnosis not present

## 2020-10-16 DIAGNOSIS — M546 Pain in thoracic spine: Secondary | ICD-10-CM | POA: Insufficient documentation

## 2020-10-16 DIAGNOSIS — R791 Abnormal coagulation profile: Secondary | ICD-10-CM | POA: Diagnosis not present

## 2020-10-16 DIAGNOSIS — E119 Type 2 diabetes mellitus without complications: Secondary | ICD-10-CM | POA: Insufficient documentation

## 2020-10-16 DIAGNOSIS — R7989 Other specified abnormal findings of blood chemistry: Secondary | ICD-10-CM

## 2020-10-16 DIAGNOSIS — I10 Essential (primary) hypertension: Secondary | ICD-10-CM | POA: Insufficient documentation

## 2020-10-16 LAB — D-DIMER, QUANTITATIVE: D-Dimer, Quant: 0.62 ug/mL-FEU — ABNORMAL HIGH (ref 0.00–0.50)

## 2020-10-16 LAB — COMPREHENSIVE METABOLIC PANEL
ALT: 26 U/L (ref 0–44)
AST: 21 U/L (ref 15–41)
Albumin: 3.4 g/dL — ABNORMAL LOW (ref 3.5–5.0)
Alkaline Phosphatase: 68 U/L (ref 38–126)
Anion gap: 5 (ref 5–15)
BUN: 14 mg/dL (ref 6–20)
CO2: 26 mmol/L (ref 22–32)
Calcium: 8.9 mg/dL (ref 8.9–10.3)
Chloride: 103 mmol/L (ref 98–111)
Creatinine, Ser: 1.04 mg/dL (ref 0.61–1.24)
GFR, Estimated: >60 mL/min (ref >60)
Glucose, Bld: 117 mg/dL — ABNORMAL HIGH (ref 70–99)
Potassium: 3.9 mmol/L (ref 3.5–5.1)
Sodium: 134 mmol/L — ABNORMAL LOW (ref 135–145)
Total Bilirubin: 0.7 mg/dL (ref 0.3–1.2)
Total Protein: 6.5 g/dL (ref 6.5–8.1)

## 2020-10-16 LAB — CBC WITH DIFFERENTIAL/PLATELET
Abs Immature Granulocytes: 0.01 10*3/uL (ref 0.00–0.07)
Basophils Absolute: 0 10*3/uL (ref 0.0–0.1)
Basophils Relative: 1 %
Eosinophils Absolute: 0.1 10*3/uL (ref 0.0–0.5)
Eosinophils Relative: 2 %
HCT: 42.6 % (ref 39.0–52.0)
Hemoglobin: 14 g/dL (ref 13.0–17.0)
Immature Granulocytes: 0 %
Lymphocytes Relative: 34 %
Lymphs Abs: 1.9 10*3/uL (ref 0.7–4.0)
MCH: 28.2 pg (ref 26.0–34.0)
MCHC: 32.9 g/dL (ref 30.0–36.0)
MCV: 85.7 fL (ref 80.0–100.0)
Monocytes Absolute: 0.6 10*3/uL (ref 0.1–1.0)
Monocytes Relative: 10 %
Neutro Abs: 3.1 10*3/uL (ref 1.7–7.7)
Neutrophils Relative %: 53 %
Platelets: 130 10*3/uL — ABNORMAL LOW (ref 150–400)
RBC: 4.97 MIL/uL (ref 4.22–5.81)
RDW: 13.2 % (ref 11.5–15.5)
WBC: 5.7 10*3/uL (ref 4.0–10.5)
nRBC: 0 % (ref 0.0–0.2)

## 2020-10-16 LAB — TROPONIN I (HIGH SENSITIVITY): Troponin I (High Sensitivity): 3 ng/L (ref <18)

## 2020-10-16 MED ORDER — IOHEXOL 350 MG/ML SOLN
75.0000 mL | Freq: Once | INTRAVENOUS | Status: AC | PRN
  Administered 2020-10-16: 75 mL via INTRAVENOUS
  Filled 2020-10-16: qty 75

## 2020-10-16 MED ORDER — LIDOCAINE 5 % EX PTCH: 1.0000 | MEDICATED_PATCH | Freq: Two times a day (BID) | CUTANEOUS | 0 refills | Status: AC

## 2020-10-16 NOTE — ED Notes (Signed)
Pt educated of instructions noted on d/c paper work and had no further questions for the MD or this RN. Pt unable to sign E-sig for d/c paperwork due to pad malfunction. Pt verbalized an understanding of instructions presented today. Pt left ED with all personal items removed from the bedside/room. Pt A&Ox4 and ambulatory independently upon d/c

## 2020-10-16 NOTE — Discharge Instructions (Addendum)
As we discussed your ct scan did show a pulmonary nodule. This should be followed up with your primary care. We have also sent your information to our pulmonary nodule clinic. Please seek medical attention for any high fevers, chest pain, shortness of breath, change in behavior, persistent vomiting, bloody stool or any other new or concerning symptoms.

## 2020-10-16 NOTE — ED Triage Notes (Signed)
Pt states he has had upper back pain for the last week- pt denies recent injuries or falls

## 2020-10-16 NOTE — ED Provider Notes (Signed)
[facility] Emergency Department Provider Note  ____________________________________________   Event Date/Time   First MD Initiated Contact with Patient 10/16/20 1724     (approximate)  I have reviewed the triage vital signs and the nursing notes.   HISTORY  Chief Complaint Back Pain   HPI Renard C Forcier is a 50 y.o. male with a past medical history of DM, HDL, DM and malaria as well as GERD who presents for assessment approximately 1 week of some right-sided mid upper back pain.  Patient states it is worse when he takes a deep breath and sometimes feel like it is rating to the right chest.  He denies any significant cough, left-sided chest pain, headache, earache, sore throat, other back pain, rash, recent falls or injuries, abdominal pain, diarrhea, constipation, burning with urination or any extremity pain.  No prior similar episodes.  He states he took an ibuprofen and some Flexeril but this did not significant help.  No other acute concerns at this time.         Past Medical History:  Diagnosis Date   Diabetes mellitus type 2, controlled, without complications ([place]) 41/74/0814   History of malaria 1999   HLD (hyperlipidemia) 06/13/2013   Hyperlipidemia     Patient Active Problem List   Diagnosis Date Noted   ED (erectile dysfunction) 05/28/2020   Right temporal headache 11/28/2019   Pharyngitis, acute 10/01/2019   Pruritic rash 03/17/2019   Acute pain of left shoulder 08/08/2018   Seasonal allergic rhinitis 03/29/2018   Travel advice encounter 10/29/2017   GERD (gastroesophageal reflux disease) 10/24/2016   Complex tear of medial meniscus of right knee as current injury 06/15/2016   Unilateral primary osteoarthritis, right knee 06/15/2016   Palpitations 05/10/2016   Transaminitis 05/10/2016   Acute pain of right knee 04/20/2016   Health maintenance examination 11/27/2013   Controlled diabetes mellitus type 2 with complications ([place])  48/18/5631   Frequent urination 11/27/2013   Premature ejaculation 11/27/2013   Lateral meniscal tear 11/27/2013   Hyperlipidemia associated with type 2 diabetes mellitus ([place]) 06/13/2013   Fatigue 12/10/2012    Past Surgical History:  Procedure Laterality Date   KNEE ARTHROSCOPY Right 2002   ?ACL repair   KNEE ARTHROSCOPY WITH DRILLING/MICROFRACTURE Right 06/21/2016   Procedure: KNEE ARTHROSCOPY WITH DRILLING/MICROFRACTURE;  Surgeon: Leandrew Koyanagi, MD;  Location: [place];  Service: Orthopedics;  Laterality: Right;   KNEE ARTHROSCOPY WITH MEDIAL MENISECTOMY Right 06/21/2016   Procedure: RIGHT KNEE ARTHROSCOPY WITH PARTIAL MEDIAL AND LATERAL MENISCECTOMIES, SYNOVECTOMY,  MICROFRACTURE, SUBCHONDROPLASTY;  Surgeon: Leandrew Koyanagi, MD;  Location: [place];  Service: Orthopedics;  Laterality: Right;   KNEE ARTHROSCOPY WITH SUBCHONDROPLASTY Right 06/21/2016   Procedure: KNEE ARTHROSCOPY WITH SUBCHONDROPLASTY;  Surgeon: Leandrew Koyanagi, MD;  Location: [place];  Service: Orthopedics;  Laterality: Right;    Prior to Admission medications   Medication Sig Start Date End Date Taking? Authorizing Provider  Accu-Chek FastClix Lancets MISC USE AS DIRECTED TO CHECK BLOOD SUGAR ONCE DAILY 01/20/19   Ria Bush, MD  atorvastatin (LIPITOR) 40 MG tablet TAKE 1 TABLET BY MOUTH EVERY DAY 12/22/19   Ria Bush, MD  Blood Glucose Monitoring Suppl (ACCU-CHEK GUIDE) w/Device KIT 1 each by Does not apply route as needed. Use as directed to check blood sugar once daily 07/19/17   Ria Bush, MD  cyclobenzaprine (FLEXERIL) 10 MG tablet Take 0.5-1 tablets (5-10 mg total) by mouth 3 (three) times daily as  needed for muscle spasms (headache). 11/28/19   Ria Bush, MD  FARXIGA 5 MG TABS tablet TAKE 1 TABLET BY MOUTH DAILY BEFORE BREAKFAST. 07/20/20   Ria Bush, MD  glimepiride (AMARYL) 2 MG tablet TAKE 1 TABLET BY MOUTH DAILY BEFORE  BREAKFAST. 02/20/20   Ria Bush, MD  glucose blood (ACCU-CHEK GUIDE) test strip USE AS INSTRUCTED TO CHECK BLOOD SUGAR ONCE DAILY 02/03/20   Ria Bush, MD  hydrOXYzine (ATARAX/VISTARIL) 25 MG tablet TAKE 1/2 TO 1 TABLET BY MOUTH 3 TIMES A DAY AS NEEDED FOR ITCHING (SEDATION PRECAUTIONS) 04/16/20   Ria Bush, MD  metFORMIN (GLUCOPHAGE) 500 MG tablet TAKE 1 TABLET (500 MG TOTAL) BY MOUTH 2 (TWO) TIMES DAILY WITH A MEAL. 12/03/19   Ria Bush, MD  pantoprazole (PROTONIX) 40 MG tablet Take 1 tablet (40 mg total) by mouth daily as needed (heartburn). 05/28/20   Ria Bush, MD  sildenafil (VIAGRA) 50 MG tablet Take 1 tablet (50 mg total) by mouth daily as needed for erectile dysfunction. 05/28/20   Ria Bush, MD  triamcinolone cream (KENALOG) 0.1 % APPLY TOPICALLY 2 (TWO) TIMES DAILY AS NEEDED. MAX 10 DAYS AT A TIME 04/16/20   Ria Bush, MD    Allergies Chloroquine, Effexor [venlafaxine], Hydrocodone, and Percocet [oxycodone-acetaminophen]  Family History  Problem Relation Age of Onset   Stroke Mother 77   Diabetes Mother 55   Hypertension Sister    Diabetes Sister    Cancer Neg Hx    CAD Neg Hx     Social History Social History   Tobacco Use   Smoking status: Never   Smokeless tobacco: Never  Vaping Use   Vaping Use: Never used  Substance Use Topics   Alcohol use: Yes    Alcohol/week: 0.0 - 1.0 standard drinks   Drug use: No    Review of Systems  Review of Systems  Constitutional:  Negative for chills and fever.  HENT:  Negative for sore throat.   Eyes:  Negative for pain.  Respiratory:  Negative for cough and stridor.   Cardiovascular:  Positive for chest pain.  Gastrointestinal:  Negative for vomiting.  Musculoskeletal:  Positive for back pain (R mid back).  Skin:  Negative for rash.  Neurological:  Negative for seizures, loss of consciousness and headaches.  Psychiatric/Behavioral:  Negative for suicidal ideas.   All other  systems reviewed and are negative.    ____________________________________________   PHYSICAL EXAM:  VITAL SIGNS: ED Triage Vitals  Enc Vitals Group     BP 10/16/20 1654 (!) 136/96     Pulse Rate 10/16/20 1654 73     Resp 10/16/20 1654 18     Temp 10/16/20 1654 98.5 F (36.9 C)     Temp Source 10/16/20 1654 Oral     SpO2 10/16/20 1654 99 %     Weight 10/16/20 1655 187 lb (84.8 kg)     Height 10/16/20 1655 _0  (1.753 m)     Head Circumference --      Peak Flow --      Pain Score 10/16/20 1655 9     Pain Loc --      Pain Edu? --      Excl. in [place]? --    Vitals:   10/16/20 1654  BP: (!) 136/96  Pulse: 73  Resp: 18  Temp: 98.5 F (36.9 C)  SpO2: 99%   Physical Exam Vitals and nursing note reviewed.  Constitutional:      Appearance: He is well-developed.  HENT:     Head: Normocephalic and atraumatic.     Right Ear: External ear normal.     Left Ear: External ear normal.     Nose: Nose normal.  Eyes:     Conjunctiva/sclera: Conjunctivae normal.  Cardiovascular:     Rate and Rhythm: Normal rate and regular rhythm.     Heart sounds: No murmur heard. Pulmonary:     Effort: Pulmonary effort is normal. No respiratory distress.     Breath sounds: Normal breath sounds.  Abdominal:     Palpations: Abdomen is soft.     Tenderness: There is no abdominal tenderness. There is no right CVA tenderness or left CVA tenderness.  Musculoskeletal:     Cervical back: Neck supple.  Skin:    General: Skin is warm and dry.  Neurological:     Mental Status: He is alert and oriented to person, place, and time.  Psychiatric:        Mood and Affect: Mood normal.    No significant tenderness over the bilateral CVA area or right mid thoracic area.  No midline tenderness.  No overlying skin changes. ____________________________________________   LABS (all labs ordered are listed, but only abnormal results are displayed)  Labs Reviewed  D-DIMER, QUANTITATIVE - Abnormal; Notable  for the following components:      Result Value   D-Dimer, Quant 0.62 (*)    All other components within normal limits  COMPREHENSIVE METABOLIC PANEL - Abnormal; Notable for the following components:   Sodium 134 (*)    Glucose, Bld 117 (*)    Albumin 3.4 (*)    All other components within normal limits  CBC WITH DIFFERENTIAL/PLATELET - Abnormal; Notable for the following components:   Platelets 130 (*)    All other components within normal limits  TROPONIN I (HIGH SENSITIVITY)   ____________________________________________  EKG  ECG shows sinus rhythm with a ventricular rate of 62, normal axis, unremarkable intervals without clear evidence of acute ischemia or significant arrhythmia. ____________________________________________  RADIOLOGY  ED MD interpretation:    Official radiology report(s): No results found.  ____________________________________________   PROCEDURES  Procedure(s) performed (including Critical Care):  Procedures   ____________________________________________   INITIAL IMPRESSION / ASSESSMENT AND PLAN / ED COURSE      Patient presents with above-stated history exam for assessment approximately 1 week of some right-sided upper back pain described as pleuritic and radiating to the right chest.  On arrival he is afebrile and hemodynamically stable.  Exam as above.  No overlying back skin changes or midline tenderness.  No significant abdominal tenderness.  Differential includes PE, pneumonia, symptomatic effusion, pleurisy, bronchitis and possible MSK.  ECG and nonelevated troponin are not suggestive of atypical presentation for ACS.  CMP shows no significant electrolyte or metabolic derangements.  CBC shows no leukocytosis or acute anemia.  D-dimer is elevated 0.62.  CTA chest obtained to assess for possible PE versus pneumonia or effusion.  Care patient signed over to assuming provider at approximately 8 PM with plan to follow-up CTA chest and if  negative likely discharge with outpatient follow-up.       ____________________________________________   FINAL CLINICAL IMPRESSION(S) / ED DIAGNOSES  Final diagnoses:  Acute right-sided thoracic back pain  Positive D dimer    Medications  iohexol (OMNIPAQUE) 350 MG/ML injection 75 mL (75 mLs Intravenous Contrast Given 10/16/20 1932)     ED Discharge Orders     None        Note:  This document was prepared using Dragon voice recognition software and may include unintentional dictation errors.    Lucrezia Starch, MD 10/17/20 1053

## 2020-10-22 ENCOUNTER — Encounter: Payer: Self-pay | Admitting: Family Medicine

## 2020-10-22 ENCOUNTER — Other Ambulatory Visit: Payer: Self-pay

## 2020-10-22 ENCOUNTER — Ambulatory Visit (INDEPENDENT_AMBULATORY_CARE_PROVIDER_SITE_OTHER): Payer: 59 | Admitting: Family Medicine

## 2020-10-22 VITALS — BP 120/80 | HR 72 | Temp 97.4°F | Ht 69.0 in | Wt 190.1 lb

## 2020-10-22 DIAGNOSIS — R911 Solitary pulmonary nodule: Secondary | ICD-10-CM | POA: Diagnosis not present

## 2020-10-22 DIAGNOSIS — R109 Unspecified abdominal pain: Secondary | ICD-10-CM | POA: Diagnosis not present

## 2020-10-22 DIAGNOSIS — M546 Pain in thoracic spine: Secondary | ICD-10-CM | POA: Diagnosis not present

## 2020-10-22 LAB — POC URINALSYSI DIPSTICK (AUTOMATED)
Bilirubin, UA: NEGATIVE
Blood, UA: NEGATIVE
Glucose, UA: NEGATIVE
Ketones, UA: NEGATIVE
Leukocytes, UA: NEGATIVE
Nitrite, UA: NEGATIVE
Protein, UA: NEGATIVE
Spec Grav, UA: 1.015 (ref 1.010–1.025)
Urobilinogen, UA: 0.2 E.U./dL
pH, UA: 6 (ref 5.0–8.0)

## 2020-10-22 MED ORDER — METHOCARBAMOL 500 MG PO TABS: 500.0000 mg | ORAL_TABLET | Freq: Three times a day (TID) | ORAL | 0 refills | Status: DC | PRN

## 2020-10-22 MED ORDER — IBUPROFEN 800 MG PO TABS: 800.0000 mg | ORAL_TABLET | Freq: Three times a day (TID) | ORAL | 0 refills | Status: DC | PRN

## 2020-10-22 NOTE — Assessment & Plan Note (Signed)
Incidentally found - 29mm LUL Not at high risk for lung cancer (non smoker, no fmhx lung cancer). No follow up needed.

## 2020-10-22 NOTE — Assessment & Plan Note (Signed)
Anticipate thoracic strain.  Rx robaxin + ibuprofen 800mg  for next few days/week.  Supportive care measures reviewed. Update if ongoing symptoms or not improving with treatment. Pt agrees with plan.

## 2020-10-22 NOTE — Progress Notes (Signed)
Patient ID: Gary Long, male    DOB: [birth year], 50 y.o.   MRN: [id number]  This visit was conducted in person.  BP 120/80   Pulse 72   Temp (!) 97.4 F (36.3 C) (Temporal)   Ht _0  (1.753 m)   Wt 190 lb 1 oz (86.2 kg)   SpO2 96%   BMI 28.07 kg/m    CC: R flank pain  Subjective:   HPI: Gary Long is a 50 y.o. male presenting on 10/22/2020 for Back Pain (C/o right mid back/flank pain.  Started about 2 wks ago.  Tried ibuprofen, helpful. )   2 wk h/o R flank pain. No urinary changes, blood in stool, dysuria urgency or frequency. Previously pleuritic pain.  This week pain has improved with regular ibuprofen use (62m BID).  Also tried flexeril with some pain but overly sedating.  No fevers/chills, coughing, chest pain, abd pain, nausea, diarrhea, gassiness, indigestion or bloating.  Denies inciting trauma/injury or falls.  Seen at ER on 10/16/2020 with reassuring CTA. Did snow 427mLUL lung nodule - non smoker. All ER records reviewed.   Finds amaryl works better thRaytheon     Relevant past medical, surgical, family and social history reviewed and updated as indicated. Interim medical history since our last visit reviewed. Allergies and medications reviewed and updated. Outpatient Medications Prior to Visit  Medication Sig Dispense Refill   Accu-Chek FastClix Lancets MISC USE AS DIRECTED TO CHECK BLOOD SUGAR ONCE DAILY 100 each 0   atorvastatin (LIPITOR) 40 MG tablet TAKE 1 TABLET BY MOUTH EVERY DAY 90 tablet 3   Blood Glucose Monitoring Suppl (ACCU-CHEK GUIDE) w/Device KIT 1 each by Does not apply route as needed. Use as directed to check blood sugar once daily 1 kit 0   FARXIGA 5 MG TABS tablet TAKE 1 TABLET BY MOUTH DAILY BEFORE BREAKFAST. 30 tablet 11   glimepiride (AMARYL) 2 MG tablet TAKE 1 TABLET BY MOUTH DAILY BEFORE BREAKFAST. 90 tablet 2   glucose blood (ACCU-CHEK GUIDE) test strip USE AS INSTRUCTED TO CHECK BLOOD SUGAR ONCE DAILY 50 strip  3   hydrOXYzine (ATARAX/VISTARIL) 25 MG tablet TAKE 1/2 TO 1 TABLET BY MOUTH 3 TIMES A DAY AS NEEDED FOR ITCHING (SEDATION PRECAUTIONS) 30 tablet 0   lidocaine (LIDODERM) 5 % Place 1 patch onto the skin every 12 (twelve) hours. Remove & Discard patch within 12 hours or as directed by MD 10 patch 0   metFORMIN (GLUCOPHAGE) 500 MG tablet TAKE 1 TABLET (500 MG TOTAL) BY MOUTH 2 (TWO) TIMES DAILY WITH A MEAL. 180 tablet 3   pantoprazole (PROTONIX) 40 MG tablet Take 1 tablet (40 mg total) by mouth daily as needed (heartburn). 30 tablet 3   sildenafil (VIAGRA) 50 MG tablet Take 1 tablet (50 mg total) by mouth daily as needed for erectile dysfunction. 10 tablet 0   triamcinolone cream (KENALOG) 0.1 % APPLY TOPICALLY 2 (TWO) TIMES DAILY AS NEEDED. MAX 10 DAYS AT A TIME 80 g 0   cyclobenzaprine (FLEXERIL) 10 MG tablet Take 0.5-1 tablets (5-10 mg total) by mouth 3 (three) times daily as needed for muscle spasms (headache). 30 tablet 1   No facility-administered medications prior to visit.     Per HPI unless specifically indicated in ROS section below Review of Systems  Objective:  BP 120/80   Pulse 72   Temp (!) 97.4 F (36.3 C) (Temporal)   Ht _1  (1.753 m)   Wt  190 lb 1 oz (86.2 kg)   SpO2 96%   BMI 28.07 kg/m   Wt Readings from Last 3 Encounters:  10/22/20 190 lb 1 oz (86.2 kg)  10/16/20 187 lb (84.8 kg)  05/28/20 192 lb (87.1 kg)      Physical Exam Vitals and nursing note reviewed.  Constitutional:      Appearance: Normal appearance. He is not ill-appearing.  Cardiovascular:     Rate and Rhythm: Normal rate and regular rhythm.     Pulses: Normal pulses.     Heart sounds: Normal heart sounds. No murmur heard. Pulmonary:     Effort: Pulmonary effort is normal. No respiratory distress.     Breath sounds: Normal breath sounds. No wheezing, rhonchi or rales.  Abdominal:     General: Bowel sounds are normal. There is no distension.     Palpations: Abdomen is soft. There is no mass.      Tenderness: There is no abdominal tenderness. There is right CVA tenderness (mild). There is no left CVA tenderness, guarding or rebound.     Hernia: No hernia is present.  Musculoskeletal:     Right lower leg: No edema.     Left lower leg: No edema.     Comments:  No midline thoracic spine tenderness No pain at rhomboids Discomfort to palpation at R mid thoracic paraspinous mm  Skin:    General: Skin is warm and dry.     Findings: Erythema (patch of erythema present to lower R thoracic region without rash) present. No rash.  Neurological:     Mental Status: He is alert.      Results for orders placed or performed in visit on 10/22/20  POCT Urinalysis Dipstick (Automated)  Result Value Ref Range   Color, UA yellow    Clarity, UA clear    Glucose, UA Negative Negative   Bilirubin, UA negative    Ketones, UA negative    Spec Grav, UA 1.015 1.010 - 1.025   Blood, UA negative    pH, UA 6.0 5.0 - 8.0   Protein, UA Negative Negative   Urobilinogen, UA 0.2 0.2 or 1.0 E.U./dL   Nitrite, UA negative    Leukocytes, UA Negative Negative    Assessment & Plan:  This visit occurred during the SARS-CoV-2 public health emergency.  Safety protocols were in place, including screening questions prior to the visit, additional usage of staff PPE, and extensive cleaning of exam room while observing appropriate contact time as indicated for disinfecting solutions.   Problem List Items Addressed This Visit     Right-sided thoracic back pain - Primary    Anticipate thoracic strain.  Rx robaxin + ibuprofen 856m for next few days/week.  Supportive care measures reviewed. Update if ongoing symptoms or not improving with treatment. Pt agrees with plan.       Relevant Medications   ibuprofen (ADVIL) 800 MG tablet   methocarbamol (ROBAXIN) 500 MG tablet   Pulmonary nodule    Incidentally found - 415mLUL Not at high risk for lung cancer (non smoker, no fmhx lung cancer). No follow up needed.        Other Visit Diagnoses     Flank pain       Relevant Orders   POCT Urinalysis Dipstick (Automated) (Completed)        Meds ordered this encounter  Medications   ibuprofen (ADVIL) 800 MG tablet    Sig: Take 1 tablet (800 mg total) by mouth every 8 (  eight) hours as needed for moderate pain.    Dispense:  30 tablet    Refill:  0   methocarbamol (ROBAXIN) 500 MG tablet    Sig: Take 1 tablet (500 mg total) by mouth 3 (three) times daily as needed for muscle spasms (sedation precautions).    Dispense:  40 tablet    Refill:  0    Orders Placed This Encounter  Procedures   POCT Urinalysis Dipstick (Automated)     Patient Instructions  I think you had thoracic back strain.  Treat with ibuprofen as up to now, with meals, add robaxin muscle relaxant.  Ice/heat to the back.  Gentle stretching of the back. Let us know if not improved with this. Urine looked ok today.   Follow up plan: Return if symptoms worsen or fail to improve.  Ria Bush, MD

## 2020-10-22 NOTE — Patient Instructions (Signed)
I think you had thoracic back strain.  Treat with ibuprofen as up to now, with meals, add robaxin muscle relaxant.  Ice/heat to the back.  Gentle stretching of the back. Let us know if not improved with this. Urine looked ok today.

## 2020-11-10 ENCOUNTER — Other Ambulatory Visit: Payer: 59

## 2020-11-17 ENCOUNTER — Other Ambulatory Visit: Payer: Self-pay

## 2020-11-17 ENCOUNTER — Ambulatory Visit (INDEPENDENT_AMBULATORY_CARE_PROVIDER_SITE_OTHER): Payer: 59 | Admitting: Family Medicine

## 2020-11-17 ENCOUNTER — Encounter: Payer: Self-pay | Admitting: Family Medicine

## 2020-11-17 VITALS — BP 118/72 | HR 95 | Temp 98.0°F | Ht 69.5 in | Wt 189.3 lb

## 2020-11-17 DIAGNOSIS — E785 Hyperlipidemia, unspecified: Secondary | ICD-10-CM | POA: Diagnosis not present

## 2020-11-17 DIAGNOSIS — Z0001 Encounter for general adult medical examination with abnormal findings: Secondary | ICD-10-CM

## 2020-11-17 DIAGNOSIS — K219 Gastro-esophageal reflux disease without esophagitis: Secondary | ICD-10-CM

## 2020-11-17 DIAGNOSIS — Z125 Encounter for screening for malignant neoplasm of prostate: Secondary | ICD-10-CM

## 2020-11-17 DIAGNOSIS — E118 Type 2 diabetes mellitus with unspecified complications: Secondary | ICD-10-CM | POA: Diagnosis not present

## 2020-11-17 DIAGNOSIS — M25562 Pain in left knee: Secondary | ICD-10-CM | POA: Diagnosis not present

## 2020-11-17 DIAGNOSIS — D696 Thrombocytopenia, unspecified: Secondary | ICD-10-CM | POA: Diagnosis not present

## 2020-11-17 DIAGNOSIS — E1169 Type 2 diabetes mellitus with other specified complication: Secondary | ICD-10-CM | POA: Diagnosis not present

## 2020-11-17 LAB — CBC WITH DIFFERENTIAL/PLATELET
Basophils Absolute: 0 10*3/uL (ref 0.0–0.1)
Basophils Relative: 0.3 % (ref 0.0–3.0)
Eosinophils Absolute: 0.1 10*3/uL (ref 0.0–0.7)
Eosinophils Relative: 2.1 % (ref 0.0–5.0)
HCT: 40.9 % (ref 39.0–52.0)
Hemoglobin: 13.4 g/dL (ref 13.0–17.0)
Lymphocytes Relative: 42.3 % (ref 12.0–46.0)
Lymphs Abs: 1.9 10*3/uL (ref 0.7–4.0)
MCHC: 32.7 g/dL (ref 30.0–36.0)
MCV: 83.5 fl (ref 78.0–100.0)
Monocytes Absolute: 0.4 10*3/uL (ref 0.1–1.0)
Monocytes Relative: 9.7 % (ref 3.0–12.0)
Neutro Abs: 2.1 10*3/uL (ref 1.4–7.7)
Neutrophils Relative %: 45.6 % (ref 43.0–77.0)
Platelets: 106 10*3/uL — ABNORMAL LOW (ref 150.0–400.0)
RBC: 4.9 Mil/uL (ref 4.22–5.81)
RDW: 14.1 % (ref 11.5–15.5)
WBC: 4.6 10*3/uL (ref 4.0–10.5)

## 2020-11-17 LAB — COMPREHENSIVE METABOLIC PANEL
ALT: 20 U/L (ref 0–53)
AST: 14 U/L (ref 0–37)
Albumin: 4.1 g/dL (ref 3.5–5.2)
Alkaline Phosphatase: 84 U/L (ref 39–117)
BUN: 14 mg/dL (ref 6–23)
CO2: 29 mEq/L (ref 19–32)
Calcium: 9 mg/dL (ref 8.4–10.5)
Chloride: 103 mEq/L (ref 96–112)
Creatinine, Ser: 1.07 mg/dL (ref 0.40–1.50)
GFR: 81.03 mL/min (ref >60.00)
Glucose, Bld: 204 mg/dL — ABNORMAL HIGH (ref 70–99)
Potassium: 3.7 mEq/L (ref 3.5–5.1)
Sodium: 138 mEq/L (ref 135–145)
Total Bilirubin: 0.4 mg/dL (ref 0.2–1.2)
Total Protein: 6.9 g/dL (ref 6.0–8.3)

## 2020-11-17 LAB — LIPID PANEL
Cholesterol: 183 mg/dL (ref 0–200)
HDL: 56 mg/dL (ref >39.00)
LDL Cholesterol: 99 mg/dL (ref 0–99)
NonHDL: 127.44
Total CHOL/HDL Ratio: 3
Triglycerides: 140 mg/dL (ref 0.0–149.0)
VLDL: 28 mg/dL (ref 0.0–40.0)

## 2020-11-17 LAB — VITAMIN D 25 HYDROXY (VIT D DEFICIENCY, FRACTURES): VITD: 16.62 ng/mL — ABNORMAL LOW (ref 30.00–100.00)

## 2020-11-17 LAB — HEMOGLOBIN A1C: Hgb A1c MFr Bld: 8.1 % — ABNORMAL HIGH (ref 4.6–6.5)

## 2020-11-17 LAB — PSA: PSA: 1.24 ng/mL (ref 0.10–4.00)

## 2020-11-17 MED ORDER — ATORVASTATIN CALCIUM 40 MG PO TABS: 40.0000 mg | ORAL_TABLET | Freq: Every day | ORAL | 3 refills | Status: DC

## 2020-11-17 MED ORDER — GLIMEPIRIDE 2 MG PO TABS: 2.0000 mg | ORAL_TABLET | Freq: Every day | ORAL | 3 refills | Status: DC

## 2020-11-17 MED ORDER — METFORMIN HCL 500 MG PO TABS: 500.0000 mg | ORAL_TABLET | Freq: Two times a day (BID) | ORAL | 3 refills | Status: DC

## 2020-11-17 NOTE — Assessment & Plan Note (Signed)
Ongoing symptoms, only uses PPI intermittently with pepto bismol for severe symptoms.

## 2020-11-17 NOTE — Assessment & Plan Note (Signed)
Anticipate pes anserine bursitis - provided with exercises from [facility] pt advisor, rec topical voltaren, and rec f/u with Dr Patsy Lager sports medicine if ongoing pain. He could also have component of osteoarthritis.

## 2020-11-17 NOTE — Assessment & Plan Note (Signed)
Chronic, continues metformin 500mg  BID and amaryl 2mg  daily with breakfast.  He stopped farxiga.  Will await A1c today then make medication recommendations.

## 2020-11-17 NOTE — Assessment & Plan Note (Signed)
Chronic on atorvastatin 40mg  daily - update FLP. The 10-year ASCVD risk score (Arnett DK, et al., 2019) is: 8%   Values used to calculate the score:     Age: 50 years     Sex: Male     Is Non-Hispanic African American: Yes     Diabetic: Yes     Tobacco smoker: No     Systolic Blood Pressure: 118 mmHg     Is BP treated: No     HDL Cholesterol: 54.5 mg/dL     Total Cholesterol: 167 mg/dL

## 2020-11-17 NOTE — Patient Instructions (Addendum)
Pass by lab to pick up stool kit. Labs today.  Consider shingrix vaccine.  For knee, possible bursitis. Try exercises provided today. If not better, return to see Dr Lorelei Pont sports medicine.  Return as needed or in 6 months for diabetes follow up visit.   Health Maintenance, Male Adopting a healthy lifestyle and getting preventive care are important in promoting health and wellness. Ask your health care provider about: The right schedule for you to have regular tests and exams. Things you can do on your own to prevent diseases and keep yourself healthy. What should I know about diet, weight, and exercise? Eat a healthy diet  Eat a diet that includes plenty of vegetables, fruits, low-fat dairy products, and lean protein. Do not eat a lot of foods that are high in solid fats, added sugars, or sodium. Maintain a healthy weight Body mass index (BMI) is a measurement that can be used to identify possible weight problems. It estimates body fat based on height and weight. Your health care provider can help determine your BMI and help you achieve or maintain a healthy weight. Get regular exercise Get regular exercise. This is one of the most important things you can do for your health. Most adults should: Exercise for at least 150 minutes each week. The exercise should increase your heart rate and make you sweat (moderate-intensity exercise). Do strengthening exercises at least twice a week. This is in addition to the moderate-intensity exercise. Spend less time sitting. Even light physical activity can be beneficial. Watch cholesterol and blood lipids Have your blood tested for lipids and cholesterol at 50 years of age, then have this test every 5 years. You may need to have your cholesterol levels checked more often if: Your lipid or cholesterol levels are high. You are older than 50 years of age. You are at high risk for heart disease. What should I know about cancer screening? Many types of  cancers can be detected early and may often be prevented. Depending on your health history and family history, you may need to have cancer screening at various ages. This may include screening for: Colorectal cancer. Prostate cancer. Skin cancer. Lung cancer. What should I know about heart disease, diabetes, and high blood pressure? Blood pressure and heart disease High blood pressure causes heart disease and increases the risk of stroke. This is more likely to develop in people who have high blood pressure readings or are overweight. Talk with your health care provider about your target blood pressure readings. Have your blood pressure checked: Every 3-5 years if you are 35-50 years of age. Every year if you are 71 years old or older. If you are between the ages of 42 and 74 and are a current or former smoker, ask your health care provider if you should have a one-time screening for abdominal aortic aneurysm (AAA). Diabetes Have regular diabetes screenings. This checks your fasting blood sugar level. Have the screening done: Once every three years after age 19 if you are at a normal weight and have a low risk for diabetes. More often and at a younger age if you are overweight or have a high risk for diabetes. What should I know about preventing infection? Hepatitis B If you have a higher risk for hepatitis B, you should be screened for this virus. Talk with your health care provider to find out if you are at risk for hepatitis B infection. Hepatitis C Blood testing is recommended for: Everyone born from [birth year] through  [place] with known risk factors for hepatitis C. Sexually transmitted infections (STIs) You should be screened each year for STIs, including gonorrhea and chlamydia, if: You are sexually active and are younger than 50 years of age. You are older than 50 years of age and your health care provider tells you that you are at risk for this type of infection. Your sexual  activity has changed since you were last screened, and you are at increased risk for chlamydia or gonorrhea. Ask your health care provider if you are at risk. Ask your health care provider about whether you are at high risk for HIV. Your health care provider may recommend a prescription medicine to help prevent HIV infection. If you choose to take medicine to prevent HIV, you should first get tested for HIV. You should then be tested every 3 months for as long as you are taking the medicine. Follow these instructions at home: Alcohol use Do not drink alcohol if your health care provider tells you not to drink. If you drink alcohol: Limit how much you have to 0-2 drinks a day. Know how much alcohol is in your drink. In the [place] one drink equals one 12 oz bottle of beer (355 mL), one 5 oz glass of wine (148 mL), or one 1 oz glass of hard liquor (44 mL). Lifestyle Do not use any products that contain nicotine or tobacco. These products include cigarettes, chewing tobacco, and vaping devices, such as e-cigarettes. If you need help quitting, ask your health care provider. Do not use street drugs. Do not share needles. Ask your health care provider for help if you need support or information about quitting drugs. General instructions Schedule regular health, dental, and eye exams. Stay current with your vaccines. Tell your health care provider if: You often feel depressed. You have ever been abused or do not feel safe at home. Summary Adopting a healthy lifestyle and getting preventive care are important in promoting health and wellness. Follow your health care provider's instructions about healthy diet, exercising, and getting tested or screened for diseases. Follow your health care provider's instructions on monitoring your cholesterol and blood pressure. This information is not intended to replace advice given to you by your health care provider. Make sure you discuss any questions you have  with your health care provider. Document Revised: 05/17/2020 Document Reviewed: 05/17/2020 Elsevier Patient Education  [place].

## 2020-11-17 NOTE — Assessment & Plan Note (Signed)
Preventative protocols reviewed and updated unless pt declined. Discussed healthy diet and lifestyle.  

## 2020-11-17 NOTE — Progress Notes (Signed)
Patient ID: Gary Long, male    DOB: 1970/10/14, 50 y.o.   MRN: [id number]  This visit was conducted in person.  BP 118/72   Pulse 95   Temp 98 F (36.7 C) (Temporal)   Ht 5' 9.5" (1.765 m)   Wt 189 lb 5 oz (85.9 kg)   SpO2 97%   BMI 27.56 kg/m    CC: CPE Subjective:   HPI: Gary Long is a 50 y.o. male presenting on 11/17/2020 for Annual Exam   L knee pain present for months. Denies inciting trauma/injury or falls. No locking.  H/o R knee surgery for meniscal tear.   Notes ongoing heartburn, manages with pantoprazole PRN, only pepto bismol helps when severe.   Off farxiga for the past month - unclear why stopped. Continues metformin and amaryl.   Preventative: Colon cancer screening - yearly iFOB  Prostate cancer screen - discussed, check yearly. No fmhx.  Lung cancer screening - not eligible  Flu shot yearly COVID vaccine - Moderna 01/2019, 02/2019, no booster Pneumovax 03/2012 Tetanus - 03/2012 Shingrix - discussed.  Seat belt use discussed Sunscreen use discussed. No changing moles on skin.  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 Interim medical history since our last visit reviewed. Allergies and medications reviewed and updated. Outpatient Medications Prior to Visit  Medication Sig Dispense Refill   Accu-Chek FastClix Lancets MISC USE AS DIRECTED TO CHECK BLOOD SUGAR ONCE DAILY 100 each 0   Blood Glucose Monitoring Suppl (ACCU-CHEK GUIDE) w/Device KIT 1 each by Does not apply route as needed. Use as directed to check blood sugar once daily 1 kit 0   glucose blood (ACCU-CHEK  GUIDE) test strip USE AS INSTRUCTED TO CHECK BLOOD SUGAR ONCE DAILY 50 strip 3   hydrOXYzine (ATARAX/VISTARIL) 25 MG tablet TAKE 1/2 TO 1 TABLET BY MOUTH 3 TIMES A DAY AS NEEDED FOR ITCHING (SEDATION PRECAUTIONS) 30 tablet 0   ibuprofen (ADVIL) 800 MG tablet Take 1 tablet (800 mg total) by mouth every 8 (eight) hours as needed for moderate pain. 30 tablet 0   lidocaine (LIDODERM) 5 % Place 1 patch onto the skin every 12 (twelve) hours. Remove & Discard patch within 12 hours or as directed by MD 10 patch 0   methocarbamol (ROBAXIN) 500 MG tablet Take 1 tablet (500 mg total) by mouth 3 (three) times daily as needed for muscle spasms (sedation precautions). 40 tablet 0   pantoprazole (PROTONIX) 40 MG tablet Take 1 tablet (40 mg total) by mouth daily as needed (heartburn). 30 tablet 3   sildenafil (VIAGRA) 50 MG tablet Take 1 tablet (50 mg total) by mouth daily as needed for erectile dysfunction. 10 tablet 0   triamcinolone cream (KENALOG) 0.1 % APPLY TOPICALLY 2 (TWO) TIMES DAILY AS NEEDED. MAX 10 DAYS AT A TIME 80 g 0   atorvastatin (LIPITOR) 40 MG tablet TAKE 1 TABLET BY MOUTH EVERY DAY 90 tablet 3   FARXIGA 5 MG TABS tablet TAKE 1 TABLET BY MOUTH DAILY BEFORE BREAKFAST. 30 tablet 11   glimepiride (AMARYL) 2 MG tablet TAKE 1 TABLET BY MOUTH  DAILY BEFORE BREAKFAST. 90 tablet 2   metFORMIN (GLUCOPHAGE) 500 MG tablet TAKE 1 TABLET (500 MG TOTAL) BY MOUTH 2 (TWO) TIMES DAILY WITH A MEAL. 180 tablet 3   No facility-administered medications prior to visit.     Per HPI unless specifically indicated in ROS section below Review of Systems  Constitutional:  Negative for activity change, appetite change, chills, fatigue, fever and unexpected weight change.  HENT:  Negative for hearing loss.   Eyes:  Negative for visual disturbance.  Respiratory:  Negative for cough, chest tightness, shortness of breath and wheezing.   Cardiovascular:  Negative for chest pain, palpitations and leg swelling.   Gastrointestinal:  Negative for abdominal distention, abdominal pain, blood in stool, constipation, diarrhea, nausea and vomiting.  Genitourinary:  Negative for difficulty urinating and hematuria.  Musculoskeletal:  Negative for arthralgias, myalgias and neck pain.  Skin:  Negative for rash.  Neurological:  Positive for headaches (occ). Negative for dizziness, seizures and syncope.  Hematological:  Negative for adenopathy. Does not bruise/bleed easily.  Psychiatric/Behavioral:  Negative for dysphoric mood. The patient is not nervous/anxious.    Objective:  BP 118/72   Pulse 95   Temp 98 F (36.7 C) (Temporal)   Ht 5' 9.5" (1.765 m)   Wt 189 lb 5 oz (85.9 kg)   SpO2 97%   BMI 27.56 kg/m   Wt Readings from Last 3 Encounters:  11/17/20 189 lb 5 oz (85.9 kg)  10/22/20 190 lb 1 oz (86.2 kg)  10/16/20 187 lb (84.8 kg)      Physical Exam Vitals and nursing note reviewed.  Constitutional:      General: He is not in acute distress.    Appearance: Normal appearance. He is well-developed. He is not ill-appearing.  HENT:     Head: Normocephalic and atraumatic.     Right Ear: Hearing, tympanic membrane, ear canal and external ear normal.     Left Ear: Hearing, tympanic membrane, ear canal and external ear normal.  Eyes:     General: No scleral icterus.    Extraocular Movements: Extraocular movements intact.     Conjunctiva/sclera: Conjunctivae normal.     Pupils: Pupils are equal, round, and reactive to light.  Neck:     Thyroid: No thyroid mass or thyromegaly.  Cardiovascular:     Rate and Rhythm: Normal rate and regular rhythm.     Pulses: Normal pulses.          Radial pulses are 2+ on the right side and 2+ on the left side.     Heart sounds: Normal heart sounds. No murmur heard. Pulmonary:     Effort: Pulmonary effort is normal. No respiratory distress.     Breath sounds: Normal breath sounds. No wheezing, rhonchi or rales.  Abdominal:     General: Bowel sounds are normal.  There is no distension.     Palpations: Abdomen is soft. There is no mass.     Tenderness: There is no abdominal tenderness. There is no guarding or rebound.     Hernia: No hernia is present.  Musculoskeletal:        General: Normal range of motion.     Cervical back: Normal range of motion and neck supple.     Right lower leg: No edema.     Left lower leg: No edema.     Comments:  L knee exam: No deformity on inspection. Tender to palpation at L pes anserine bursa as well as anterior knee at  tibial plateau No effusion/swelling noted. FROM in flex/extension without crepitus. No popliteal fullness. Neg drawer test. Neg mcmurray test. No pain with valgus/varus stress. No PFgrind. No abnormal patellar mobility.   Lymphadenopathy:     Cervical: No cervical adenopathy.  Skin:    General: Skin is warm and dry.     Findings: No rash.  Neurological:     General: No focal deficit present.     Mental Status: He is alert and oriented to person, place, and time.  Psychiatric:        Mood and Affect: Mood normal.        Behavior: Behavior normal.        Thought Content: Thought content normal.        Judgment: Judgment normal.      Results for orders placed or performed in visit on 10/22/20  POCT Urinalysis Dipstick (Automated)  Result Value Ref Range   Color, UA yellow    Clarity, UA clear    Glucose, UA Negative Negative   Bilirubin, UA negative    Ketones, UA negative    Spec Grav, UA 1.015 1.010 - 1.025   Blood, UA negative    pH, UA 6.0 5.0 - 8.0   Protein, UA Negative Negative   Urobilinogen, UA 0.2 0.2 or 1.0 E.U./dL   Nitrite, UA negative    Leukocytes, UA Negative Negative    Assessment & Plan:  This visit occurred during the SARS-CoV-2 public health emergency.  Safety protocols were in place, including screening questions prior to the visit, additional usage of staff PPE, and extensive cleaning of exam room while observing appropriate contact time as indicated for  disinfecting solutions.   Problem List Items Addressed This Visit     Encounter for general adult medical examination with abnormal findings - Primary (Chronic)    Preventative protocols reviewed and updated unless pt declined. Discussed healthy diet and lifestyle.       Hyperlipidemia associated with type 2 diabetes mellitus (HCC)    Chronic on atorvastatin 43m daily - update FLP. The 10-year ASCVD risk score (Arnett DK, et al., 2019) is: 8%   Values used to calculate the score:     Age: [age]years     Sex: Male     Is Non-Hispanic African American: Yes     Diabetic: Yes     Tobacco smoker: No     Systolic Blood Pressure: 1156mmHg     Is BP treated: No     HDL Cholesterol: 54.5 mg/dL     Total Cholesterol: 167 mg/dL       Relevant Medications   metFORMIN (GLUCOPHAGE) 500 MG tablet   glimepiride (AMARYL) 2 MG tablet   atorvastatin (LIPITOR) 40 MG tablet   Other Relevant Orders   Lipid panel   Comprehensive metabolic panel   Controlled diabetes mellitus type 2 with complications (HCC)    Chronic, continues metformin 505mBID and amaryl 3m35maily with breakfast.  He stopped farxiga.  Will await A1c today then make medication recommendations.       Relevant Medications   metFORMIN (GLUCOPHAGE) 500 MG tablet   glimepiride (AMARYL) 2 MG tablet   atorvastatin (LIPITOR) 40 MG tablet   Other Relevant Orders   Hemoglobin A1c   GERD (gastroesophageal reflux disease)    Ongoing symptoms, only uses PPI intermittently with pepto bismol for severe symptoms.       Left anterior knee pain    Anticipate pes anserine bursitis - provided with  exercises from [facility] pt advisor, rec topical voltaren, and rec f/u with Dr Lorelei Pont sports medicine if ongoing pain. He could also have component of osteoarthritis.       Relevant Orders   VITAMIN D 25 Hydroxy (Vit-D Deficiency, Fractures)   Thrombocytopenia (HCC)    Update CBC       Relevant Orders   CBC with Differential/Platelet   Other  Visit Diagnoses     Special screening for malignant neoplasm of prostate       Relevant Orders   PSA        Meds ordered this encounter  Medications   metFORMIN (GLUCOPHAGE) 500 MG tablet    Sig: Take 1 tablet (500 mg total) by mouth 2 (two) times daily with a meal.    Dispense:  180 tablet    Refill:  3   glimepiride (AMARYL) 2 MG tablet    Sig: Take 1 tablet (2 mg total) by mouth daily before breakfast.    Dispense:  90 tablet    Refill:  3   atorvastatin (LIPITOR) 40 MG tablet    Sig: Take 1 tablet (40 mg total) by mouth daily.    Dispense:  90 tablet    Refill:  3   Orders Placed This Encounter  Procedures   Lipid panel   Comprehensive metabolic panel   Hemoglobin A1c   PSA   CBC with Differential/Platelet   VITAMIN D 25 Hydroxy (Vit-D Deficiency, Fractures)    Patient instructions: Pass by lab to pick up stool kit. Labs today.  Consider shingrix vaccine.  For knee, possible bursitis. Try exercises provided today. If not better, return to see Dr Lorelei Pont sports medicine.  Return as needed or in 6 months for diabetes follow up visit.   Follow up plan: Return in about 1 year (around 11/17/2021) for annual exam, prior fasting for blood work.  Ria Bush, MD

## 2020-11-17 NOTE — Assessment & Plan Note (Signed)
Update CBC. 

## 2020-11-19 ENCOUNTER — Telehealth: Payer: Self-pay | Admitting: Radiology

## 2020-11-19 ENCOUNTER — Other Ambulatory Visit (INDEPENDENT_AMBULATORY_CARE_PROVIDER_SITE_OTHER): Payer: 59

## 2020-11-19 DIAGNOSIS — Z1211 Encounter for screening for malignant neoplasm of colon: Secondary | ICD-10-CM

## 2020-11-19 LAB — FECAL OCCULT BLOOD, IMMUNOCHEMICAL: Fecal Occult Bld: POSITIVE — AB

## 2020-11-19 NOTE — Telephone Encounter (Signed)
Plz notify iFOB returned positive therefore recommend GI referral for colonoscopy - referral placed.

## 2020-11-19 NOTE — Telephone Encounter (Addendum)
Lvm asking pt to call back.  Need to relay Dr. Timoteo Expose message.   Also, Dr. Reece Agar sent info to pt via MyChart.

## 2020-11-19 NOTE — Telephone Encounter (Signed)
[facility] called a positive ifob, results given to Dr Gutierrez 

## 2020-11-21 ENCOUNTER — Encounter: Payer: Self-pay | Admitting: Family Medicine

## 2020-11-21 ENCOUNTER — Other Ambulatory Visit: Payer: Self-pay | Admitting: Family Medicine

## 2020-11-21 MED ORDER — DAPAGLIFLOZIN PROPANEDIOL 5 MG PO TABS: 5.0000 mg | ORAL_TABLET | Freq: Every day | ORAL | 11 refills | Status: DC

## 2020-11-21 MED ORDER — VITAMIN D3 50 MCG (2000 UT) PO CAPS: 2000.0000 [IU] | ORAL_CAPSULE | Freq: Every day | ORAL | Status: DC

## 2020-12-06 ENCOUNTER — Ambulatory Visit: Payer: 59 | Admitting: Family Medicine

## 2020-12-06 ENCOUNTER — Telehealth: Payer: Self-pay

## 2020-12-06 NOTE — Telephone Encounter (Signed)
Following message was received patient appointment was cancelled already. No further action needed.   [facility] Night - Client Nonclinical Telephone Record  [facility] Client [facility] Night - Client Client Site [facility] - Night Provider Hannah Beat - MD Contact Type Call Who Is Calling Patient / Member / Family / Caregiver Caller Name Burech Mcfarland Phone Number [phone] Call Type Message Only Information Provided Reason for Call Returning a Call from the Office Initial Comment Caller states returning call to the office re appt this afternoon w/Dr Copland; Additional Comment Called mainline number/No ans. Appt is re knee and needs to see a sports med MD. Jethro Bolus declined triage.since he has spoken w/MD about it. Disp. Time Disposition Final User 12/06/2020 7:54:11 AM General Information Provided Yes Albin Fischer Call Closed By: Albin Fischer Transaction Date/Time: 12/06/2020 7:48:09 AM (ET)

## 2020-12-07 MED ORDER — TADALAFIL 10 MG PO TABS: 10.0000 mg | ORAL_TABLET | ORAL | 1 refills | Status: DC | PRN

## 2020-12-20 ENCOUNTER — Other Ambulatory Visit: Payer: Self-pay

## 2020-12-20 ENCOUNTER — Ambulatory Visit (INDEPENDENT_AMBULATORY_CARE_PROVIDER_SITE_OTHER)
Admission: RE | Admit: 2020-12-20 | Discharge: 2020-12-20 | Disposition: A | Discharge status: Home / Self Care | Payer: 59 | Source: Ambulatory Visit | Attending: Family Medicine | Admitting: Family Medicine

## 2020-12-20 ENCOUNTER — Ambulatory Visit (INDEPENDENT_AMBULATORY_CARE_PROVIDER_SITE_OTHER): Payer: 59 | Admitting: Family Medicine

## 2020-12-20 VITALS — BP 124/68 | HR 81 | Temp 98.4°F | Resp 14 | Ht 69.5 in | Wt 189.4 lb

## 2020-12-20 DIAGNOSIS — M25562 Pain in left knee: Secondary | ICD-10-CM | POA: Diagnosis not present

## 2020-12-20 DIAGNOSIS — E118 Type 2 diabetes mellitus with unspecified complications: Secondary | ICD-10-CM

## 2020-12-20 DIAGNOSIS — G8929 Other chronic pain: Secondary | ICD-10-CM

## 2020-12-20 DIAGNOSIS — M1712 Unilateral primary osteoarthritis, left knee: Secondary | ICD-10-CM | POA: Diagnosis not present

## 2020-12-20 MED ORDER — TRIAMCINOLONE ACETONIDE 40 MG/ML IJ SUSP
40.0000 mg | Freq: Once | INTRAMUSCULAR | Status: AC
  Administered 2020-12-20: 40 mg via INTRA_ARTICULAR

## 2020-12-20 NOTE — Progress Notes (Signed)
Gary Reising T. Baleria Wyman, MD, [place] [facility] [place], [address]  Phone: [phone]  FAX: [phone]  Gary Long - 50 y.o. male  MRN [id number]  Date of Birth: 09/20/1970  Date: 12/20/2020  PCP: Ria Bush, MD  Referral: Ria Bush, MD  Chief Complaint  Patient presents with   Knee Pain    Left knee more of an issue in the past 2 years. He has had surgery on his right knee in the past. NO injury to the left knee. Described as achy pain.     This visit occurred during the SARS-CoV-2 public health emergency.  Safety protocols were in place, including screening questions prior to the visit, additional usage of staff PPE, and extensive cleaning of exam room while observing appropriate contact time as indicated for disinfecting solutions.   Subjective:   Gary Long is a 50 y.o. very pleasant male patient with Body mass index is 27.56 kg/m. who presents with the following:  He presents with knee pain.  Today this is on the left.  Does have a significant right-sided knee history.  He had an arthroscopy back in 2002, and thinks that he had a partial meniscectomy.  The chart does not have ACL reconstruction, but he does not think he had that.  This is not clear.  He does have a history of right-sided knee arthroscopy with R subchondroplasty on the R lateral tibial plateau by Dr. Erlinda Hong in 2018.  He also had a synovectomy as well as partial meniscectomy.  Left knee has been ongoing now for more than 2 years.  He has tried ice, NSAIDs, and heat. 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  Physical activity does hurt his knee on the left.  He has not had any locking up or instability.  Lab Results  Component Value Date   HGBA1C 8.1 (H) 11/17/2020     Review of Systems is noted in the HPI, as appropriate  Objective:    BP 124/68   Pulse 81   Temp 98.4 F (36.9 C)   Resp 14   Ht 5' 9.5" (1.765 m)   Wt 189 lb 6 oz (85.9 kg)   SpO2 98%   BMI 27.56 kg/m   GEN: No acute distress; alert,appropriate. PULM: Breathing comfortably in no respiratory distress PSYCH: Normally interactive.   Left knee: Hyperextension of 4 degrees with flexion to 125.  No effusion.  No pain with loading the patellar facets.  Stable to varus and valgus stress.  ACL and PCL are intact.  Does have some mild medial joint line tenderness and to a lesser extent laterally.  Modest pain with flexion pinch and McMurray's, but no mechanical symptoms.  Laboratory and Imaging Data:  Assessment and Plan:     ICD-10-CM   1. Primary osteoarthritis of left knee  M17.12 triamcinolone acetonide (KENALOG-40) injection 40 mg    2. Chronic pain of left knee  M25.562 DG Knee 4 Views W/Patella Left   G89.29     3. Controlled type 2 diabetes mellitus with complication, without long-term current use of insulin (HCC)  E11.8      Plain films do show some mild degenerative changes.  With history, exam, also, consistent with some mild arthritis.  Continue with oral NSAIDs, topical Voltaren.  Ice knee after work.  Ideally can do some resistance with increased  activity.  Ideally, this will be a little bit more easier after his knee injection.  I am also going to do a steroid injection in his knee as well.  Aspiration/Injection Procedure Note Ava C Hollman 03-04-1970 Date of procedure: 12/20/2020  Procedure: Large Joint Aspiration / Injection of Knee, L Indications: Pain  Procedure Details Patient verbally consented to procedure. Risks, benefits, and alternatives explained. Sterilely prepped with Chloraprep. Ethyl cholride used for anesthesia. 9 cc Lidocaine 1% mixed with 1 mL of Kenalog 40 mg injected using the anteromedial approach without difficulty. No complications with procedure and tolerated well. Patient had decreased pain  post-injection. Medication: 1 mL of Kenalog 40 mg   Meds ordered this encounter  Medications   triamcinolone acetonide (KENALOG-40) injection 40 mg   There are no discontinued medications. Orders Placed This Encounter  Procedures   DG Knee 4 Views W/Patella Left    Follow-up: No follow-ups on file.  [employer] One speech-to-text software was used for transcription in this dictation.  Possible transcriptional errors can occur using Editor, commissioning.   Signed,  Maud Deed. Amaka Gluth, MD   Outpatient Encounter Medications as of 12/20/2020  Medication Sig   Accu-Chek FastClix Lancets MISC USE AS DIRECTED TO CHECK BLOOD SUGAR ONCE DAILY   atorvastatin (LIPITOR) 40 MG tablet Take 1 tablet (40 mg total) by mouth daily.   Blood Glucose Monitoring Suppl (ACCU-CHEK GUIDE) w/Device KIT 1 each by Does not apply route as needed. Use as directed to check blood sugar once daily   Cholecalciferol (VITAMIN D3) 50 MCG (2000 UT) capsule Take 1 capsule (2,000 Units total) by mouth daily.   dapagliflozin propanediol (FARXIGA) 5 MG TABS tablet Take 1 tablet (5 mg total) by mouth daily before breakfast.   glimepiride (AMARYL) 2 MG tablet Take 1 tablet (2 mg total) by mouth daily before breakfast.   glucose blood (ACCU-CHEK GUIDE) test strip USE AS INSTRUCTED TO CHECK BLOOD SUGAR ONCE DAILY   hydrOXYzine (ATARAX/VISTARIL) 25 MG tablet TAKE 1/2 TO 1 TABLET BY MOUTH 3 TIMES A DAY AS NEEDED FOR ITCHING (SEDATION PRECAUTIONS)   ibuprofen (ADVIL) 800 MG tablet Take 1 tablet (800 mg total) by mouth every 8 (eight) hours as needed for moderate pain.   lidocaine (LIDODERM) 5 % Place 1 patch onto the skin every 12 (twelve) hours. Remove & Discard patch within 12 hours or as directed by MD   metFORMIN (GLUCOPHAGE) 500 MG tablet Take 1 tablet (500 mg total) by mouth 2 (two) times daily with a meal.   methocarbamol (ROBAXIN) 500 MG tablet Take 1 tablet (500 mg total) by mouth 3 (three) times daily as needed for  muscle spasms (sedation precautions).   pantoprazole (PROTONIX) 40 MG tablet Take 1 tablet (40 mg total) by mouth daily as needed (heartburn).   tadalafil (CIALIS) 10 MG tablet Take 1 tablet (10 mg total) by mouth every other day as needed for erectile dysfunction.   triamcinolone cream (KENALOG) 0.1 % APPLY TOPICALLY 2 (TWO) TIMES DAILY AS NEEDED. MAX 10 DAYS AT A TIME   [EXPIRED] triamcinolone acetonide (KENALOG-40) injection 40 mg    No facility-administered encounter medications on file as of 12/20/2020.

## 2020-12-21 ENCOUNTER — Encounter: Payer: Self-pay | Admitting: Family Medicine

## 2021-01-05 ENCOUNTER — Encounter: Payer: Self-pay | Admitting: Gastroenterology

## 2021-02-02 ENCOUNTER — Ambulatory Visit (AMBULATORY_SURGERY_CENTER): Payer: 59 | Admitting: *Deleted

## 2021-02-02 ENCOUNTER — Other Ambulatory Visit: Payer: Self-pay

## 2021-02-02 VITALS — Ht 69.5 in | Wt 189.0 lb

## 2021-02-02 DIAGNOSIS — Z1211 Encounter for screening for malignant neoplasm of colon: Secondary | ICD-10-CM

## 2021-02-02 MED ORDER — PEG 3350-KCL-NA BICARB-NACL 420 G PO SOLR
4000.0000 mL | Freq: Once | ORAL | 0 refills | Status: AC
Start: 2021-02-02 — End: 2021-02-02

## 2021-02-02 NOTE — Progress Notes (Signed)

## 2021-02-09 HISTORY — PX: COLONOSCOPY: SHX174

## 2021-02-15 ENCOUNTER — Encounter: Payer: Self-pay | Admitting: Gastroenterology

## 2021-02-16 ENCOUNTER — Other Ambulatory Visit: Payer: Self-pay

## 2021-02-16 ENCOUNTER — Ambulatory Visit (AMBULATORY_SURGERY_CENTER): Payer: 59 | Admitting: Gastroenterology

## 2021-02-16 ENCOUNTER — Encounter: Payer: Self-pay | Admitting: Gastroenterology

## 2021-02-16 VITALS — BP 95/58 | HR 74 | Temp 97.1°F | Resp 12 | Ht 69.5 in | Wt 189.0 lb

## 2021-02-16 DIAGNOSIS — R195 Other fecal abnormalities: Secondary | ICD-10-CM | POA: Diagnosis present

## 2021-02-16 MED ORDER — SODIUM CHLORIDE 0.9 % IV SOLN: 500.0000 mL | Freq: Once | INTRAVENOUS | Status: DC

## 2021-02-16 NOTE — Progress Notes (Signed)
History and Physical:  This patient presents for endoscopic testing for: Encounter Diagnosis  Name Primary?   Heme positive stool Yes    Patient denies chronic abdominal pain, rectal bleeding, constipation or diarrhea.   ROS: Patient denies chest pain or shortness of breath   Past Medical History: Past Medical History:  Diagnosis Date   Diabetes mellitus type 2, controlled, without complications ([place]) 63/14/9702   History of malaria 1999   HLD (hyperlipidemia) 06/13/2013   Hyperlipidemia      Past Surgical History: Past Surgical History:  Procedure Laterality Date   COLONOSCOPY     10 years ago   KNEE ARTHROSCOPY Right 01/10/2000   ?ACL repair   KNEE ARTHROSCOPY WITH DRILLING/MICROFRACTURE Right 06/21/2016   Procedure: KNEE ARTHROSCOPY WITH DRILLING/MICROFRACTURE;  Surgeon: Leandrew Koyanagi, MD;  Location: [facility];  Service: Orthopedics;  Laterality: Right;   KNEE ARTHROSCOPY WITH MEDIAL MENISECTOMY Right 06/21/2016   Procedure: RIGHT KNEE ARTHROSCOPY WITH PARTIAL MEDIAL AND LATERAL MENISCECTOMIES, SYNOVECTOMY,  MICROFRACTURE, SUBCHONDROPLASTY;  Surgeon: Leandrew Koyanagi, MD;  Location: [facility];  Service: Orthopedics;  Laterality: Right;   KNEE ARTHROSCOPY WITH SUBCHONDROPLASTY Right 06/21/2016   Procedure: KNEE ARTHROSCOPY WITH SUBCHONDROPLASTY;  Surgeon: Leandrew Koyanagi, MD;  Location: [facility];  Service: Orthopedics;  Laterality: Right;    Allergies: Allergies  Allergen Reactions   Chloroquine Itching   Effexor [Venlafaxine] Other (See Comments)    Arm spasms, HA, ED   Hydrocodone Other (See Comments)    Headache   Percocet [Oxycodone-Acetaminophen] Other (See Comments)    Headache    Outpatient Meds: Current Outpatient Medications  Medication Sig Dispense Refill   Accu-Chek FastClix Lancets MISC USE AS DIRECTED TO CHECK BLOOD SUGAR ONCE DAILY 100 each 0   atorvastatin (LIPITOR) 40 MG tablet Take 1 tablet (40 mg  total) by mouth daily. 90 tablet 3   Blood Glucose Monitoring Suppl (ACCU-CHEK GUIDE) w/Device KIT 1 each by Does not apply route as needed. Use as directed to check blood sugar once daily 1 kit 0   Cholecalciferol (VITAMIN D3) 50 MCG (2000 UT) capsule Take 1 capsule (2,000 Units total) by mouth daily.     dapagliflozin propanediol (FARXIGA) 5 MG TABS tablet Take 1 tablet (5 mg total) by mouth daily before breakfast. 30 tablet 11   glucose blood (ACCU-CHEK GUIDE) test strip USE AS INSTRUCTED TO CHECK BLOOD SUGAR ONCE DAILY 50 strip 3   metFORMIN (GLUCOPHAGE) 500 MG tablet Take 1 tablet (500 mg total) by mouth 2 (two) times daily with a meal. 180 tablet 3   hydrOXYzine (ATARAX/VISTARIL) 25 MG tablet TAKE 1/2 TO 1 TABLET BY MOUTH 3 TIMES A DAY AS NEEDED FOR ITCHING (SEDATION PRECAUTIONS) 30 tablet 0   ibuprofen (ADVIL) 800 MG tablet Take 1 tablet (800 mg total) by mouth every 8 (eight) hours as needed for moderate pain. 30 tablet 0   lidocaine (LIDODERM) 5 % Place 1 patch onto the skin every 12 (twelve) hours. Remove & Discard patch within 12 hours or as directed by MD 10 patch 0   methocarbamol (ROBAXIN) 500 MG tablet Take 1 tablet (500 mg total) by mouth 3 (three) times daily as needed for muscle spasms (sedation precautions). 40 tablet 0   pantoprazole (PROTONIX) 40 MG tablet Take 1 tablet (40 mg total) by mouth daily as needed (heartburn). 30 tablet 3   tadalafil (CIALIS) 10 MG tablet Take 1 tablet (10 mg total) by mouth every other day as needed  for erectile dysfunction. (Patient not taking: Reported on 02/02/2021) 10 tablet 1   triamcinolone cream (KENALOG) 0.1 % APPLY TOPICALLY 2 (TWO) TIMES DAILY AS NEEDED. MAX 10 DAYS AT A TIME 80 g 0   Current Facility-Administered Medications  Medication Dose Route Frequency Provider Last Rate Last Admin   0.9 %  sodium chloride infusion  500 mL Intravenous Once Doran Stabler, MD           ___________________________________________________________________ Objective   Exam:  BP 121/82    Pulse 63    Temp (!) 97.1 F (36.2 C)    Resp 15    Ht 5' 9.5" (1.765 m)    Wt 189 lb (85.7 kg)    SpO2 100%    BMI 27.51 kg/m   CV: RRR without murmur, S1/S2 Resp: clear to auscultation bilaterally, normal RR and effort noted GI: soft, no tenderness, with active bowel sounds.   Assessment: Encounter Diagnosis  Name Primary?   Heme positive stool Yes     Plan: Colonoscopy  The benefits and risks of the planned procedure were described in detail with the patient or (when appropriate) their health care proxy.  Risks were outlined as including, but not limited to, bleeding, infection, perforation, adverse medication reaction leading to cardiac or pulmonary decompensation, pancreatitis (if ERCP).  The limitation of incomplete mucosal visualization was also discussed.  No guarantees or warranties were given.    The patient is appropriate for an endoscopic procedure in the ambulatory setting.   - Wilfrid Lund, MD

## 2021-02-16 NOTE — Progress Notes (Signed)
Pt's states no medical or surgical changes since previsit or office visit.   VS taken by CW 

## 2021-02-16 NOTE — Op Note (Signed)
[place] Patient Name: Gary Long Procedure Date: 02/16/2021 7:50 AM MRN: [id number] Endoscopist: [place]. Loletha Carrow , MD Age: 51 Referring MD:  Date of Birth: December 24, 1970 Gender: Male Account #: [id number] Procedure:                Colonoscopy Indications:              Heme positive stool (no symptoms) Medicines:                Monitored Anesthesia Care Procedure:                Pre-Anesthesia Assessment:                           - Prior to the procedure, a History and Physical                            was performed, and patient medications and                            allergies were reviewed. The patient's tolerance of                            previous anesthesia was also reviewed. The risks                            and benefits of the procedure and the sedation                            options and risks were discussed with the patient.                            All questions were answered, and informed consent                            was obtained. Prior Anticoagulants: The patient has                            taken no previous anticoagulant or antiplatelet                            agents. ASA Grade Assessment: II - A patient with                            mild systemic disease. After reviewing the risks                            and benefits, the patient was deemed in                            satisfactory condition to undergo the procedure.                           After obtaining informed consent, the colonoscope  was passed under direct vision. Throughout the                            procedure, the patient's blood pressure, pulse, and                            oxygen saturations were monitored continuously. The                            Colonoscope was introduced through the anus and                            advanced to the the terminal ileum, with                            identification of the appendiceal  orifice and IC                            valve. The colonoscopy was performed without                            difficulty. The patient tolerated the procedure                            well. The quality of the bowel preparation was                            excellent. The terminal ileum, ileocecal valve,                            appendiceal orifice, and rectum were photographed.                            The bowel preparation used was GoLYTELY. Scope In: 8:02:12 AM Scope Out: 8:14:45 AM Scope Withdrawal Time: 0 hours 10 minutes 25 seconds  Total Procedure Duration: 0 hours 12 minutes 33 seconds  Findings:                 The perianal and digital rectal examinations were                            normal.                           The terminal ileum appeared normal.                           The entire examined colon appeared normal on direct                            and retroflexion views. Complications:            No immediate complications. Estimated Blood Loss:     Estimated blood loss: none. Impression:               - The examined portion of the ileum  was normal.                           - The entire examined colon is normal on direct and                            retroflexion views.                           - No specimens collected. Recommendation:           - Patient has a contact number available for                            emergencies. The signs and symptoms of potential                            delayed complications were discussed with the                            patient. Return to normal activities tomorrow.                            Written discharge instructions were provided to the                            patient.                           - Resume previous diet.                           - Continue present medications.                           - Repeat colonoscopy in 10 years for screening                            purposes. [name] L. Loletha Carrow,  MD 02/16/2021 8:17:52 AM This report has been signed electronically.

## 2021-02-16 NOTE — Progress Notes (Signed)
Report to PACU, RN, vss, BBS= Clear.  

## 2021-02-16 NOTE — Patient Instructions (Signed)
Repeat colonoscopy in 10 years for screening purpose   YOU HAD AN ENDOSCOPIC PROCEDURE TODAY AT THE [facility]:   Refer to the procedure report that was given to you for any specific questions about what was found during the examination.  If the procedure report does not answer your questions, please call your gastroenterologist to clarify.  If you requested that your care partner not be given the details of your procedure findings, then the procedure report has been included in a sealed envelope for you to review at your convenience later.  YOU SHOULD EXPECT: Some feelings of bloating in the abdomen. Passage of more gas than usual.  Walking can help get rid of the air that was put into your GI tract during the procedure and reduce the bloating. If you had a lower endoscopy (such as a colonoscopy or flexible sigmoidoscopy) you may notice spotting of blood in your stool or on the toilet paper. If you underwent a bowel prep for your procedure, you may not have a normal bowel movement for a few days.  Please Note:  You might notice some irritation and congestion in your nose or some drainage.  This is from the oxygen used during your procedure.  There is no need for concern and it should clear up in a day or so.  SYMPTOMS TO REPORT IMMEDIATELY:  Following lower endoscopy (colonoscopy or flexible sigmoidoscopy):  Excessive amounts of blood in the stool  Significant tenderness or worsening of abdominal pains  Swelling of the abdomen that is new, acute  Fever of 100F or higher  For urgent or emergent issues, a gastroenterologist can be reached at any hour by calling [phone]. Do not use MyChart messaging for urgent concerns.    DIET:  We do recommend a small meal at first, but then you may proceed to your regular diet.  Drink plenty of fluids but you should avoid alcoholic beverages for 24 hours.  ACTIVITY:  You should plan to take it easy for the rest of today and you should NOT  DRIVE or use heavy machinery until tomorrow (because of the sedation medicines used during the test).    FOLLOW UP: Our staff will call the number listed on your records 48-72 hours following your procedure to check on you and address any questions or concerns that you may have regarding the information given to you following your procedure. If we do not reach you, we will leave a message.  We will attempt to reach you two times.  During this call, we will ask if you have developed any symptoms of COVID 19. If you develop any symptoms (ie: fever, flu-like symptoms, shortness of breath, cough etc.) before then, please call [phone].  If you test positive for Covid 19 in the 2 weeks post procedure, please call and report this information to [place].    If any biopsies were taken you will be contacted by phone or by letter within the next 1-3 weeks.  Please call us at [phone] if you have not heard about the biopsies in 3 weeks.    SIGNATURES/CONFIDENTIALITY: You and/or your care partner have signed paperwork which will be entered into your electronic medical record.  These signatures attest to the fact that that the information above on your After Visit Summary has been reviewed and is understood.  Full responsibility of the confidentiality of this discharge information lies with you and/or your care-partner.

## 2021-02-17 ENCOUNTER — Encounter: Payer: Self-pay | Admitting: Family Medicine

## 2021-02-18 ENCOUNTER — Telehealth: Payer: Self-pay

## 2021-02-18 NOTE — Telephone Encounter (Signed)
Left message on answering machine. 

## 2021-03-01 ENCOUNTER — Other Ambulatory Visit: Payer: Self-pay | Admitting: Family Medicine

## 2021-03-01 NOTE — Telephone Encounter (Signed)
Received message from pharmacy stating pt's ins co does not cover Accu-Chek Guide.  They are asking for rx for alternative.  Spoke with pt relaying info above.  Suggested pt contact ins co to ask what brand and model they cover, then call to let us know.  Pt verbalizes understanding and will let us know.

## 2021-05-18 ENCOUNTER — Ambulatory Visit: Payer: 59 | Admitting: Family Medicine

## 2021-05-25 ENCOUNTER — Encounter: Payer: Self-pay | Admitting: Family Medicine

## 2021-05-25 ENCOUNTER — Ambulatory Visit (INDEPENDENT_AMBULATORY_CARE_PROVIDER_SITE_OTHER): Payer: 59 | Admitting: Family Medicine

## 2021-05-25 VITALS — BP 124/82 | HR 75 | Temp 97.5°F | Ht 69.5 in | Wt 184.0 lb

## 2021-05-25 DIAGNOSIS — E118 Type 2 diabetes mellitus with unspecified complications: Secondary | ICD-10-CM

## 2021-05-25 DIAGNOSIS — E1169 Type 2 diabetes mellitus with other specified complication: Secondary | ICD-10-CM | POA: Diagnosis not present

## 2021-05-25 DIAGNOSIS — Z7184 Encounter for health counseling related to travel: Secondary | ICD-10-CM | POA: Diagnosis not present

## 2021-05-25 LAB — POCT GLYCOSYLATED HEMOGLOBIN (HGB A1C): Hemoglobin A1C: 6.8 % — AB (ref 4.0–5.6)

## 2021-05-25 MED ORDER — METHOCARBAMOL 500 MG PO TABS: 500.0000 mg | ORAL_TABLET | Freq: Three times a day (TID) | ORAL | 0 refills | Status: DC | PRN

## 2021-05-25 MED ORDER — DOXYCYCLINE HYCLATE 100 MG PO TABS
100.0000 mg | ORAL_TABLET | Freq: Every day | ORAL | 0 refills | Status: DC
Start: 2021-05-25 — End: 2021-12-07

## 2021-05-25 NOTE — Patient Instructions (Addendum)
I think you may be due for eye exam in 08/2021.  ?Doxycycline sent to pharmacy to take for malaria prevention.  ?Consider typhoid and yellow fever vaccines - you can schedule appointment with travel clinic at local health department or our ID clinic in [place] [phone]  ?

## 2021-05-25 NOTE — Assessment & Plan Note (Signed)
Upcoming 3 wk trip to [place].  ?Reviewed CDC travel recommendations. ?UTD Tdap 2014.  ?Rx malaria 137m daily to start 1d prior to trip and continue for 4 wks after trip (#60).  ?Discussed typhoid and yellow fever vaccination - advised he check with local travel clinic for these.  ?Pt thinks he's UTD on childhood immunizations including polio MMR and Hep A/B.  ?

## 2021-05-25 NOTE — Progress Notes (Signed)
? ? Patient ID: Gary Long, male    DOB: 1971-01-06, 51 y.o.   MRN: [id number] ? ?This visit was conducted in person. ? ?BP 124/82   Pulse 75   Temp (!) 97.5 ?F (36.4 ?C) (Temporal)   Ht 5' 9.5" (1.765 m)   Wt 184 lb (83.5 kg)   SpO2 98%   BMI 26.78 kg/m?   ? ?CC: DM f/u visit  ?Subjective:  ? ?HPI: ?Gary Long is a 51 y.o. male presenting on 05/25/2021 for Diabetes (Here for f/u.) ? ? ?DM - does regularly check sugars fasting daily - 118-160s. Compliant with antihyperglycemic regimen which includes: metformin 546m bid and farxiga 571mdaily. Tolerating meds well without GI symptoms, UTI symptoms, yeast infections. Denies low sugars or hypoglycemic symptoms. Denies paresthesias, blurry vision. Last diabetic eye exam 08/2020. Glucometer brand: accuchek. Last foot exam: 2020 - DUE. DSME: 2019 at [facility] just one class. ?Lab Results  ?Component Value Date  ? HGBA1C 6.8 (A) 05/25/2021  ? ?Diabetic Foot Exam - Simple   ?Simple Foot Form ?Diabetic Foot exam was performed with the following findings: Yes 05/25/2021 11:39 AM  ?Visual Inspection ?No deformities, no ulcerations, no other skin breakdown bilaterally: Yes ?Sensation Testing ?Intact to touch and monofilament testing bilaterally: Yes ?Pulse Check ?Posterior Tibialis and Dorsalis pulse intact bilaterally: Yes ?Comments ?  ? ?Lab Results  ?Component Value Date  ? MICROALBUR <0.7 09/22/2019  ?  ? ?Meal routine on work days:  ?Small breakfast 7am ?Larger lunch 12:30pm  ?Small dinner 9am  ? ?Meal routine on days off: ?Breakfast 9-10am - skips breakfast ?Lung 2pm ?Dinner 7pm ? ?Works 12 hour shifts, 3-4 days/wk.  ?Bedtime ~10pm ? ?[place] Requests doxy for malaria prevention.  ?Tdap 03/2012. ?He thinks UTD on childhood vaccines.  ?   ? ?Relevant past medical, surgical, family and social history reviewed and updated as indicated. Interim medical history since our last visit reviewed. ?Allergies and medications reviewed and  updated. ?Outpatient Medications Prior to Visit  ?Medication Sig Dispense Refill  ? Accu-Chek FastClix Lancets MISC USE AS DIRECTED TO CHECK BLOOD SUGAR ONCE DAILY 100 each 0  ? ACCU-CHEK GUIDE test strip USE AS INSTRUCTED TO CHECK BLOOD SUGAR ONCE DAILY 50 strip 3  ? atorvastatin (LIPITOR) 40 MG tablet Take 1 tablet (40 mg total) by mouth daily. 90 tablet 3  ? Blood Glucose Monitoring Suppl (ACCU-CHEK GUIDE) w/Device KIT 1 each by Does not apply route as needed. Use as directed to check blood sugar once daily 1 kit 0  ? Cholecalciferol (VITAMIN D3) 50 MCG (2000 UT) capsule Take 1 capsule (2,000 Units total) by mouth daily.    ? dapagliflozin propanediol (FARXIGA) 5 MG TABS tablet Take 1 tablet (5 mg total) by mouth daily before breakfast. 30 tablet 11  ? hydrOXYzine (ATARAX/VISTARIL) 25 MG tablet TAKE 1/2 TO 1 TABLET BY MOUTH 3 TIMES A DAY AS NEEDED FOR ITCHING (SEDATION PRECAUTIONS) 30 tablet 0  ? ibuprofen (ADVIL) 800 MG tablet Take 1 tablet (800 mg total) by mouth every 8 (eight) hours as needed for moderate pain. 30 tablet 0  ? lidocaine (LIDODERM) 5 % Place 1 patch onto the skin every 12 (twelve) hours. Remove & Discard patch within 12 hours or as directed by MD 10 patch 0  ? metFORMIN (GLUCOPHAGE) 500 MG tablet Take 1 tablet (500 mg total) by mouth 2 (two) times daily with a meal. 180 tablet 3  ? pantoprazole (PROTONIX) 40  MG tablet Take 1 tablet (40 mg total) by mouth daily as needed (heartburn). 30 tablet 3  ? triamcinolone cream (KENALOG) 0.1 % APPLY TOPICALLY 2 (TWO) TIMES DAILY AS NEEDED. MAX 10 DAYS AT A TIME 80 g 0  ? methocarbamol (ROBAXIN) 500 MG tablet Take 1 tablet (500 mg total) by mouth 3 (three) times daily as needed for muscle spasms (sedation precautions). 40 tablet 0  ? tadalafil (CIALIS) 10 MG tablet Take 1 tablet (10 mg total) by mouth every other day as needed for erectile dysfunction. 10 tablet 1  ? ?No facility-administered medications prior to visit.  ?  ? ?Per HPI unless specifically  indicated in ROS section below ?Review of Systems ? ?Objective:  ?BP 124/82   Pulse 75   Temp (!) 97.5 ?F (36.4 ?C) (Temporal)   Ht 5' 9.5" (1.765 m)   Wt 184 lb (83.5 kg)   SpO2 98%   BMI 26.78 kg/m?   ?Wt Readings from Last 3 Encounters:  ?05/25/21 184 lb (83.5 kg)  ?02/16/21 189 lb (85.7 kg)  ?02/02/21 189 lb (85.7 kg)  ?  ?  ?Physical Exam ?Vitals and nursing note reviewed.  ?Constitutional:   ?   Appearance: Normal appearance. He is not ill-appearing.  ?Eyes:  ?   Extraocular Movements: Extraocular movements intact.  ?   Conjunctiva/sclera: Conjunctivae normal.  ?   Pupils: Pupils are equal, round, and reactive to light.  ?Cardiovascular:  ?   Rate and Rhythm: Normal rate and regular rhythm.  ?   Pulses: Normal pulses.  ?   Heart sounds: Normal heart sounds. No murmur heard. ?Pulmonary:  ?   Effort: Pulmonary effort is normal. No respiratory distress.  ?   Breath sounds: Normal breath sounds. No wheezing, rhonchi or rales.  ?Musculoskeletal:  ?   Right lower leg: No edema.  ?   Left lower leg: No edema.  ?   Comments: See HPI for foot exam if done  ?Skin: ?   General: Skin is warm and dry.  ?   Findings: No rash.  ?Neurological:  ?   Mental Status: He is alert.  ?Psychiatric:     ?   Mood and Affect: Mood normal.     ?   Behavior: Behavior normal.  ? ?   ?Results for orders placed or performed in visit on 05/25/21  ?POCT glycosylated hemoglobin (Hb A1C)  ?Result Value Ref Range  ? Hemoglobin A1C 6.8 (A) 4.0 - 5.6 %  ? HbA1c POC (<> result, manual entry)    ? HbA1c, POC (prediabetic range)    ? HbA1c, POC (controlled diabetic range)    ? ? ?Assessment & Plan:  ? ?Problem List Items Addressed This Visit   ? ? Type 2 diabetes mellitus with other specified complication ([place]) - Primary  ?  Chronic, improved control.  ?Continue metformin and Farxiga. ?Foot exam today. ? ?  ?  ? Travel advice encounter  ?  Upcoming 3 wk trip to [place].  ?Reviewed CDC travel recommendations. ?UTD Tdap 2014.  ?Rx malaria 175m  daily to start 1d prior to trip and continue for 4 wks after trip (#60).  ?Discussed typhoid and yellow fever vaccination - advised he check with local travel clinic for these.  ?Pt thinks he's UTD on childhood immunizations including polio MMR and Hep A/B.  ? ?  ?  ?  ? ?Meds ordered this encounter  ?Medications  ? methocarbamol (ROBAXIN) 500 MG tablet  ?  Sig: Take  1 tablet (500 mg total) by mouth 3 (three) times daily as needed for muscle spasms (sedation precautions).  ?  Dispense:  40 tablet  ?  Refill:  0  ? doxycycline (VIBRA-TABS) 100 MG tablet  ?  Sig: Take 1 tablet (100 mg total) by mouth daily.  ?  Dispense:  60 tablet  ?  Refill:  0  ?  For travel  ? ?Orders Placed This Encounter  ?Procedures  ? POCT glycosylated hemoglobin (Hb A1C)  ? ? ? ?Patient Instructions  ?I think you may be due for eye exam in 08/2021.  ?Doxycycline sent to pharmacy to take for malaria prevention.  ?Consider typhoid and yellow fever vaccines - you can schedule appointment with travel clinic at local health department or our ID clinic in [place] [phone]  ? ?Follow up plan: ?Return in about 6 months (around 11/25/2021) for annual exam, prior fasting for blood work. ? ?Ria Bush, MD   ?

## 2021-05-25 NOTE — Assessment & Plan Note (Signed)
Chronic, improved control.  ?Continue metformin and Farxiga. ?Foot exam today. ?

## 2021-06-29 ENCOUNTER — Telehealth: Payer: Self-pay

## 2021-06-29 DIAGNOSIS — E1169 Type 2 diabetes mellitus with other specified complication: Secondary | ICD-10-CM

## 2021-06-29 MED ORDER — ACCU-CHEK GUIDE W/DEVICE KIT: 1.0000 | PACK | 0 refills | Status: AC | PRN

## 2021-06-29 NOTE — Telephone Encounter (Signed)
Received rx request for Accu-Chek Guide meter.  E-scribed rx.

## 2021-07-20 ENCOUNTER — Other Ambulatory Visit: Payer: Self-pay | Admitting: Family Medicine

## 2021-07-20 NOTE — Telephone Encounter (Signed)
ERx 

## 2021-07-20 NOTE — Telephone Encounter (Signed)
Ibuprofen last filled:  10/22/20, #30 Robaxin last filled:  05/25/21, #40 Last OV:  05/25/21, f/u Next OV: none

## 2021-07-20 NOTE — Telephone Encounter (Signed)
Patient called to follow up on this and said that hes having back and shoulder pain and that he thinks the ibuprofen will help him. Call back if needed is [phone]

## 2021-07-22 NOTE — Telephone Encounter (Signed)
Lvm to make patient aware.

## 2021-08-15 ENCOUNTER — Other Ambulatory Visit: Payer: Self-pay | Admitting: Family Medicine

## 2021-08-16 NOTE — Telephone Encounter (Signed)
Left a message on voicemail for patient to call the office back. When patient calls back we need to find out if he requested the refill for Doxycycline.

## 2021-08-17 NOTE — Telephone Encounter (Signed)
Lvm asking pt to call back.  Need to know reason for doxycyline (antibiotic) request.  

## 2021-08-18 NOTE — Telephone Encounter (Signed)
Lvm asking pt to call back.  Need to know reason for doxycyline (antibiotic) request.  

## 2021-08-23 NOTE — Telephone Encounter (Signed)
Lvm asking pt to call back.  Need to know reason for doxycyline (antibiotic) request.  

## 2021-08-25 NOTE — Telephone Encounter (Signed)
Lvm asking pt to call back.  Need to know reason for doxycyline (antibiotic) request.

## 2021-08-26 NOTE — Telephone Encounter (Signed)
Spoke with pt asking reason for request of abx.  States he did not request med.  Denied request.

## 2021-11-21 ENCOUNTER — Other Ambulatory Visit: Payer: Self-pay | Admitting: Family Medicine

## 2021-11-21 DIAGNOSIS — E1169 Type 2 diabetes mellitus with other specified complication: Secondary | ICD-10-CM

## 2021-11-27 ENCOUNTER — Other Ambulatory Visit: Payer: Self-pay | Admitting: Family Medicine

## 2021-11-27 DIAGNOSIS — E1169 Type 2 diabetes mellitus with other specified complication: Secondary | ICD-10-CM

## 2021-11-27 DIAGNOSIS — D696 Thrombocytopenia, unspecified: Secondary | ICD-10-CM

## 2021-11-27 DIAGNOSIS — E559 Vitamin D deficiency, unspecified: Secondary | ICD-10-CM | POA: Insufficient documentation

## 2021-11-27 DIAGNOSIS — Z125 Encounter for screening for malignant neoplasm of prostate: Secondary | ICD-10-CM

## 2021-11-28 ENCOUNTER — Other Ambulatory Visit (INDEPENDENT_AMBULATORY_CARE_PROVIDER_SITE_OTHER): Payer: Self-pay

## 2021-11-28 DIAGNOSIS — E559 Vitamin D deficiency, unspecified: Secondary | ICD-10-CM

## 2021-11-28 DIAGNOSIS — Z125 Encounter for screening for malignant neoplasm of prostate: Secondary | ICD-10-CM

## 2021-11-28 DIAGNOSIS — E785 Hyperlipidemia, unspecified: Secondary | ICD-10-CM

## 2021-11-28 DIAGNOSIS — D696 Thrombocytopenia, unspecified: Secondary | ICD-10-CM

## 2021-11-28 DIAGNOSIS — E1169 Type 2 diabetes mellitus with other specified complication: Secondary | ICD-10-CM

## 2021-11-28 LAB — COMPREHENSIVE METABOLIC PANEL
ALT: 12 U/L (ref 0–53)
AST: 14 U/L (ref 0–37)
Albumin: 4.1 g/dL (ref 3.5–5.2)
Alkaline Phosphatase: 68 U/L (ref 39–117)
BUN: 17 mg/dL (ref 6–23)
CO2: 26 mEq/L (ref 19–32)
Calcium: 8.9 mg/dL (ref 8.4–10.5)
Chloride: 103 mEq/L (ref 96–112)
Creatinine, Ser: 0.99 mg/dL (ref 0.40–1.50)
GFR: 88.31 mL/min (ref >60.00)
Glucose, Bld: 96 mg/dL (ref 70–99)
Potassium: 4.5 mEq/L (ref 3.5–5.1)
Sodium: 138 mEq/L (ref 135–145)
Total Bilirubin: 0.4 mg/dL (ref 0.2–1.2)
Total Protein: 6.9 g/dL (ref 6.0–8.3)

## 2021-11-28 LAB — LIPID PANEL
Cholesterol: 203 mg/dL — ABNORMAL HIGH (ref 0–200)
HDL: 66.6 mg/dL (ref >39.00)
LDL Cholesterol: 122 mg/dL — ABNORMAL HIGH (ref 0–99)
NonHDL: 136.36
Total CHOL/HDL Ratio: 3
Triglycerides: 74 mg/dL (ref 0.0–149.0)
VLDL: 14.8 mg/dL (ref 0.0–40.0)

## 2021-11-28 LAB — VITAMIN D 25 HYDROXY (VIT D DEFICIENCY, FRACTURES): VITD: 30.74 ng/mL (ref 30.00–100.00)

## 2021-11-28 LAB — HEMOGLOBIN A1C: Hgb A1c MFr Bld: 7.1 % — ABNORMAL HIGH (ref 4.6–6.5)

## 2021-11-28 LAB — MICROALBUMIN / CREATININE URINE RATIO
Creatinine,U: 115.8 mg/dL
Microalb Creat Ratio: 0.6 mg/g (ref 0.0–30.0)
Microalb, Ur: <0.7 mg/dL (ref 0.0–1.9)

## 2021-11-28 LAB — PSA: PSA: 2.2 ng/mL (ref 0.10–4.00)

## 2021-12-02 ENCOUNTER — Other Ambulatory Visit: Payer: Self-pay | Admitting: Family Medicine

## 2021-12-03 ENCOUNTER — Other Ambulatory Visit: Payer: Self-pay | Admitting: Family Medicine

## 2021-12-06 LAB — CBC WITH DIFFERENTIAL/PLATELET
Absolute Monocytes: 360 cells/uL (ref 200–950)
Basophils Absolute: 19 cells/uL (ref 0–200)
Basophils Relative: 0.4 %
Eosinophils Absolute: 82 cells/uL (ref 15–500)
Eosinophils Relative: 1.7 %
HCT: 44.1 % (ref 38.5–50.0)
Hemoglobin: 14.1 g/dL (ref 13.2–17.1)
Lymphs Abs: 2352 cells/uL (ref 850–3900)
MCH: 28.1 pg (ref 27.0–33.0)
MCHC: 32 g/dL (ref 32.0–36.0)
MCV: 87.8 fL (ref 80.0–100.0)
MPV: 13.1 fL — ABNORMAL HIGH (ref 7.5–12.5)
Monocytes Relative: 7.5 %
Neutro Abs: 1987 cells/uL (ref 1500–7800)
Neutrophils Relative %: 41.4 %
Platelets: 125 10*3/uL — ABNORMAL LOW (ref 140–400)
RBC: 5.02 10*6/uL (ref 4.20–5.80)
RDW: 14.7 % (ref 11.0–15.0)
Total Lymphocyte: 49 %
WBC: 4.8 10*3/uL (ref 3.8–10.8)

## 2021-12-06 LAB — TEST AUTHORIZATION

## 2021-12-06 LAB — PATHOLOGIST SMEAR REVIEW

## 2021-12-07 ENCOUNTER — Encounter: Payer: Self-pay | Admitting: Family Medicine

## 2021-12-07 ENCOUNTER — Ambulatory Visit (INDEPENDENT_AMBULATORY_CARE_PROVIDER_SITE_OTHER): Payer: 59 | Admitting: Family Medicine

## 2021-12-07 VITALS — BP 126/80 | HR 72 | Temp 97.9°F | Ht 69.0 in | Wt 178.6 lb

## 2021-12-07 DIAGNOSIS — E1169 Type 2 diabetes mellitus with other specified complication: Secondary | ICD-10-CM

## 2021-12-07 DIAGNOSIS — Z0001 Encounter for general adult medical examination with abnormal findings: Secondary | ICD-10-CM

## 2021-12-07 DIAGNOSIS — Z23 Encounter for immunization: Secondary | ICD-10-CM | POA: Diagnosis not present

## 2021-12-07 DIAGNOSIS — K219 Gastro-esophageal reflux disease without esophagitis: Secondary | ICD-10-CM

## 2021-12-07 DIAGNOSIS — B999 Unspecified infectious disease: Secondary | ICD-10-CM | POA: Diagnosis not present

## 2021-12-07 DIAGNOSIS — M25511 Pain in right shoulder: Secondary | ICD-10-CM

## 2021-12-07 DIAGNOSIS — D696 Thrombocytopenia, unspecified: Secondary | ICD-10-CM

## 2021-12-07 DIAGNOSIS — E559 Vitamin D deficiency, unspecified: Secondary | ICD-10-CM

## 2021-12-07 DIAGNOSIS — Z7184 Encounter for health counseling related to travel: Secondary | ICD-10-CM

## 2021-12-07 DIAGNOSIS — E785 Hyperlipidemia, unspecified: Secondary | ICD-10-CM

## 2021-12-07 LAB — POC URINALSYSI DIPSTICK (AUTOMATED)
Bilirubin, UA: NEGATIVE
Blood, UA: NEGATIVE
Glucose, UA: POSITIVE — AB
Ketones, UA: NEGATIVE
Leukocytes, UA: NEGATIVE
Nitrite, UA: NEGATIVE
Protein, UA: NEGATIVE
Spec Grav, UA: >=1.03 — AB (ref 1.010–1.025)
Urobilinogen, UA: 0.2 E.U./dL
pH, UA: 5.5 (ref 5.0–8.0)

## 2021-12-07 MED ORDER — DOXYCYCLINE HYCLATE 100 MG PO TABS: 100.0000 mg | ORAL_TABLET | Freq: Every day | ORAL | 0 refills | Status: DC

## 2021-12-07 MED ORDER — IBUPROFEN 800 MG PO TABS: 800.0000 mg | ORAL_TABLET | Freq: Three times a day (TID) | ORAL | 0 refills | Status: DC | PRN

## 2021-12-07 MED ORDER — FLUCONAZOLE 150 MG PO TABS: 150.0000 mg | ORAL_TABLET | Freq: Once | ORAL | 0 refills | Status: AC

## 2021-12-07 NOTE — Progress Notes (Addendum)
Patient ID: Gary Long, male    DOB: Aug 26, 1970, 51 y.o.   MRN: [id number]  This visit was conducted in person.  BP 126/80   Pulse 72   Temp 97.9 F (36.6 C) (Temporal)   Ht _0  (1.753 m)   Wt 178 lb 9.6 oz (81 kg)   SpO2 97%   BMI 26.37 kg/m    CC: CPE Subjective:   HPI: Gary Long is a 51 y.o. male presenting on 12/07/2021 for Annual Exam   DM - continues farxiga 109m daily, metformin 5054mBID.  Wife has ben having recurrent yeast infections as well as hospitalized with PID. Her doctors wonder if he is carrier .  R shoulder pain for 10 days - has been treating with ibuprofen and robaxin without benefit. Denies inciting trauma/injury or falls.   Requests doxycycline refilled for malaria ppx for upcoming travel to [place] 2 [place].  Tdap 03/2012.  Thinks UTD childhood vaccinations.    Preventative: COLONOSCOPY 02/2021 - WNL, done for pos iFOB, rpt 10 yrs ([facility]) Prostate cancer screen - discussed, check yearly. No fmhx.  Lung cancer screening - not eligible  Flu shot yearly COVID vaccine - Moderna 01/2019, 02/2019, no booster Pneumovax 03/2012 Tetanus - 03/2012 Shingrix - discussed.  Seat belt use discussed  Sunscreen use discussed. No changing moles on skin.  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 Interim medical history since our last visit reviewed. Allergies and medications reviewed and updated. Outpatient Medications Prior to Visit  Medication Sig Dispense Refill   Accu-Chek FastClix Lancets MISC USE AS DIRECTED TO CHECK BLOOD SUGAR ONCE DAILY 100 each 0   Blood Glucose  Monitoring Suppl (ACCU-CHEK GUIDE) w/Device KIT 1 each by Does not apply route as needed. Use as directed to check blood sugar once daily 1 kit 0   Cholecalciferol (VITAMIN D3) 50 MCG (2000 UT) capsule Take 1 capsule (2,000 Units total) by mouth daily.     glucose blood (ACCU-CHEK GUIDE) test strip USE AS DIRECTED TO CHECK BLOOD SUGAR ONCE DAILY 100 strip 0   hydrOXYzine (ATARAX/VISTARIL) 25 MG tablet TAKE 1/2 TO 1 TABLET BY MOUTH 3 TIMES A DAY AS NEEDED FOR ITCHING (SEDATION PRECAUTIONS) 30 tablet 0   methocarbamol (ROBAXIN) 500 MG tablet TAKE 1 TABLET BY MOUTH THREE TIMES A DAY AS NEEDED FOR MUSCLE SPASMS 40 tablet 0   triamcinolone cream (KENALOG) 0.1 % APPLY TOPICALLY 2 (TWO) TIMES DAILY AS NEEDED. MAX 10 DAYS AT A TIME 80 g 0   atorvastatin (LIPITOR) 40 MG tablet TAKE 1 TABLET BY MOUTH EVERY DAY 90 tablet 0   dapagliflozin propanediol (FARXIGA) 5 MG TABS tablet TAKE 1 TABLET BY MOUTH DAILY BEFORE BREAKFAST. 30 tablet 0   doxycycline (VIBRA-TABS) 100 MG tablet Take 1 tablet (100 mg total) by mouth daily. 60 tablet 0   ibuprofen (ADVIL) 800 MG tablet TAKE 1 TABLET BY MOUTH EVERY 8 HOURS AS NEEDED FOR MODERATE PAIN. 30 tablet 0   metFORMIN (GLUCOPHAGE) 500 MG tablet Take 1 tablet (500 mg total) by mouth 2 (two) times daily with a meal. 180 tablet  3   pantoprazole (PROTONIX) 40 MG tablet Take 1 tablet (40 mg total) by mouth daily as needed (heartburn). 30 tablet 3   No facility-administered medications prior to visit.     Per HPI unless specifically indicated in ROS section below Review of Systems  Constitutional:  Negative for activity change, appetite change, chills, fatigue, fever and unexpected weight change.  HENT:  Negative for hearing loss.   Eyes:  Negative for visual disturbance.  Respiratory:  Negative for cough, chest tightness, shortness of breath and wheezing.   Cardiovascular:  Negative for chest pain, palpitations and leg swelling.  Gastrointestinal:  Negative for abdominal  distention, abdominal pain, blood in stool, constipation, diarrhea, nausea and vomiting.  Genitourinary:  Negative for difficulty urinating, dysuria, frequency, hematuria and urgency.  Musculoskeletal:  Negative for arthralgias, myalgias and neck pain.  Skin:  Negative for rash.  Neurological:  Negative for dizziness, seizures, syncope and headaches.  Hematological:  Negative for adenopathy. Does not bruise/bleed easily.  Psychiatric/Behavioral:  Negative for dysphoric mood. The patient is not nervous/anxious.     Objective:  BP 126/80   Pulse 72   Temp 97.9 F (36.6 C) (Temporal)   Ht _0  (1.753 m)   Wt 178 lb 9.6 oz (81 kg)   SpO2 97%   BMI 26.37 kg/m   Wt Readings from Last 3 Encounters:  12/07/21 178 lb 9.6 oz (81 kg)  05/25/21 184 lb (83.5 kg)  02/16/21 189 lb (85.7 kg)      Physical Exam Vitals and nursing note reviewed.  Constitutional:      General: He is not in acute distress.    Appearance: Normal appearance. He is well-developed. He is not ill-appearing.  HENT:     Head: Normocephalic and atraumatic.     Right Ear: Hearing, tympanic membrane, ear canal and external ear normal.     Left Ear: Hearing, tympanic membrane, ear canal and external ear normal.     Nose: Nose normal.     Mouth/Throat:     Mouth: Mucous membranes are moist.     Pharynx: Oropharynx is clear. No oropharyngeal exudate or posterior oropharyngeal erythema.  Eyes:     General: No scleral icterus.    Extraocular Movements: Extraocular movements intact.     Conjunctiva/sclera: Conjunctivae normal.     Pupils: Pupils are equal, round, and reactive to light.  Neck:     Thyroid: No thyroid mass or thyromegaly.  Cardiovascular:     Rate and Rhythm: Normal rate and regular rhythm.     Pulses: Normal pulses.          Radial pulses are 2+ on the right side and 2+ on the left side.     Heart sounds: Normal heart sounds. No murmur heard. Pulmonary:     Effort: Pulmonary effort is normal. No  respiratory distress.     Breath sounds: Normal breath sounds. No wheezing, rhonchi or rales.  Abdominal:     General: Bowel sounds are normal. There is no distension.     Palpations: Abdomen is soft. There is no mass.     Tenderness: There is no abdominal tenderness. There is no guarding or rebound.     Hernia: No hernia is present.  Musculoskeletal:        General: Tenderness present. Normal range of motion.     Cervical back: Normal range of motion and neck supple.     Right lower leg: No edema.     Left lower leg:  No edema.     Comments:  L shoulder WNL R shoulder exam: No deformity of shoulders on inspection. Discomfort to palpation posterior shoulder and lateral deltoid FROM in abduction and forward flexion. No pain or weakness with testing SITS in ext/int rotation. No pain with empty can sign. Discomfort with Speed test. Pain with impingement test. No significant pain with rotation of humeral head in [place] joint.   Lymphadenopathy:     Cervical: No cervical adenopathy.  Skin:    General: Skin is warm and dry.     Findings: No rash.  Neurological:     General: No focal deficit present.     Mental Status: He is alert and oriented to person, place, and time.  Psychiatric:        Mood and Affect: Mood normal.        Behavior: Behavior normal.        Thought Content: Thought content normal.        Judgment: Judgment normal.       Results for orders placed or performed in visit on 12/07/21  POCT Urinalysis Dipstick (Automated)  Result Value Ref Range   Color, UA yellow    Clarity, UA clear    Glucose, UA Positive (A) Negative   Bilirubin, UA negative    Ketones, UA negative    Spec Grav, UA >=1.030 (A) 1.010 - 1.025   Blood, UA negative    pH, UA 5.5 5.0 - 8.0   Protein, UA Negative Negative   Urobilinogen, UA 0.2 0.2 or 1.0 E.U./dL   Nitrite, UA negative    Leukocytes, UA Negative Negative    Assessment & Plan:   Problem List Items Addressed This Visit        Unprioritized   Encounter for general adult medical examination with abnormal findings - Primary (Chronic)    Preventative protocols reviewed and updated unless pt declined. Discussed healthy diet and lifestyle.       Hyperlipidemia associated with type 2 diabetes mellitus (HCC)    Chronic, on atorvastatin 32m daily - continue. The 10-year ASCVD risk score (Arnett DK, et al., 2019) is: 9.3%   Values used to calculate the score:     Age: 89years     Sex: Male     Is Non-Hispanic African American: Yes     Diabetic: Yes     Tobacco smoker: No     Systolic Blood Pressure: 1741mmHg     Is BP treated: No     HDL Cholesterol: 66.6 mg/dL     Total Cholesterol: 203 mg/dL       Relevant Medications   atorvastatin (LIPITOR) 40 MG tablet   dapagliflozin propanediol (FARXIGA) 5 MG TABS tablet   metFORMIN (GLUCOPHAGE) 500 MG tablet   Type 2 diabetes mellitus with other specified complication (HCC)    Chronic, overall stable period on metformin and farxiga - continue.      Relevant Medications   atorvastatin (LIPITOR) 40 MG tablet   dapagliflozin propanediol (FARXIGA) 5 MG TABS tablet   metFORMIN (GLUCOPHAGE) 500 MG tablet   GERD (gastroesophageal reflux disease)    Continues pantoprazole PRN.       Relevant Medications   pantoprazole (PROTONIX) 40 MG tablet   Travel advice encounter    [place]  UTD Tdap and other vaccines per pt.  Rx malaria prophylaxis with doxycycline 1034mdaily to start 1-2 d prior to trip and continue for 4 wks after trip.  Thrombocytopenia (HCC)    Chronic, stable.       Vitamin D deficiency    Continue vit D 2000 IU daily.       Right shoulder pain    Exam today most consistent with R shoulder bursitis with possible biceps tendonitis or RTC tendonitis. Supportive home measures reviewed. Provided with resistance band and stretching exercise from [facility] pt advisor on bursitis. Also discussed NSAID use. F/u with sports  medicine if not improving with treatment.       Recurrent infections    Wife has been experiencing recurrent infections including yeast infections and recent hospitalization for PID. Patient is asymptomatic. Will check UA and treat ppx with diflucan 127m x1 PO in case he's carrier of yeast infection. ?glucosuria contribution from SGLT2i.       Relevant Orders   POCT Urinalysis Dipstick (Automated) (Completed)   Other Visit Diagnoses     Need for influenza vaccination       Relevant Orders   Flu Vaccine QUAD 668moM (Fluarix, Fluzone & Alfiuria Quad PF) (Completed)   Need for shingles vaccine       Relevant Orders   Varicella-zoster vaccine IM (Completed)        Meds ordered this encounter  Medications   doxycycline (VIBRA-TABS) 100 MG tablet    Sig: Take 1 tablet (100 mg total) by mouth daily.    Dispense:  45 tablet    Refill:  0    For travel for malaria prevention   ibuprofen (ADVIL) 800 MG tablet    Sig: Take 1 tablet (800 mg total) by mouth 3 (three) times daily as needed for moderate pain.    Dispense:  30 tablet    Refill:  0   fluconazole (DIFLUCAN) 150 MG tablet    Sig: Take 1 tablet (150 mg total) by mouth once for 1 dose.    Dispense:  1 tablet    Refill:  0   atorvastatin (LIPITOR) 40 MG tablet    Sig: Take 1 tablet (40 mg total) by mouth daily.    Dispense:  90 tablet    Refill:  3   dapagliflozin propanediol (FARXIGA) 5 MG TABS tablet    Sig: Take 1 tablet (5 mg total) by mouth daily before breakfast.    Dispense:  30 tablet    Refill:  11   metFORMIN (GLUCOPHAGE) 500 MG tablet    Sig: Take 1 tablet (500 mg total) by mouth 2 (two) times daily with a meal.    Dispense:  180 tablet    Refill:  3   pantoprazole (PROTONIX) 40 MG tablet    Sig: Take 1 tablet (40 mg total) by mouth daily as needed (heartburn).    Dispense:  30 tablet    Refill:  3   Orders Placed This Encounter  Procedures   Flu Vaccine QUAD 61m68mo (Fluarix, Fluzone & Alfiuria Quad PF)    Varicella-zoster vaccine IM   POCT Urinalysis Dipstick (Automated)    Patient instructions: Flu shot today First Shingrix shot today. Schedule nurse visit in 2-6 months to complete the series.  I've refilled doxycycline to take preventatively for travel 01/2022. Start 1-2 days prior to trip, continue daily through 4 weeks after trip.  Schedule eye exam.  Urinalysis today.  For right shoulder - I think you have bursitis. Do exercises provided today, may take ibuprofen 800m20mice daily with meals for 5 days then as needed. Let us [place] if not better with this.  Return  as needed or in 6 months for diabetes follow up visit.  Doxycycline refilled for [place] trip 01/2022.   Follow up plan: Return in about 6 months (around 06/07/2022) for follow up visit.  Ria Bush, MD

## 2021-12-07 NOTE — Assessment & Plan Note (Signed)
Preventative protocols reviewed and updated unless pt declined. Discussed healthy diet and lifestyle.  

## 2021-12-07 NOTE — Patient Instructions (Addendum)
Flu shot today First Shingrix shot today. Schedule nurse visit in 2-6 months to complete the series.  I've refilled doxycycline to take preventatively for travel 01/2022. Start 1-2 days prior to trip, continue daily through 4 weeks after trip.  Schedule eye exam.  Urinalysis today.  For right shoulder - I think you have bursitis. Do exercises provided today, may take ibuprofen 800mg  twice daily with meals for 5 days then as needed. Let know if not better with this.  Return as needed or in 6 months for diabetes follow up visit.  Doxycycline refilled for [place] trip 01/2022.   Health Maintenance, Male Adopting a healthy lifestyle and getting preventive care are important in promoting health and wellness. Ask your health care provider about: The right schedule for you to have regular tests and exams. Things you can do on your own to prevent diseases and keep yourself healthy. What should I know about diet, weight, and exercise? Eat a healthy diet  Eat a diet that includes plenty of vegetables, fruits, low-fat dairy products, and lean protein. Do not eat a lot of foods that are high in solid fats, added sugars, or sodium. Maintain a healthy weight Body mass index (BMI) is a measurement that can be used to identify possible weight problems. It estimates body fat based on height and weight. Your health care provider can help determine your BMI and help you achieve or maintain a healthy weight. Get regular exercise Get regular exercise. This is one of the most important things you can do for your health. Most adults should: Exercise for at least 150 minutes each week. The exercise should increase your heart rate and make you sweat (moderate-intensity exercise). Do strengthening exercises at least twice a week. This is in addition to the moderate-intensity exercise. Spend less time sitting. Even light physical activity can be beneficial. Watch cholesterol and blood lipids Have your blood tested  for lipids and cholesterol at 51 years of age, then have this test every 5 years. You may need to have your cholesterol levels checked more often if: Your lipid or cholesterol levels are high. You are older than 51 years of age. You are at high risk for heart disease. What should I know about cancer screening? Many types of cancers can be detected early and may often be prevented. Depending on your health history and family history, you may need to have cancer screening at various ages. This may include screening for: Colorectal cancer. Prostate cancer. Skin cancer. Lung cancer. What should I know about heart disease, diabetes, and high blood pressure? Blood pressure and heart disease High blood pressure causes heart disease and increases the risk of stroke. This is more likely to develop in people who have high blood pressure readings or are overweight. Talk with your health care provider about your target blood pressure readings. Have your blood pressure checked: Every 3-5 years if you are 60-49 years of age. Every year if you are 28 years old or older. If you are between the ages of 20 and 4 and are a current or former smoker, ask your health care provider if you should have a one-time screening for abdominal aortic aneurysm (AAA). Diabetes Have regular diabetes screenings. This checks your fasting blood sugar level. Have the screening done: Once every three years after age 52 if you are at a normal weight and have a low risk for diabetes. More often and at a younger age if you are overweight or have a high risk  for diabetes. What should I know about preventing infection? Hepatitis B If you have a higher risk for hepatitis B, you should be screened for this virus. Talk with your health care provider to find out if you are at risk for hepatitis B infection. Hepatitis C Blood testing is recommended for: Everyone born from [birth year] through 1965. Anyone with known risk factors for hepatitis  C. Sexually transmitted infections (STIs) You should be screened each year for STIs, including gonorrhea and chlamydia, if: You are sexually active and are younger than 51 years of age. You are older than 51 years of age and your health care provider tells you that you are at risk for this type of infection. Your sexual activity has changed since you were last screened, and you are at increased risk for chlamydia or gonorrhea. Ask your health care provider if you are at risk. Ask your health care provider about whether you are at high risk for HIV. Your health care provider may recommend a prescription medicine to help prevent HIV infection. If you choose to take medicine to prevent HIV, you should first get tested for HIV. You should then be tested every 3 months for as long as you are taking the medicine. Follow these instructions at home: Alcohol use Do not drink alcohol if your health care provider tells you not to drink. If you drink alcohol: Limit how much you have to 0-2 drinks a day. Know how much alcohol is in your drink. In the [place] one drink equals one 12 oz bottle of beer (355 mL), one 5 oz glass of wine (148 mL), or one 1 oz glass of hard liquor (44 mL). Lifestyle Do not use any products that contain nicotine or tobacco. These products include cigarettes, chewing tobacco, and vaping devices, such as e-cigarettes. If you need help quitting, ask your health care provider. Do not use street drugs. Do not share needles. Ask your health care provider for help if you need support or information about quitting drugs. General instructions Schedule regular health, dental, and eye exams. Stay current with your vaccines. Tell your health care provider if: You often feel depressed. You have ever been abused or do not feel safe at home. Summary Adopting a healthy lifestyle and getting preventive care are important in promoting health and wellness. Follow your health care provider's  instructions about healthy diet, exercising, and getting tested or screened for diseases. Follow your health care provider's instructions on monitoring your cholesterol and blood pressure. This information is not intended to replace advice given to you by your health care provider. Make sure you discuss any questions you have with your health care provider. Document Revised: 05/17/2020 Document Reviewed: 05/17/2020 Elsevier Patient Education  2023 [employer].

## 2021-12-09 DIAGNOSIS — M25511 Pain in right shoulder: Secondary | ICD-10-CM | POA: Insufficient documentation

## 2021-12-09 DIAGNOSIS — B999 Unspecified infectious disease: Secondary | ICD-10-CM | POA: Insufficient documentation

## 2021-12-09 DIAGNOSIS — G8929 Other chronic pain: Secondary | ICD-10-CM | POA: Insufficient documentation

## 2021-12-09 MED ORDER — METFORMIN HCL 500 MG PO TABS: 500.0000 mg | ORAL_TABLET | Freq: Two times a day (BID) | ORAL | 3 refills | Status: DC

## 2021-12-09 MED ORDER — DAPAGLIFLOZIN PROPANEDIOL 5 MG PO TABS: 5.0000 mg | ORAL_TABLET | Freq: Every day | ORAL | 11 refills | Status: DC

## 2021-12-09 MED ORDER — ATORVASTATIN CALCIUM 40 MG PO TABS: 40.0000 mg | ORAL_TABLET | Freq: Every day | ORAL | 3 refills | Status: DC

## 2021-12-09 MED ORDER — PANTOPRAZOLE SODIUM 40 MG PO TBEC: 40.0000 mg | DELAYED_RELEASE_TABLET | Freq: Every day | ORAL | 3 refills | Status: DC | PRN

## 2021-12-09 NOTE — Assessment & Plan Note (Signed)
Chronic, stable 

## 2021-12-09 NOTE — Assessment & Plan Note (Signed)
Chronic, overall stable period on metformin and farxiga - continue.

## 2021-12-09 NOTE — Assessment & Plan Note (Signed)
Upcoming 2 wk trip to [place] 01/2022.  UTD Tdap and other vaccines per pt.  Rx malaria prophylaxis with doxycycline 100mg  daily to start 1-2 d prior to trip and continue for 4 wks after trip.

## 2021-12-09 NOTE — Assessment & Plan Note (Signed)
Chronic, on atorvastatin 40mg  daily - continue. The 10-year ASCVD risk score (Arnett DK, et al., 2019) is: 9.3%   Values used to calculate the score:     Age: 51 years     Sex: Male     Is Non-Hispanic African American: Yes     Diabetic: Yes     Tobacco smoker: No     Systolic Blood Pressure: 126 mmHg     Is BP treated: No     HDL Cholesterol: 66.6 mg/dL     Total Cholesterol: 203 mg/dL

## 2021-12-09 NOTE — Assessment & Plan Note (Signed)
Continue vit D 2000 IU daily.  

## 2021-12-09 NOTE — Assessment & Plan Note (Signed)
Exam today most consistent with R shoulder bursitis with possible biceps tendonitis or RTC tendonitis. Supportive home measures reviewed. Provided with resistance band and stretching exercise from [facility] pt advisor on bursitis. Also discussed NSAID use. F/u with sports medicine if not improving with treatment.

## 2021-12-09 NOTE — Assessment & Plan Note (Signed)
Continues pantoprazole PRN.

## 2021-12-09 NOTE — Assessment & Plan Note (Addendum)
Wife has been experiencing recurrent infections including yeast infections and recent hospitalization for PID. Patient is asymptomatic. Will check UA and treat ppx with diflucan 150mg  x1 PO in case he's carrier of yeast infection. ?glucosuria contribution from SGLT2i.

## 2021-12-19 ENCOUNTER — Ambulatory Visit (INDEPENDENT_AMBULATORY_CARE_PROVIDER_SITE_OTHER): Payer: 59 | Admitting: Family Medicine

## 2021-12-19 ENCOUNTER — Encounter: Payer: Self-pay | Admitting: Family Medicine

## 2021-12-19 VITALS — BP 110/76 | HR 90 | Temp 97.9°F | Ht 69.0 in | Wt 181.1 lb

## 2021-12-19 DIAGNOSIS — M7541 Impingement syndrome of right shoulder: Secondary | ICD-10-CM

## 2021-12-19 MED ORDER — IBUPROFEN 800 MG PO TABS: 800.0000 mg | ORAL_TABLET | Freq: Three times a day (TID) | ORAL | 3 refills | Status: DC | PRN

## 2021-12-19 MED ORDER — TRIAMCINOLONE ACETONIDE 40 MG/ML IJ SUSP
40.0000 mg | Freq: Once | INTRAMUSCULAR | Status: AC
  Administered 2021-12-19: 40 mg via INTRA_ARTICULAR

## 2021-12-19 NOTE — Progress Notes (Signed)
Gary Long T. Gary Weins, MD, [place] at [facility] [place], [address]  Phone: [phone]  FAX: [phone]  Gary Long - 51 y.o. male  MRN [id number]  Date of Birth: 06-Apr-1970  Date: 12/19/2021  PCP: Ria Bush, MD  Referral: Ria Bush, MD  Chief Complaint  Patient presents with   Shoulder Pain    Right   Subjective:   This 51 y.o. male patient noted above presents with R shoulder pain that has been ongoing for 3 weeks. there is no history of trauma or accident recently The patient denies neck pain or radicular symptoms. Denies dislocation, subluxation, separation of the shoulder. The patient does complain of pain in the overhead plane with significant painful arc of motion.  Has lost motion of his shoulder, pain with terminal motion.  Now taking some ibuprofen.   No injury at all.  3 weeks ago.   Medications Tried: Tylenol, NSAIDS Ice or Heat: minimally helpful Tried PT: No  Prior shoulder Injury: No Prior surgery: No Prior fracture: No   Review of Systems is noted in the HPI, as appropriate  Objective:   Blood pressure 110/76, pulse 90, temperature 97.9 F (36.6 C), temperature source Oral, height _0  (1.753 m), weight 181 lb 2 oz (82.2 kg), SpO2 96 %.  Shoulder: R Inspection: No muscle wasting or winging Ecchymosis/edema: neg  AC joint, scapula, clavicle: NT Cervical spine: NT, full ROM Spurling's: neg Abduction: full, 5/5 Flexion: full, 5/5 IR, full, lift-off: 5/5 ER at neutral: full, 5/5 AC crossover: neg Neer: pos Hawkins: pos Drop Test: neg Empty Can: pos Supraspinatus insertion: mild-mod T Bicipital groove: NT Speed's: neg Yergason's: neg Sulcus sign: neg Scapular dyskinesis: none C5-T1 intact  Neuro: Sensation intact Grip 5/5   Radiology: No results found.  Assessment and Plan:      ICD-10-CM   1. Impingement syndrome of right shoulder   M75.41 triamcinolone acetonide (KENALOG-40) injection 40 mg     Rotator cuff strengthening and scapular stabilization emphasized.     Cont with stabile TID ibuprofen.   The patient could benefit ideally from formal PT to assist with scapular stabilization and RTC strengthening, and certainly if symptoms persist.  Aspiration/Injection Procedure Note Gary Long 11-18-1970 Date of procedure: 12/19/2021  Procedure: Large Joint Aspiration / Injection of Shoulder, Subacromial, R Indications: Pain  Procedure Details Verbal consent was obtained from the patient. Risks, benefits, and alternatives were explained. Patient prepped with Chloraprep and Ethyl Chloride used for anesthesia. The subacromial space was injected using the posterior approach. The patient tolerated the procedure well and had decreased pain post injection. No complications. Injection: 9 cc of Lidocaine 1% and 1 mL of Kenalog 40 mg. Needle: 22 gauge Medication: 1 mL of Kenalog 40 mg    Follow-up: No follow-ups on file.  Meds ordered this encounter  Medications   ibuprofen (ADVIL) 800 MG tablet    Sig: Take 1 tablet (800 mg total) by mouth 3 (three) times daily as needed for moderate pain.    Dispense:  90 tablet    Refill:  3   triamcinolone acetonide (KENALOG-40) injection 40 mg   No orders of the defined types were placed in this encounter.   Signed,  Maud Deed. Gary Vines, MD   Patient's Medications  New Prescriptions   No medications on file  Previous Medications   ACCU-CHEK FASTCLIX LANCETS MISC    USE AS DIRECTED TO CHECK BLOOD SUGAR ONCE DAILY  ATORVASTATIN (LIPITOR) 40 MG TABLET    Take 1 tablet (40 mg total) by mouth daily.   BLOOD GLUCOSE MONITORING SUPPL (ACCU-CHEK GUIDE) W/DEVICE KIT    1 each by Does not apply route as needed. Use as directed to check blood sugar once daily   CHOLECALCIFEROL (VITAMIN D3) 50 MCG (2000 UT) CAPSULE    Take 1 capsule (2,000 Units total) by mouth daily.    DAPAGLIFLOZIN PROPANEDIOL (FARXIGA) 5 MG TABS TABLET    Take 1 tablet (5 mg total) by mouth daily before breakfast.   DOXYCYCLINE (VIBRA-TABS) 100 MG TABLET    Take 1 tablet (100 mg total) by mouth daily.   GLUCOSE BLOOD (ACCU-CHEK GUIDE) TEST STRIP    USE AS DIRECTED TO CHECK BLOOD SUGAR ONCE DAILY   HYDROXYZINE (ATARAX/VISTARIL) 25 MG TABLET    TAKE 1/2 TO 1 TABLET BY MOUTH 3 TIMES A DAY AS NEEDED FOR ITCHING (SEDATION PRECAUTIONS)   METFORMIN (GLUCOPHAGE) 500 MG TABLET    Take 1 tablet (500 mg total) by mouth 2 (two) times daily with a meal.   METHOCARBAMOL (ROBAXIN) 500 MG TABLET    TAKE 1 TABLET BY MOUTH THREE TIMES A DAY AS NEEDED FOR MUSCLE SPASMS   PANTOPRAZOLE (PROTONIX) 40 MG TABLET    Take 1 tablet (40 mg total) by mouth daily as needed (heartburn).  Modified Medications   Modified Medication Previous Medication   IBUPROFEN (ADVIL) 800 MG TABLET ibuprofen (ADVIL) 800 MG tablet      Take 1 tablet (800 mg total) by mouth 3 (three) times daily as needed for moderate pain.    Take 1 tablet (800 mg total) by mouth 3 (three) times daily as needed for moderate pain.  Discontinued Medications   TRIAMCINOLONE CREAM (KENALOG) 0.1 %    APPLY TOPICALLY 2 (TWO) TIMES DAILY AS NEEDED. MAX 10 DAYS AT A TIME

## 2022-01-17 ENCOUNTER — Other Ambulatory Visit: Payer: Self-pay | Admitting: Family Medicine

## 2022-01-17 NOTE — Telephone Encounter (Signed)
Lvm asking pt to call back. Need reason pt is requesting rx.  

## 2022-01-18 NOTE — Telephone Encounter (Signed)
Lvm asking pt to call back. Need reason pt is requesting rx.  

## 2022-01-19 NOTE — Telephone Encounter (Signed)
Lvm asking pt to call back. Need reason pt is requesting rx.

## 2022-01-20 NOTE — Telephone Encounter (Signed)
Lvm asking pt to call back. Need reason pt is requesting rx.  

## 2022-02-22 ENCOUNTER — Telehealth: Payer: Self-pay

## 2022-02-22 MED ORDER — EMPAGLIFLOZIN 10 MG PO TABS: 10.0000 mg | ORAL_TABLET | Freq: Every day | ORAL | 11 refills | Status: DC

## 2022-02-22 NOTE — Telephone Encounter (Signed)
Received faxed message from [facility] stating pt's ins co covers Gary Long, Gary Long is non formulary.

## 2022-02-22 NOTE — Telephone Encounter (Signed)
Plz notify pt of this, I have sent jardiance 62m in place of farxiga 543m

## 2022-02-22 NOTE — Addendum Note (Signed)
Addended by: Ria Bush on: 02/22/2022 04:31 PM   Modules accepted: Orders

## 2022-02-22 NOTE — Telephone Encounter (Signed)
Spoke with pt relaying message from [facility] and Dr. Darnell Level. Pt verbalizes understanding and expresses his thanks.

## 2022-03-12 ENCOUNTER — Emergency Department (HOSPITAL_COMMUNITY)
Admission: EM | Admit: 2022-03-12 | Discharge: 2022-03-12 | Disposition: A | Discharge status: Home / Self Care | Payer: 59 | Attending: Emergency Medicine | Admitting: Emergency Medicine

## 2022-03-12 ENCOUNTER — Encounter (HOSPITAL_COMMUNITY): Payer: Self-pay

## 2022-03-12 ENCOUNTER — Other Ambulatory Visit: Payer: Self-pay

## 2022-03-12 DIAGNOSIS — E119 Type 2 diabetes mellitus without complications: Secondary | ICD-10-CM | POA: Insufficient documentation

## 2022-03-12 DIAGNOSIS — Z7984 Long term (current) use of oral hypoglycemic drugs: Secondary | ICD-10-CM | POA: Diagnosis not present

## 2022-03-12 DIAGNOSIS — R1084 Generalized abdominal pain: Secondary | ICD-10-CM | POA: Diagnosis not present

## 2022-03-12 DIAGNOSIS — R109 Unspecified abdominal pain: Secondary | ICD-10-CM | POA: Diagnosis not present

## 2022-03-12 DIAGNOSIS — R112 Nausea with vomiting, unspecified: Secondary | ICD-10-CM

## 2022-03-12 DIAGNOSIS — I10 Essential (primary) hypertension: Secondary | ICD-10-CM | POA: Diagnosis not present

## 2022-03-12 DIAGNOSIS — R531 Weakness: Secondary | ICD-10-CM | POA: Diagnosis not present

## 2022-03-12 DIAGNOSIS — R5383 Other fatigue: Secondary | ICD-10-CM | POA: Diagnosis not present

## 2022-03-12 DIAGNOSIS — K5900 Constipation, unspecified: Secondary | ICD-10-CM | POA: Diagnosis not present

## 2022-03-12 LAB — CBC WITH DIFFERENTIAL/PLATELET
Abs Immature Granulocytes: 0.01 10*3/uL (ref 0.00–0.07)
Basophils Absolute: 0 10*3/uL (ref 0.0–0.1)
Basophils Relative: 0 %
Eosinophils Absolute: 0 10*3/uL (ref 0.0–0.5)
Eosinophils Relative: 0 %
HCT: 45.9 % (ref 39.0–52.0)
Hemoglobin: 15.1 g/dL (ref 13.0–17.0)
Immature Granulocytes: 0 %
Lymphocytes Relative: 6 %
Lymphs Abs: 0.4 10*3/uL — ABNORMAL LOW (ref 0.7–4.0)
MCH: 28.8 pg (ref 26.0–34.0)
MCHC: 32.9 g/dL (ref 30.0–36.0)
MCV: 87.4 fL (ref 80.0–100.0)
Monocytes Absolute: 0.4 10*3/uL (ref 0.1–1.0)
Monocytes Relative: 6 %
Neutro Abs: 5.7 10*3/uL (ref 1.7–7.7)
Neutrophils Relative %: 88 %
Platelets: 125 10*3/uL — ABNORMAL LOW (ref 150–400)
RBC: 5.25 MIL/uL (ref 4.22–5.81)
RDW: 13.1 % (ref 11.5–15.5)
WBC: 6.5 10*3/uL (ref 4.0–10.5)
nRBC: 0 % (ref 0.0–0.2)

## 2022-03-12 LAB — COMPREHENSIVE METABOLIC PANEL
ALT: 30 U/L (ref 0–44)
AST: 25 U/L (ref 15–41)
Albumin: 3.9 g/dL (ref 3.5–5.0)
Alkaline Phosphatase: 74 U/L (ref 38–126)
Anion gap: 12 (ref 5–15)
BUN: 19 mg/dL (ref 6–20)
CO2: 22 mmol/L (ref 22–32)
Calcium: 8.9 mg/dL (ref 8.9–10.3)
Chloride: 105 mmol/L (ref 98–111)
Creatinine, Ser: 1.08 mg/dL (ref 0.61–1.24)
GFR, Estimated: >60 mL/min (ref >60)
Glucose, Bld: 141 mg/dL — ABNORMAL HIGH (ref 70–99)
Potassium: 3.9 mmol/L (ref 3.5–5.1)
Sodium: 139 mmol/L (ref 135–145)
Total Bilirubin: 0.7 mg/dL (ref 0.3–1.2)
Total Protein: 7.5 g/dL (ref 6.5–8.1)

## 2022-03-12 LAB — LIPASE, BLOOD: Lipase: 41 U/L (ref 11–51)

## 2022-03-12 MED ORDER — SODIUM CHLORIDE 0.9 % IV BOLUS
1000.0000 mL | Freq: Once | INTRAVENOUS | Status: AC
  Administered 2022-03-12: 1000 mL via INTRAVENOUS

## 2022-03-12 MED ORDER — DICYCLOMINE HCL 10 MG/ML IM SOLN
20.0000 mg | Freq: Once | INTRAMUSCULAR | Status: AC
  Administered 2022-03-12: 20 mg via INTRAMUSCULAR
  Filled 2022-03-12: qty 2

## 2022-03-12 MED ORDER — ONDANSETRON 4 MG PO TBDP: 4.0000 mg | ORAL_TABLET | Freq: Four times a day (QID) | ORAL | 0 refills | Status: DC | PRN

## 2022-03-12 MED ORDER — ACETAMINOPHEN 325 MG PO TABS
650.0000 mg | ORAL_TABLET | Freq: Once | ORAL | Status: AC
  Administered 2022-03-12: 650 mg via ORAL
  Filled 2022-03-12: qty 2

## 2022-03-12 MED ORDER — ONDANSETRON HCL 4 MG/2ML IJ SOLN
4.0000 mg | Freq: Once | INTRAMUSCULAR | Status: AC
  Administered 2022-03-12: 4 mg via INTRAVENOUS
  Filled 2022-03-12: qty 2

## 2022-03-12 MED ORDER — FAMOTIDINE IN NACL 20-0.9 MG/50ML-% IV SOLN
20.0000 mg | Freq: Once | INTRAVENOUS | Status: AC
  Administered 2022-03-12: 20 mg via INTRAVENOUS
  Filled 2022-03-12: qty 50

## 2022-03-12 MED ORDER — ALUM & MAG HYDROXIDE-SIMETH 200-200-20 MG/5ML PO SUSP: 30.0000 mL | Freq: Once | ORAL | Status: DC

## 2022-03-12 NOTE — Discharge Instructions (Addendum)
It was a pleasure taking care of you today. You were seen today for nausea and vomiting.  Given your recent illness of your family members it is likely a viral illness.  If you drink plenty of fluids.  You are given Zofran to help with the nausea.  Come back to the ER if you have pain, persistent vomiting, fevers or any other worsening symptoms.

## 2022-03-12 NOTE — ED Provider Notes (Signed)
[facility] Provider Note   CSN: [id number] Arrival date & time: 03/12/22  1447     History  Chief Complaint  Patient presents with   Abdominal Pain    Gary Long is a 52 y.o. male.  He has past medical history of diabetes.  Presents emerged department today complaining o nausea vomiting with abdominal discomfort that started last night.  He reports vomiting 6 times.  He reports that his wife and daughter had a similar symptoms over the past week but are now improved.  Denies fevers or chills.  No chest pain or shortness of breath but states he feels very fatigued and has generalized weakness.   Abdominal Pain      Home Medications Prior to Admission medications   Medication Sig Start Date End Date Taking? Authorizing Provider  ondansetron (ZOFRAN-ODT) 4 MG disintegrating tablet Take 1 tablet (4 mg total) by mouth every 6 (six) hours as needed for nausea or vomiting. 03/12/22  Yes Ohn Bostic A, PA-C  Accu-Chek FastClix Lancets MISC USE AS DIRECTED TO CHECK BLOOD SUGAR ONCE DAILY 01/20/19   Ria Bush, MD  atorvastatin (LIPITOR) 40 MG tablet Take 1 tablet (40 mg total) by mouth daily. 12/09/21   Ria Bush, MD  Blood Glucose Monitoring Suppl (ACCU-CHEK GUIDE) w/Device KIT 1 each by Does not apply route as needed. Use as directed to check blood sugar once daily 06/29/21   Ria Bush, MD  Cholecalciferol (VITAMIN D3) 50 MCG (2000 UT) capsule Take 1 capsule (2,000 Units total) by mouth daily. 11/21/20   Ria Bush, MD  doxycycline (VIBRA-TABS) 100 MG tablet Take 1 tablet (100 mg total) by mouth daily. Patient not taking: Reported on 12/19/2021 12/07/21   Ria Bush, MD  empagliflozin (JARDIANCE) 10 MG TABS tablet Take 1 tablet (10 mg total) by mouth daily before breakfast. 02/22/22   Ria Bush, MD  glucose blood (ACCU-CHEK GUIDE) test strip USE AS DIRECTED TO CHECK BLOOD SUGAR ONCE DAILY  11/22/21   Ria Bush, MD  hydrOXYzine (ATARAX/VISTARIL) 25 MG tablet TAKE 1/2 TO 1 TABLET BY MOUTH 3 TIMES A DAY AS NEEDED FOR ITCHING (SEDATION PRECAUTIONS) 04/16/20   Ria Bush, MD  ibuprofen (ADVIL) 800 MG tablet Take 1 tablet (800 mg total) by mouth 3 (three) times daily as needed for moderate pain. 12/19/21   Copland, Frederico Hamman, MD  metFORMIN (GLUCOPHAGE) 500 MG tablet Take 1 tablet (500 mg total) by mouth 2 (two) times daily with a meal. 12/09/21   Ria Bush, MD  methocarbamol (ROBAXIN) 500 MG tablet TAKE 1 TABLET BY MOUTH THREE TIMES A DAY AS NEEDED FOR MUSCLE SPASMS 07/20/21   Ria Bush, MD  pantoprazole (PROTONIX) 40 MG tablet Take 1 tablet (40 mg total) by mouth daily as needed (heartburn). 12/09/21   Ria Bush, MD      Allergies    Chloroquine, Effexor [venlafaxine], Hydrocodone, and Percocet [oxycodone-acetaminophen]    Review of Systems   Review of Systems  Gastrointestinal:  Positive for abdominal pain.    Physical Exam Updated Vital Signs BP 117/84   Pulse 95   Temp 98.3 F (36.8 C) (Oral)   Resp 15   SpO2 97%  Physical Exam Vitals and nursing note reviewed.  Constitutional:      General: He is not in acute distress.    Appearance: He is well-developed.  HENT:     Head: Normocephalic and atraumatic.  Eyes:     Conjunctiva/sclera: Conjunctivae normal.  Cardiovascular:  Rate and Rhythm: Normal rate and regular rhythm.     Heart sounds: No murmur heard. Pulmonary:     Effort: Pulmonary effort is normal. No respiratory distress.     Breath sounds: Normal breath sounds.  Abdominal:     Palpations: Abdomen is soft.     Tenderness: There is no abdominal tenderness.  Musculoskeletal:        General: No swelling.     Cervical back: Neck supple.  Skin:    General: Skin is warm and dry.     Capillary Refill: Capillary refill takes less than 2 seconds.  Neurological:     Mental Status: He is alert.  Psychiatric:        Mood and  Affect: Mood normal.     ED Results / Procedures / Treatments   Labs (all labs ordered are listed, but only abnormal results are displayed) Labs Reviewed  CBC WITH DIFFERENTIAL/PLATELET - Abnormal; Notable for the following components:      Result Value   Platelets 125 (*)    Lymphs Abs 0.4 (*)    All other components within normal limits  COMPREHENSIVE METABOLIC PANEL - Abnormal; Notable for the following components:   Glucose, Bld 141 (*)    All other components within normal limits  LIPASE, BLOOD    EKG None  Radiology No results found.  Procedures Procedures    Medications Ordered in ED Medications  sodium chloride 0.9 % bolus 1,000 mL (0 mLs Intravenous Stopped 03/12/22 1803)  ondansetron (ZOFRAN) injection 4 mg (4 mg Intravenous Given 03/12/22 1518)  famotidine (PEPCID) IVPB 20 mg premix (0 mg Intravenous Stopped 03/12/22 1611)  dicyclomine (BENTYL) injection 20 mg (20 mg Intramuscular Given 03/12/22 1518)  acetaminophen (TYLENOL) tablet 650 mg (650 mg Oral Given 03/12/22 1802)    ED Course/ Medical Decision Making/ A&P                             Medical Decision Making This patient presents to the ED for concern of nausea vomiting abdominal pain, this involves an extensive number of treatment options, and is a complaint that carries with it a high risk of complications and morbidity.  The differential diagnosis includes this, peptic ulcer disease, gastroenteritis, appendicitis, cholecystitis, other   Co morbidities that complicate the patient evaluation  Type 2 diabetes   Additional history obtained:  Additional history obtained from EMR External records from outside source obtained and reviewed including prior outpatient visits   Lab Tests:  I Ordered, and personally interpreted labs.  The pertinent results include: BC CMP and lipase are all reassuring, no significant leukocytosis, no anemia, normal lipase.  Platelets slightly low but at patient's  baseline    Problem List / ED Course / Critical interventions / Medication management  He comes in with nausea vomiting the setting of his wife and daughter recently having similar symptoms.  His symptoms started last night.  He was given Zofran and fluids and is feeling much better, he complained of some generalized malaise and mild headache improved with Tylenol and fluids.  He does have some mild epigastric tenderness.  Likely from persistent vomiting.  He does not have pancreatitis.  I discussed with patient risk and benefits of imaging, he does not want imaging at this time and feels better.  Discussed strict return precautions.  Will give him Zofran for home I ordered medication including Zofran for nausea Reevaluation of the patient after these  medicines showed that the patient improved I have reviewed the patients home medicines and have made adjustments as needed    Test / Admission - Considered:  Sitter CT as above but given history of lab and exam findings is not necessary, discussed with patient who is agreeable with this as well.   Amount and/or Complexity of Data Reviewed Labs: ordered.  Risk Prescription drug management.           Final Clinical Impression(s) / ED Diagnoses Final diagnoses:  Nausea and vomiting, unspecified vomiting type    Rx / DC Orders ED Discharge Orders          Ordered    ondansetron (ZOFRAN-ODT) 4 MG disintegrating tablet  Every 6 hours PRN        03/12/22 1901              Darci Current 03/12/22 Nemiah Commander, MD 03/12/22 [phone]

## 2022-03-12 NOTE — ED Triage Notes (Signed)
Pt BIB [facility] from home d/t abd pain that started yesterday along w some nausea and vomiting. Pt stated he vomited abt 6 times at home. Pt stated It hurts all over . A&O X4.

## 2022-03-13 ENCOUNTER — Telehealth: Payer: Self-pay

## 2022-03-13 NOTE — Transitions of Care (Post Inpatient/ED Visit) (Signed)
   03/13/2022  Name: Gary Long MRN: [id number] DOB: Sep 11, 1970  Today's TOC FU Call Status: Today's TOC FU Call Status:: Successful TOC FU Call Competed TOC FU Call Complete Date: 03/13/22  Transition Care Management Follow-up Telephone Call Date of Discharge: 03/12/22 Discharge Facility[facility]) Type of Discharge: Emergency Department Reason for ED Visit: Other: (Nausea and vomiting, unspecified vomiting type) How have you been since you were released from the hospital?: Better Any questions or concerns?: No  Items Reviewed: Did you receive and understand the discharge instructions provided?: Yes Medications obtained and verified?: Yes (Medications Reviewed) Any new allergies since your discharge?: No Dietary orders reviewed?: NA Do you have support at home?: Yes  Home Care and Equipment/Supplies: [facility] Ordered?: NA Any new equipment or medical supplies ordered?: NA  Functional Questionnaire: Do you need assistance with bathing/showering or dressing?: No Do you need assistance with meal preparation?: No Do you need assistance with eating?: No Do you have difficulty maintaining continence: No Do you need assistance with getting out of bed/getting out of a chair/moving?: No Do you have difficulty managing or taking your medications?: No  Folllow up appointments reviewed: PCP Follow-up appointment confirmed?: Yes Date of PCP follow-up appointment?: 03/16/22 Follow-up Provider: [facility] Follow-up appointment confirmed?: NA Do you need transportation to your follow-up appointment?: No Do you understand care options if your condition(s) worsen?: Yes-patient verbalized understanding    [place] LPN [place] Direct Dial [phone]

## 2022-03-13 NOTE — Telephone Encounter (Signed)
Spoke with pt to schedule ER f/u with Dr. Darnell Level. Pt states he was only available Thurs this week. However, he will try to change work schedule and will call back to schedule ER with his PCP, Dr. Darnell Level.

## 2022-03-13 NOTE — Telephone Encounter (Addendum)
Sounds like he may have had viral gastritis stomach bug or possible food poisoning.  Would recommend OV if ongoing symptoms. If he's doing fully better, likely doesn't need ER f/u visit.  Would avoid ibuprofen at this time.

## 2022-03-13 NOTE — Telephone Encounter (Signed)
He needs to follow-up with his primary care provider.   Lattie Haw, can you help Dr. Synthia Innocent patient follow-up with him for ER follow-up.

## 2022-03-14 NOTE — Telephone Encounter (Signed)
Spoke with pt relaying Dr. Synthia Innocent message. Pt verbalizes understanding. Says he's still have minor symptoms but he'll have to call back Fri to schedule OV next week after checking work schedule. Pt will keep 03/16/22 OV with Dr. Lorelei Pont about R shoulder.

## 2022-03-16 ENCOUNTER — Ambulatory Visit (INDEPENDENT_AMBULATORY_CARE_PROVIDER_SITE_OTHER): Payer: 59 | Admitting: Family Medicine

## 2022-03-16 ENCOUNTER — Encounter: Payer: Self-pay | Admitting: Family Medicine

## 2022-03-16 VITALS — BP 100/60 | HR 83 | Temp 97.5°F | Ht 69.0 in | Wt 186.5 lb

## 2022-03-16 DIAGNOSIS — M7541 Impingement syndrome of right shoulder: Secondary | ICD-10-CM

## 2022-03-16 DIAGNOSIS — M7581 Other shoulder lesions, right shoulder: Secondary | ICD-10-CM | POA: Diagnosis not present

## 2022-03-16 NOTE — Progress Notes (Signed)
Gary Pesce T. Katira Dumais, MD, [place] at [facility] [place], [address]  Phone: [phone]  FAX: [phone]  Gary Long - 52 y.o. male  MRN [id number]  Date of Birth: 12/16/1970  Date: 03/16/2022  PCP: Ria Bush, MD  Referral: Ria Bush, MD  Chief Complaint  Patient presents with   Shoulder Pain    Right   Subjective:   Gary Long is a 52 y.o. very pleasant male patient with Body mass index is 27.54 kg/m. who presents with the following:  The patient here is in follow-up for some ongoing right-sided shoulder pain.  I saw him last on December 19, 2021, at that point I did do a subacromial injection.  Also had him take some scheduled ibuprofen, and work on a home rehab program.  Injection helped a little bit but pain with lifting it and all over the place.   Stretching it some.  Taking some motrin BID.  No home rehab.  He has pain in the plane of abduction and internal range of motion.  He has pain with reaching across the body and rotational movements of the shoulder.  His shoulder is limiting his day-to-day life right now, and he is having pain and a deep dull ache in the shoulder.  T-shirt distribution of pain.  R RTC tendonitis  Review of Systems is noted in the HPI, as appropriate  Objective:   BP 100/60   Pulse 83   Temp (!) 97.5 F (36.4 C) (Temporal)   Ht 5\' 9"  (1.753 m)   Wt 186 lb 8 oz (84.6 kg)   SpO2 96%   BMI 27.54 kg/m    Shoulder: R Inspection: No muscle wasting or winging Ecchymosis/edema: neg  AC joint, scapula, clavicle: NT Cervical spine: NT, full ROM Spurling's: neg Abduction: full, 5/5 Flexion: full, 5/5 IR, full, lift-off: 5/5 ER at neutral: full, 5/5 AC crossover: neg Neer: pos Hawkins: pos Drop Test: neg Empty Can: pos Supraspinatus insertion: mild-mod T Bicipital groove: NT Speed's: neg Yergason's: neg Sulcus sign: neg Scapular  dyskinesis: none C5-T1 intact  Neuro: Sensation intact Grip 5/5   Laboratory and Imaging Data:  Assessment and Plan:     ICD-10-CM   1. Impingement syndrome of right shoulder  M75.41 Ambulatory referral to Physical Therapy    2. Rotator cuff tendonitis, right  M75.81 Ambulatory referral to Physical Therapy     Classic rotator cuff tendinopathy.  I gave him a home rehab program and encouraged him to continue to move a throwers 12 type program.  I am going to send him for formal physical therapy and follow-up in 2 months.  Orders placed today for conditions managed today: Orders Placed This Encounter  Procedures   Ambulatory referral to Physical Therapy    Disposition: 2 mo  [employer] One speech-to-text software was used for transcription in this dictation.  Possible transcriptional errors can occur using Editor, commissioning.   Signed,  Maud Deed. Jaila Schellhorn, MD   Outpatient Encounter Medications as of 03/16/2022  Medication Sig   Accu-Chek FastClix Lancets MISC USE AS DIRECTED TO CHECK BLOOD SUGAR ONCE DAILY   atorvastatin (LIPITOR) 40 MG tablet Take 1 tablet (40 mg total) by mouth daily.   Blood Glucose Monitoring Suppl (ACCU-CHEK GUIDE) w/Device KIT 1 each by Does not apply route as needed. Use as directed to check blood sugar once daily   Cholecalciferol (VITAMIN D3) 50 MCG (2000 UT) capsule Take 1  capsule (2,000 Units total) by mouth daily.   doxycycline (VIBRA-TABS) 100 MG tablet Take 1 tablet (100 mg total) by mouth daily.   empagliflozin (JARDIANCE) 10 MG TABS tablet Take 1 tablet (10 mg total) by mouth daily before breakfast.   glucose blood (ACCU-CHEK GUIDE) test strip USE AS DIRECTED TO CHECK BLOOD SUGAR ONCE DAILY   hydrOXYzine (ATARAX/VISTARIL) 25 MG tablet TAKE 1/2 TO 1 TABLET BY MOUTH 3 TIMES A DAY AS NEEDED FOR ITCHING (SEDATION PRECAUTIONS)   ibuprofen (ADVIL) 800 MG tablet Take 1 tablet (800 mg total) by mouth 3 (three) times daily as needed for moderate pain.    metFORMIN (GLUCOPHAGE) 500 MG tablet Take 1 tablet (500 mg total) by mouth 2 (two) times daily with a meal.   methocarbamol (ROBAXIN) 500 MG tablet TAKE 1 TABLET BY MOUTH THREE TIMES A DAY AS NEEDED FOR MUSCLE SPASMS   ondansetron (ZOFRAN-ODT) 4 MG disintegrating tablet Take 1 tablet (4 mg total) by mouth every 6 (six) hours as needed for nausea or vomiting.   pantoprazole (PROTONIX) 40 MG tablet Take 1 tablet (40 mg total) by mouth daily as needed (heartburn).   No facility-administered encounter medications on file as of 03/16/2022.

## 2022-03-24 ENCOUNTER — Ambulatory Visit (INDEPENDENT_AMBULATORY_CARE_PROVIDER_SITE_OTHER): Payer: 59 | Admitting: Family Medicine

## 2022-03-24 ENCOUNTER — Encounter: Payer: Self-pay | Admitting: Family Medicine

## 2022-03-24 VITALS — BP 128/84 | HR 81 | Temp 97.3°F | Ht 69.0 in | Wt 182.0 lb

## 2022-03-24 DIAGNOSIS — K2970 Gastritis, unspecified, without bleeding: Secondary | ICD-10-CM | POA: Diagnosis not present

## 2022-03-24 DIAGNOSIS — R911 Solitary pulmonary nodule: Secondary | ICD-10-CM | POA: Diagnosis not present

## 2022-03-24 DIAGNOSIS — R109 Unspecified abdominal pain: Secondary | ICD-10-CM | POA: Diagnosis not present

## 2022-03-24 LAB — POC URINALSYSI DIPSTICK (AUTOMATED)
Bilirubin, UA: NEGATIVE
Blood, UA: NEGATIVE
Glucose, UA: POSITIVE — AB
Ketones, UA: NEGATIVE
Leukocytes, UA: NEGATIVE
Nitrite, UA: NEGATIVE
Protein, UA: NEGATIVE
Spec Grav, UA: 1.015 (ref 1.010–1.025)
Urobilinogen, UA: 0.2 E.U./dL
pH, UA: 5.5 (ref 5.0–8.0)

## 2022-03-24 MED ORDER — METHOCARBAMOL 500 MG PO TABS: 500.0000 mg | ORAL_TABLET | Freq: Three times a day (TID) | ORAL | 0 refills | Status: DC | PRN

## 2022-03-24 MED ORDER — FAMOTIDINE 20 MG PO TABS: 20.0000 mg | ORAL_TABLET | Freq: Two times a day (BID) | ORAL | Status: AC

## 2022-03-24 MED ORDER — DICLOFENAC SODIUM 1 % EX GEL: 2.0000 g | Freq: Three times a day (TID) | CUTANEOUS | 1 refills | Status: AC | PRN

## 2022-03-24 NOTE — Progress Notes (Signed)
Patient ID: GEORFFREY FOMBY, male    DOB: 1970-04-01, 52 y.o.   MRN: [id number]  This visit was conducted in person.  BP 128/84   Pulse 81   Temp (!) 97.3 F (36.3 C) (Temporal)   Ht 5\' 9"  (1.753 m)   Wt 182 lb (82.6 kg)   SpO2 96%   BMI 26.88 kg/m    CC: ER f/u visit  Subjective:   HPI: Augusto C Hurdle is a 52 y.o. male presenting on 03/24/2022 for Hospitalization Follow-up (Seen on 03/12/22 at [facility] ED, dx nausea and vomiting. Still c/o abd pain. )   Seen at ER at beginning of month with nausea, vomiting and abd pain. Family members also sick. Records reviewed. Thought viral gastritis, treated with zofran and fluids with benefit.   No new foods or restaurants.   Since home feeling better but does not ongoing persistent epigastric discomfort described as ache which is overall Improved. Nausea has since resolved. Never had diarrhea. No fevers/chills, abd cramping.   No UTI symptoms or lower abdominal pain.   Ongoing R shoulder discomfort without inciting trauma/injury - saw Dr Lorelei Pont last week thought RTC tendonitis, started on home exercise program. Pending PT referral.  He has been using ibuprofen 800mg  PRN (about twice daily).      Relevant past medical, surgical, family and social history reviewed and updated as indicated. Interim medical history since our last visit reviewed. Allergies and medications reviewed and updated. Outpatient Medications Prior to Visit  Medication Sig Dispense Refill   Accu-Chek FastClix Lancets MISC USE AS DIRECTED TO CHECK BLOOD SUGAR ONCE DAILY 100 each 0   atorvastatin (LIPITOR) 40 MG tablet Take 1 tablet (40 mg total) by mouth daily. 90 tablet 3   Blood Glucose Monitoring Suppl (ACCU-CHEK GUIDE) w/Device KIT 1 each by Does not apply route as needed. Use as directed to check blood sugar once daily 1 kit 0   Cholecalciferol (VITAMIN D3) 50 MCG (2000 UT) capsule Take 1 capsule (2,000 Units total) by mouth daily.     doxycycline  (VIBRA-TABS) 100 MG tablet Take 1 tablet (100 mg total) by mouth daily. 45 tablet 0   empagliflozin (JARDIANCE) 10 MG TABS tablet Take 1 tablet (10 mg total) by mouth daily before breakfast. 30 tablet 11   glucose blood (ACCU-CHEK GUIDE) test strip USE AS DIRECTED TO CHECK BLOOD SUGAR ONCE DAILY 100 strip 0   hydrOXYzine (ATARAX/VISTARIL) 25 MG tablet TAKE 1/2 TO 1 TABLET BY MOUTH 3 TIMES A DAY AS NEEDED FOR ITCHING (SEDATION PRECAUTIONS) 30 tablet 0   ibuprofen (ADVIL) 800 MG tablet Take 1 tablet (800 mg total) by mouth 3 (three) times daily as needed for moderate pain. 90 tablet 3   metFORMIN (GLUCOPHAGE) 500 MG tablet Take 1 tablet (500 mg total) by mouth 2 (two) times daily with a meal. 180 tablet 3   ondansetron (ZOFRAN-ODT) 4 MG disintegrating tablet Take 1 tablet (4 mg total) by mouth every 6 (six) hours as needed for nausea or vomiting. 15 tablet 0   pantoprazole (PROTONIX) 40 MG tablet Take 1 tablet (40 mg total) by mouth daily as needed (heartburn). 30 tablet 3   methocarbamol (ROBAXIN) 500 MG tablet TAKE 1 TABLET BY MOUTH THREE TIMES A DAY AS NEEDED FOR MUSCLE SPASMS 40 tablet 0   No facility-administered medications prior to visit.     Per HPI unless specifically indicated in ROS section below Review of Systems  Objective:  BP 128/84   Pulse  81   Temp (!) 97.3 F (36.3 C) (Temporal)   Ht 5\' 9"  (1.753 m)   Wt 182 lb (82.6 kg)   SpO2 96%   BMI 26.88 kg/m   Wt Readings from Last 3 Encounters:  03/24/22 182 lb (82.6 kg)  03/16/22 186 lb 8 oz (84.6 kg)  12/19/21 181 lb 2 oz (82.2 kg)      Physical Exam Vitals and nursing note reviewed.  Constitutional:      Appearance: Normal appearance. He is not ill-appearing.  HENT:     Mouth/Throat:     Comments: Wearing mask Eyes:     Extraocular Movements: Extraocular movements intact.     Conjunctiva/sclera: Conjunctivae normal.     Pupils: Pupils are equal, round, and reactive to light.  Cardiovascular:     Rate and Rhythm:  Normal rate and regular rhythm.     Pulses: Normal pulses.     Heart sounds: Normal heart sounds. No murmur heard. Pulmonary:     Effort: Pulmonary effort is normal. No respiratory distress.     Breath sounds: Normal breath sounds. No wheezing, rhonchi or rales.  Abdominal:     General: Bowel sounds are normal. There is no distension.     Palpations: Abdomen is soft. There is no mass.     Tenderness: There is abdominal tenderness (mild) in the epigastric area. There is no right CVA tenderness, left CVA tenderness, guarding or rebound. Negative signs include Murphy's sign.     Hernia: No hernia is present.  Musculoskeletal:     Right lower leg: No edema.     Left lower leg: No edema.  Skin:    General: Skin is warm and dry.  Neurological:     Mental Status: He is alert.  Psychiatric:        Mood and Affect: Mood normal.        Behavior: Behavior normal.       Results for orders placed or performed in visit on 03/24/22  POCT Urinalysis Dipstick (Automated)  Result Value Ref Range   Color, UA yellow    Clarity, UA clear    Glucose, UA Positive (A) Negative   Bilirubin, UA negative    Ketones, UA negative    Spec Grav, UA 1.015 1.010 - 1.025   Blood, UA negative    pH, UA 5.5 5.0 - 8.0   Protein, UA Negative Negative   Urobilinogen, UA 0.2 0.2 or 1.0 E.U./dL   Nitrite, UA negative    Leukocytes, UA Negative Negative    Assessment & Plan:   Problem List Items Addressed This Visit     Pulmonary nodule    Seeing Dr Lorelei Pont undergoing treatment for impingement syndrome with rotator cuff tendonitis.  Rec topical voltaren gel.       Viral gastritis - Primary    ER records reviewed - story most consistent with viral gastritis as family ill with similar symptoms. Never diarrhea.  Overall feeling better, vomiting has resolved however he notes ongoing intermittent upper abdominal discomfort. He has been taking ibuprofen for R shoulder pain recently evaluated by sports medicine.   This could contribute to GI discomfort - recommend he start pepcid 20mg  daily to BID depending on if he takes NSAID.  Update if not improving with treatment.       Other Visit Diagnoses     Abdominal pain, unspecified abdominal location       Relevant Orders   POCT Urinalysis Dipstick (Automated) (Completed)  Meds ordered this encounter  Medications   methocarbamol (ROBAXIN) 500 MG tablet    Sig: Take 1 tablet (500 mg total) by mouth 3 (three) times daily as needed for muscle spasms (sedation precautions).    Dispense:  40 tablet    Refill:  0   diclofenac Sodium (VOLTAREN) 1 % GEL    Sig: Apply 2 g topically 3 (three) times daily as needed (to affected joint).    Dispense:  100 g    Refill:  1   famotidine (PEPCID) 20 MG tablet    Sig: Take 1 tablet (20 mg total) by mouth 2 (two) times daily.    Orders Placed This Encounter  Procedures   POCT Urinalysis Dipstick (Automated)    Patient Instructions  Start pepcid 20mg  1-2 times daily as needed for next few weeks (OTC).  For right shoulder pain - retry voltaren gel - refilled today.  Good to see you today Let us know if not improving as expected.  Follow up plan: Return if symptoms worsen or fail to improve.  Ria Bush, MD

## 2022-03-24 NOTE — Patient Instructions (Addendum)
Start pepcid 20mg  1-2 times daily as needed for next few weeks (OTC).  For right shoulder pain - retry voltaren gel - refilled today.  Good to see you today Let us know if not improving as expected.

## 2022-03-24 NOTE — Assessment & Plan Note (Addendum)
ER records reviewed - story most consistent with viral gastritis as family ill with similar symptoms. Never diarrhea.  Overall feeling better, vomiting has resolved however he notes ongoing intermittent upper abdominal discomfort. He has been taking ibuprofen for R shoulder pain recently evaluated by sports medicine.  This could contribute to GI discomfort - recommend he start pepcid 20mg  daily to BID depending on if he takes NSAID.  Update if not improving with treatment.

## 2022-03-24 NOTE — Assessment & Plan Note (Addendum)
Seeing Dr Lorelei Pont undergoing treatment for impingement syndrome with rotator cuff tendonitis.  Rec topical voltaren gel.

## 2022-04-04 ENCOUNTER — Ambulatory Visit: Payer: 59

## 2022-04-04 NOTE — Therapy (Unsigned)
Erroneous encounter - patient cancelled this PT evaluation

## 2022-04-15 ENCOUNTER — Other Ambulatory Visit: Payer: Self-pay | Admitting: Family Medicine

## 2022-04-15 DIAGNOSIS — E1169 Type 2 diabetes mellitus with other specified complication: Secondary | ICD-10-CM

## 2022-04-20 NOTE — Therapy (Signed)
OUTPATIENT PHYSICAL THERAPY SHOULDER EVALUATION   Patient Name: Gary Long MRN: [id number] DOB:06/12/1970, 52 y.o., male Today's Date: 04/24/2022  END OF SESSION:  PT End of Session - 04/24/22 0941     Visit Number 1    Number of Visits 13    Date for PT Re-Evaluation 06/05/22    Authorization Type aetna    Authorization Time Period 30 visit annual limit    PT Start Time [phone]   late arrival   PT Stop Time 1018    PT Time Calculation (min) 35 min    Activity Tolerance Patient tolerated treatment well;Patient limited by pain    Behavior During Therapy [facility] for tasks assessed/performed             Past Medical History:  Diagnosis Date   Diabetes mellitus type 2, controlled, without complications 11/27/2013   History of malaria 1999   HLD (hyperlipidemia) 06/13/2013   Hyperlipidemia    Past Surgical History:  Procedure Laterality Date   COLONOSCOPY  02/2021   WNL, done for pos iFOB, rpt 10 yrs ([facility])   KNEE ARTHROSCOPY Right 01/10/2000   ?ACL repair   KNEE ARTHROSCOPY WITH DRILLING/MICROFRACTURE Right 06/21/2016   Procedure: KNEE ARTHROSCOPY WITH DRILLING/MICROFRACTURE;  Surgeon: Tarry Kos, MD;  Location: [facility];  Service: Orthopedics;  Laterality: Right;   KNEE ARTHROSCOPY WITH MEDIAL MENISECTOMY Right 06/21/2016   Procedure: RIGHT KNEE ARTHROSCOPY WITH PARTIAL MEDIAL AND LATERAL MENISCECTOMIES, SYNOVECTOMY,  MICROFRACTURE, SUBCHONDROPLASTY;  Surgeon: Tarry Kos, MD;  Location: [facility];  Service: Orthopedics;  Laterality: Right;   KNEE ARTHROSCOPY WITH SUBCHONDROPLASTY Right 06/21/2016   Procedure: KNEE ARTHROSCOPY WITH SUBCHONDROPLASTY;  Surgeon: Tarry Kos, MD;  Location: [facility];  Service: Orthopedics;  Laterality: Right;   Patient Active Problem List   Diagnosis Date Noted   Viral gastritis 03/24/2022   Right shoulder pain 12/09/2021   Recurrent infections 12/09/2021   Vitamin D deficiency  11/27/2021   Left anterior knee pain 11/17/2020   Thrombocytopenia 11/17/2020   Right-sided thoracic back pain 10/22/2020   Pulmonary nodule 10/22/2020   ED (erectile dysfunction) 05/28/2020   Right temporal headache 11/28/2019   Seasonal allergic rhinitis 03/29/2018   Travel advice encounter 10/29/2017   GERD (gastroesophageal reflux disease) 10/24/2016   Complex tear of medial meniscus of right knee as current injury 06/15/2016   Unilateral primary osteoarthritis, right knee 06/15/2016   Palpitations 05/10/2016   Transaminitis 05/10/2016   Encounter for general adult medical examination with abnormal findings 11/27/2013   Type 2 diabetes mellitus with other specified complication 11/27/2013   Frequent urination 11/27/2013   Premature ejaculation 11/27/2013   Lateral meniscal tear 11/27/2013   Hyperlipidemia associated with type 2 diabetes mellitus 06/13/2013    PCP: Eustaquio Boyden, MD  REFERRING PROVIDER: Hannah Beat, MD  REFERRING DIAG: M75.41 (ICD-10-CM) - Impingement syndrome of right shoulder M75.81 (ICD-10-CM) - Rotator cuff tendonitis, right  THERAPY DIAG:  Right shoulder pain, unspecified chronicity  Stiffness of right shoulder, not elsewhere classified  Muscle weakness (generalized)  Rationale for Evaluation and Treatment: Rehabilitation  ONSET DATE: 3-4 months ago  SUBJECTIVE:  SUBJECTIVE STATEMENT: Pt states about 3-4 months ago he developed R shoulder pain. Denies any MOI or change in activity but does note he has had longstanding neck pain that has worsened with onset of shoulder pain. Also endorses prior L shoulder issues that were similar but less persistent and resolved w/ injection ~2 years ago.  Symptoms mostly in anterior shoulder although occasionally refer laterally,  proximal to elbow. No N/T or distal UE referral. Symptoms aggravated by lifting/reaching and are limiting ADLs and work activities, pt denies issues sleeping unless he rolls onto his R side.  Notes he has been doing an already established HEP with resistance bands that seems to be irritating symptoms.  Hand dominance: Right  PERTINENT HISTORY: DM2, GERD, previous R knee surgeries  PAIN:  Are you having pain: 8/10 Location/description: R anterior shoulder Best-worst over past week: 0-8/10 - aggravating factors: reaching/raising arm, lifting especially overhead, sleeping on R side, hygiene - Easing factors: rest, mild relief from steroid injection  PRECAUTIONS: None  WEIGHT BEARING RESTRICTIONS: No  FALLS:  Has patient fallen in last 6 months? No  LIVING ENVIRONMENT: Lives w/ family, 2 stories. Housework divided between everyone  OCCUPATION: Works in [employer], commissioning, lots of standing  PLOF: Independent  PATIENT GOALS: reduce pain  NEXT MD VISIT: Next month  OBJECTIVE:   DIAGNOSTIC FINDINGS:  No recent imaging per chart or pt   PATIENT SURVEYS:  FOTO 46 current, 41 predicted  COGNITION: Overall cognitive status: Within functional limits for tasks assessed     SENSATION: Light touch intact B UE  POSTURE: R UT elevated, R UE held in slightly guarded posture   CERVICAL SCREEN: Grossly symmetrical rotation and WNL although rotation either direction does elicit R neck/shoulder pain (along R UT)  UPPER EXTREMITY ROM:  A/PROM Right eval Left eval  Shoulder flexion 122 deg * 165 deg  Shoulder abduction 94 deg * 168deg  Shoulder internal rotation (functional combo) NT T5  Shoulder external rotation (functional combo) NT T3  Elbow flexion    Elbow extension    Wrist flexion    Wrist extension     (Blank rows = not tested) (Key: WFL = within functional limits not formally assessed, * = concordant pain, s = stiffness/stretching sensation, NT = not tested)  Comments:  further ROM deferred given symptom irritability  UPPER EXTREMITY MMT:  MMT Right eval Left eval  Shoulder flexion    Shoulder extension    Shoulder abduction    Shoulder extension    Shoulder internal rotation 3+ * 5  Shoulder external rotation 4+ 5  Elbow flexion 4+ mild pain 5  Elbow extension    Grip strength    (Blank rows = not tested)  (Key: WFL = within functional limits not formally assessed, * = concordant pain, s = stiffness/stretching sensation, NT = not tested)  Comments: further MMT deferred given symptom irritability  SHOULDER SPECIAL TESTS: Deferred given symptom irritability w/ ROM/MMT  JOINT MOBILITY TESTING:  deferred  PALPATION:  General tightness/tenderness throughout R UT, LS. TTP bicipital groove, anterior/lateral deltoid and biceps. Tenderness R lat and teres, pec minor   TODAY'S TREATMENT:  [facility] Adult PT Treatment:                                                DATE: 04/24/22 Therapeutic Exercise: Scapular retractions x10 cues for HEP and posture Shoulder IR iso x5 cues for appropriate force output, pain free exertion HEP handout + education   PATIENT EDUCATION: Education details: Pt education on PT impairments, prognosis, and POC. Informed consent. Rationale for interventions, safe/appropriate HEP performance Person educated: Patient Education method: Explanation, Demonstration, Tactile cues, Verbal cues, and Handouts Education comprehension: verbalized understanding, returned demonstration, verbal cues required, tactile cues required, and needs further education    HOME EXERCISE PROGRAM: Access Code: [email or web address] URL: [email or web address] Date: 04/24/2022 Prepared by: Fransisco Hertz  Exercises - Seated Scapular Retraction  - 1 x daily - 7 x weekly - 2 sets - 10 reps - Standing Isometric Shoulder  Internal Rotation at Doorway  - 1 x daily - 7 x weekly - 2 sets - 5 reps  ASSESSMENT:  CLINICAL IMPRESSION: Patient is a 52 y.o. gentleman who was seen today for physical therapy evaluation and treatment for R shoulder pain. Pt endorses onset of pain 3-4 months ago without MOI or change in activity, has had prior neck pain which has worsened with shoulder pain. Endorses limitations in ADLs and work activities due to pain. Today pt demos reduced GH AROM and strength on RUE, no sensory issues. Grossly WNL cervical rotation although this does irritate symptoms. Prescribed initial HEP which pt performs with some discomfort that he describes as non-worsening, emphasis on comfortable ROM and safe performance at home. No adverse events, exam is limited by symptom irritability. Recommend trial of skilled PT as outlined below in order to maximize functional tolerance and return to PLOF. Pt departs today's session in no acute distress, all voiced questions/concerns addressed appropriately from PT perspective.      OBJECTIVE IMPAIRMENTS: decreased activity tolerance, decreased endurance, decreased mobility, decreased ROM, decreased strength, hypomobility, impaired UE functional use, postural dysfunction, and pain.   ACTIVITY LIMITATIONS: carrying, lifting, sleeping, bathing, dressing, reach over head, and hygiene/grooming  PARTICIPATION LIMITATIONS: meal prep, cleaning, laundry, driving, community activity, and occupation  PERSONAL FACTORS: Time since onset of injury/illness/exacerbation and 1 comorbidity: DM  are also affecting patient's functional outcome.   REHAB POTENTIAL: Good  CLINICAL DECISION MAKING: Stable/uncomplicated  EVALUATION COMPLEXITY: Low   GOALS: Goals reviewed with patient? No given time constraints   SHORT TERM GOALS: Target date: 05/15/2022 Pt will demonstrate appropriate understanding and performance of initially prescribed HEP in order to facilitate improved independence with  management of symptoms.  Baseline: HEP provided on eval Goal status: INITIAL   2. Pt will score greater than or equal to 56 on FOTO in order to demonstrate improved perception of function due to symptoms.  Baseline: 46  Goal status: INITIAL     LONG TERM GOALS: Target date: 06/05/2022 Pt will score 67 on FOTO in order to demonstrate improved perception of function due to symptoms. Baseline: 46 Goal status: INITIAL  2.  Pt will demonstrate at least 150 degrees of R active shoulder elevation in order to demonstrate improved tolerance to functional movement patterns such as reaching overhead.  Baseline: see ROM chart above Goal status: INITIAL  3.  Pt will demonstrate at least 4+/5 shoulder IR MMT BIL for improved symmetry of UE strength and improved tolerance  to functional movements.  Baseline: see MMT chart above Goal status: INITIAL  4. Pt will report ability to lift up to 10# with less than 2 pt increase in pain on NPS in order to improve tolerance to household tasks such as dishes/laundry.   Baseline: avoiding lifting due to pain  Goal status: INITIAL   5. Pt will report at least 50% decrease in overall pain levels in past week in order to facilitate improved tolerance to basic ADLs/mobility.   Baseline: 0-8/10  Goal status: INITIAL    PLAN:  PT FREQUENCY: 2x/week  PT DURATION: 6 weeks  PLANNED INTERVENTIONS: Therapeutic exercises, Therapeutic activity, Neuromuscular re-education, Balance training, Gait training, Patient/Family education, Self Care, Joint mobilization, Joint manipulation, Stair training, Aquatic Therapy, Dry Needling, Electrical stimulation, Spinal manipulation, Spinal mobilization, Cryotherapy, Moist heat, Taping, Manual therapy, and Re-evaluation  PLAN FOR NEXT SESSION: emphasis on postural endurance/stability. Gentle GH mobility as able/tolerated   Ashley Murrainavid A Mancil Pfenning PT, DPT 04/24/2022 10:39 AM

## 2022-04-24 ENCOUNTER — Ambulatory Visit: Payer: 59 | Attending: Family Medicine | Admitting: Physical Therapy

## 2022-04-24 ENCOUNTER — Encounter: Payer: Self-pay | Admitting: Physical Therapy

## 2022-04-24 ENCOUNTER — Other Ambulatory Visit: Payer: Self-pay

## 2022-04-24 DIAGNOSIS — M6281 Muscle weakness (generalized): Secondary | ICD-10-CM | POA: Diagnosis present

## 2022-04-24 DIAGNOSIS — M7581 Other shoulder lesions, right shoulder: Secondary | ICD-10-CM | POA: Insufficient documentation

## 2022-04-24 DIAGNOSIS — M25611 Stiffness of right shoulder, not elsewhere classified: Secondary | ICD-10-CM | POA: Diagnosis present

## 2022-04-24 DIAGNOSIS — M25511 Pain in right shoulder: Secondary | ICD-10-CM | POA: Insufficient documentation

## 2022-04-24 DIAGNOSIS — M7541 Impingement syndrome of right shoulder: Secondary | ICD-10-CM | POA: Diagnosis not present

## 2022-05-05 ENCOUNTER — Ambulatory Visit: Payer: 59

## 2022-05-05 DIAGNOSIS — M25511 Pain in right shoulder: Secondary | ICD-10-CM

## 2022-05-05 DIAGNOSIS — M6281 Muscle weakness (generalized): Secondary | ICD-10-CM

## 2022-05-05 DIAGNOSIS — M25611 Stiffness of right shoulder, not elsewhere classified: Secondary | ICD-10-CM

## 2022-05-05 NOTE — Therapy (Signed)
OUTPATIENT PHYSICAL THERAPY TREATMENT NOTE   Patient Name: Gary Long MRN: [id number] DOB:[birth year], 52 y.o., male Today's Date: 05/05/2022  PCP: Eustaquio Boyden, MD  REFERRING PROVIDER: Hannah Beat, MD  END OF SESSION:   PT End of Session - 05/05/22 1206     Visit Number 2    Number of Visits 13    PT Start Time 1216    PT Stop Time 1258    PT Time Calculation (min) 42 min    Activity Tolerance Patient tolerated treatment well;Patient limited by pain    Behavior During Therapy [facility] for tasks assessed/performed             Past Medical History:  Diagnosis Date   Diabetes mellitus type 2, controlled, without complications (HCC) 11/27/2013   History of malaria 1999   HLD (hyperlipidemia) 06/13/2013   Hyperlipidemia    Past Surgical History:  Procedure Laterality Date   COLONOSCOPY  02/2021   WNL, done for pos iFOB, rpt 10 yrs ([facility])   KNEE ARTHROSCOPY Right 01/10/2000   ?ACL repair   KNEE ARTHROSCOPY WITH DRILLING/MICROFRACTURE Right 06/21/2016   Procedure: KNEE ARTHROSCOPY WITH DRILLING/MICROFRACTURE;  Surgeon: Tarry Kos, MD;  Location: [facility];  Service: Orthopedics;  Laterality: Right;   KNEE ARTHROSCOPY WITH MEDIAL MENISECTOMY Right 06/21/2016   Procedure: RIGHT KNEE ARTHROSCOPY WITH PARTIAL MEDIAL AND LATERAL MENISCECTOMIES, SYNOVECTOMY,  MICROFRACTURE, SUBCHONDROPLASTY;  Surgeon: Tarry Kos, MD;  Location: [facility];  Service: Orthopedics;  Laterality: Right;   KNEE ARTHROSCOPY WITH SUBCHONDROPLASTY Right 06/21/2016   Procedure: KNEE ARTHROSCOPY WITH SUBCHONDROPLASTY;  Surgeon: Tarry Kos, MD;  Location:  [facility];  Service: Orthopedics;  Laterality: Right;   Patient Active Problem List   Diagnosis Date Noted   Viral gastritis 03/24/2022   Right shoulder pain 12/09/2021   Recurrent infections 12/09/2021   Vitamin D deficiency 11/27/2021   Left anterior knee pain 11/17/2020    Thrombocytopenia (HCC) 11/17/2020   Right-sided thoracic back pain 10/22/2020   Pulmonary nodule 10/22/2020   ED (erectile dysfunction) 05/28/2020   Right temporal headache 11/28/2019   Seasonal allergic rhinitis 03/29/2018   Travel advice encounter 10/29/2017   GERD (gastroesophageal reflux disease) 10/24/2016   Complex tear of medial meniscus of right knee as current injury 06/15/2016   Unilateral primary osteoarthritis, right knee 06/15/2016   Palpitations 05/10/2016   Transaminitis 05/10/2016   Encounter for general adult medical examination with abnormal findings 11/27/2013   Type 2 diabetes mellitus with other specified complication (HCC) 11/27/2013   Frequent urination 11/27/2013   Premature ejaculation 11/27/2013   Lateral meniscal tear 11/27/2013   Hyperlipidemia associated with type 2 diabetes mellitus (HCC) 06/13/2013    REFERRING DIAG: M75.41 (ICD-10-CM) - Impingement syndrome of right shoulder M75.81 (ICD-10-CM) - Rotator cuff tendonitis, right  THERAPY DIAG:  Right shoulder pain, unspecified chronicity  Stiffness of right shoulder, not elsewhere classified  Muscle weakness (generalized)  Rationale for Evaluation and Treatment Rehabilitation  PERTINENT HISTORY: DM2, GERD, previous R knee surgeries  SUBJECTIVE:  SUBJECTIVE STATEMENT: Patient reports that he has had no change in his symptoms since last visit. He has been compliant with HEP. He states that he is waking from pain if he lays on the affected side.   Hand dominance: Right   PAIN:  Are you having pain: 8/10 Location/description: R anterior shoulder Best-worst over past week: 0-8/10 aggravating factors: reaching/raising arm, lifting especially overhead, sleeping on R side, hygiene Easing factors: rest, mild relief from  steroid injection   OBJECTIVE: (objective measures completed at initial evaluation unless otherwise dated)   DIAGNOSTIC FINDINGS:  No recent imaging per chart or pt   PATIENT SURVEYS:  FOTO 46 current, 87 predicted  COGNITION: Overall cognitive status: Within functional limits for tasks assessed     SENSATION: Light touch intact B UE  POSTURE: R UT elevated, R UE held in slightly guarded posture   CERVICAL SCREEN: Grossly symmetrical rotation and WNL although rotation either direction does elicit R neck/shoulder pain (along R UT)  UPPER EXTREMITY ROM:  A/PROM Right eval Left eval  Shoulder flexion 122 deg * 165 deg  Shoulder abduction 94 deg * 168deg  Shoulder internal rotation (functional combo) NT T5  Shoulder external rotation (functional combo) NT T3  Elbow flexion    Elbow extension    Wrist flexion    Wrist extension     (Blank rows = not tested) (Key: WFL = within functional limits not formally assessed, * = concordant pain, s = stiffness/stretching sensation, NT = not tested)  Comments: further ROM deferred given symptom irritability  UPPER EXTREMITY MMT:  MMT Right eval Left eval  Shoulder flexion    Shoulder extension    Shoulder abduction    Shoulder extension    Shoulder internal rotation 3+ * 5  Shoulder external rotation 4+ 5  Elbow flexion 4+ mild pain 5  Elbow extension    Grip strength    (Blank rows = not tested)  (Key: WFL = within functional limits not formally assessed, * = concordant pain, s = stiffness/stretching sensation, NT = not tested)  Comments: further MMT deferred given symptom irritability  SHOULDER SPECIAL TESTS: Deferred given symptom irritability w/ ROM/MMT  JOINT MOBILITY TESTING:  deferred  PALPATION:  General tightness/tenderness throughout R UT, LS. TTP bicipital groove, anterior/lateral deltoid and biceps. Tenderness R lat and teres, pec minor  [facility] Adult PT Treatment:                                                 DATE: 05/05/2022  Therapeutic Exercise: AAROM Standing Physioball Rolling x 15 forward  Shoulder Isometrics (flexion/abduction, IR/ER, extension), 5 x 5 sec each  Reviewed and updated HEP to include 4-way shoulder isometrics   Manual Therapy: Long-Axis Distraction  PROM flexion, abduction, scaption   Neuromuscular Reeducation: Pain Education regarding important of management activities at home, belief that exacerbating factors are the only helpful factors, primary goals of therapy at this point in POC, regional interdependence of neck pain.   Modalities: Moist Heat Pack x 8 minutes at start of session for muscle relaxation and pain modulation.    TODAY'S TREATMENT:  [facility] Adult PT Treatment:                                                DATE: 04/24/22 Therapeutic Exercise: Scapular retractions x10 cues for HEP and posture Shoulder IR iso x5 cues for appropriate force output, pain free exertion HEP handout + education   PATIENT EDUCATION: Education details: Pt education on PT impairments, prognosis, and POC. Informed consent. Rationale for interventions, safe/appropriate HEP performance Person educated: Patient Education method: Explanation, Demonstration, Tactile cues, Verbal cues, and Handouts Education comprehension: verbalized understanding, returned demonstration, verbal cues required, tactile cues required, and needs further education    HOME EXERCISE PROGRAM: Access Code: [email or web address] URL: [email or web address] Date: 04/24/2022 Prepared by: Fransisco Hertz  Exercises - Seated Scapular Retraction  - 1 x daily - 7 x weekly - 2 sets - 10 reps - Standing Isometric Shoulder Internal Rotation at Doorway  - 1 x daily - 7 x weekly - 2 sets - 5 reps  ASSESSMENT:  CLINICAL IMPRESSION: Patient was able to demonstrate at least 160  degrees of AAROM with exercises today and is demonstrating full AAROM in supine position. He has pain when he reaches about 90 degrees of shoulder elevation, and is limited by pain and pain beliefs at this time. We discussed the goals of PT at this point in his POC and provided patient education regarding pain management. Educated patient regarding goals of decreasing pain and increasing function with recommendation to decrease participation in exacerbation activities at this time. He otherwise tolerated today's session very well with improved mobility at end of session. He continues to feel limited by persistent neck pain. We will continue to progress as appropriate.    OBJECTIVE IMPAIRMENTS: decreased activity tolerance, decreased endurance, decreased mobility, decreased ROM, decreased strength, hypomobility, impaired UE functional use, postural dysfunction, and pain.   ACTIVITY LIMITATIONS: carrying, lifting, sleeping, bathing, dressing, reach over head, and hygiene/grooming  PARTICIPATION LIMITATIONS: meal prep, cleaning, laundry, driving, community activity, and occupation  PERSONAL FACTORS: Time since onset of injury/illness/exacerbation and 1 comorbidity: DM are also affecting patient's functional outcome.   REHAB POTENTIAL: Good  CLINICAL DECISION MAKING: Stable/uncomplicated  EVALUATION COMPLEXITY: Low   GOALS: Goals reviewed with patient? No given time constraints   SHORT TERM GOALS: Target date: 05/15/2022 Pt will demonstrate appropriate understanding and performance of initially prescribed HEP in order to facilitate improved independence with management of symptoms.  Baseline: HEP provided on eval Goal status: INITIAL   2. Pt will score greater than or equal to 56 on FOTO in order to demonstrate improved perception of function due to symptoms.  Baseline: 46  Goal status: INITIAL     LONG TERM GOALS: Target date: 06/05/2022 Pt will score 67 on FOTO in order to demonstrate  improved perception of function due to symptoms. Baseline: 46 Goal status: INITIAL  2.  Pt will demonstrate at least 150 degrees of R active shoulder elevation in order to demonstrate improved tolerance to functional movement patterns such as reaching overhead.  Baseline: see ROM chart above Goal status: INITIAL  3.  Pt will demonstrate at least 4+/5 shoulder IR MMT BIL for improved symmetry of UE strength and improved tolerance to functional movements.  Baseline: see MMT chart above Goal status: INITIAL  4. Pt will report ability to lift up to 10# with less than 2 pt increase in pain  on NPS in order to improve tolerance to household tasks such as dishes/laundry.   Baseline: avoiding lifting due to pain  Goal status: INITIAL   5. Pt will report at least 50% decrease in overall pain levels in past week in order to facilitate improved tolerance to basic ADLs/mobility.   Baseline: 0-8/10  Goal status: INITIAL    PLAN:  PT FREQUENCY: 2x/week  PT DURATION: 6 weeks  PLANNED INTERVENTIONS: Therapeutic exercises, Therapeutic activity, Neuromuscular re-education, Balance training, Gait training, Patient/Family education, Self Care, Joint mobilization, Joint manipulation, Stair training, Aquatic Therapy, Dry Needling, Electrical stimulation, Spinal manipulation, Spinal mobilization, Cryotherapy, Moist heat, Taping, Manual therapy, and Re-evaluation  PLAN FOR NEXT SESSION: emphasis on postural endurance/stability. Gentle GH mobility as able/tolerated.    Mauri Reading, PT, DPT 05/05/2022, 2:44 PM

## 2022-05-06 ENCOUNTER — Other Ambulatory Visit: Payer: Self-pay | Admitting: Family Medicine

## 2022-05-08 NOTE — Telephone Encounter (Signed)
Last office visit 03/24/2022 for viral gastritis.  Last refilled 12/19/2021 for #90 with 3 refills by Dr. Patsy Lager.  Next Appt: 06/07/2022 for DM.

## 2022-05-09 NOTE — Telephone Encounter (Signed)
ERx 

## 2022-05-12 ENCOUNTER — Ambulatory Visit: Payer: 59 | Attending: Family Medicine

## 2022-05-12 DIAGNOSIS — M6281 Muscle weakness (generalized): Secondary | ICD-10-CM | POA: Insufficient documentation

## 2022-05-12 DIAGNOSIS — M25511 Pain in right shoulder: Secondary | ICD-10-CM | POA: Diagnosis present

## 2022-05-12 DIAGNOSIS — M25611 Stiffness of right shoulder, not elsewhere classified: Secondary | ICD-10-CM | POA: Diagnosis present

## 2022-05-12 NOTE — Therapy (Addendum)
OUTPATIENT PHYSICAL THERAPY TREATMENT NOTE    PHYSICAL THERAPY DISCHARGE SUMMARY  Visits from Start of Care: 3  Patient is being discharged due to not returning since last visit (>30 days)     Patient Name: Gary Long MRN: [id number] DOB:04/15/1970, 52 y.o., male Today's Date: 05/12/2022  PCP: Eustaquio Boyden, MD  REFERRING PROVIDER: Hannah Beat, MD  END OF SESSION:   PT End of Session - 05/12/22 1218     Visit Number 3    Number of Visits 13    PT Start Time 1220    PT Stop Time 1259    PT Time Calculation (min) 39 min    Activity Tolerance Patient tolerated treatment well;Patient limited by pain    Behavior During Therapy [facility] for tasks assessed/performed              Past Medical History:  Diagnosis Date   Diabetes mellitus type 2, controlled, without complications (HCC) 11/27/2013   History of malaria 1999   HLD (hyperlipidemia) 06/13/2013   Hyperlipidemia    Past Surgical History:  Procedure Laterality Date   COLONOSCOPY  02/2021   WNL, done for pos iFOB, rpt 10 yrs ([facility])   KNEE ARTHROSCOPY Right 01/10/2000   ?ACL repair   KNEE ARTHROSCOPY WITH DRILLING/MICROFRACTURE Right 06/21/2016   Procedure: KNEE ARTHROSCOPY WITH DRILLING/MICROFRACTURE;  Surgeon: Tarry Kos, MD;  Location: [facility];  Service: Orthopedics;  Laterality: Right;   KNEE ARTHROSCOPY WITH MEDIAL MENISECTOMY Right 06/21/2016   Procedure: RIGHT KNEE ARTHROSCOPY WITH PARTIAL MEDIAL AND LATERAL MENISCECTOMIES, SYNOVECTOMY,  MICROFRACTURE, SUBCHONDROPLASTY;  Surgeon: Tarry Kos, MD;  Location: [facility];  Service: Orthopedics;  Laterality: Right;   KNEE ARTHROSCOPY WITH SUBCHONDROPLASTY Right 06/21/2016   Procedure: KNEE ARTHROSCOPY WITH SUBCHONDROPLASTY;  Surgeon: Tarry Kos, MD;  Location: [facility];  Service: Orthopedics;  Laterality: Right;   Patient Active Problem List   Diagnosis Date Noted   Viral gastritis  03/24/2022   Right shoulder pain 12/09/2021   Recurrent infections 12/09/2021   Vitamin D deficiency 11/27/2021   Left anterior knee pain 11/17/2020   Thrombocytopenia (HCC) 11/17/2020   Right-sided thoracic back pain 10/22/2020   Pulmonary nodule 10/22/2020   ED (erectile dysfunction) 05/28/2020   Right temporal headache 11/28/2019   Seasonal allergic rhinitis 03/29/2018   Travel advice encounter 10/29/2017   GERD (gastroesophageal reflux disease) 10/24/2016   Complex tear of medial meniscus of right knee as current injury 06/15/2016   Unilateral primary osteoarthritis, right knee 06/15/2016   Palpitations 05/10/2016   Transaminitis 05/10/2016   Encounter for general adult medical examination with abnormal findings 11/27/2013   Type 2 diabetes mellitus with other specified complication (HCC) 11/27/2013   Frequent urination 11/27/2013   Premature ejaculation 11/27/2013   Lateral meniscal tear 11/27/2013   Hyperlipidemia associated with type 2 diabetes mellitus (HCC) 06/13/2013    REFERRING DIAG: M75.41 (ICD-10-CM) - Impingement syndrome of right shoulder M75.81 (ICD-10-CM) - Rotator cuff tendonitis, right  THERAPY DIAG:  Right shoulder pain, unspecified chronicity  Stiffness of right shoulder, not elsewhere classified  Muscle weakness (generalized)  Rationale for Evaluation and Treatment Rehabilitation  PERTINENT HISTORY: DM2, GERD, previous R knee surgeries  SUBJECTIVE:  SUBJECTIVE STATEMENT: Patient report that he can feel that his R UE is weaker than the left.   Hand dominance: Right   PAIN:  Are you having pain: 8/10 Location/description: R anterior shoulder Best-worst over past week: 0-8/10 aggravating factors: reaching/raising arm, lifting especially overhead, sleeping on R side,  hygiene Easing factors: rest, mild relief from steroid injection   OBJECTIVE: (objective measures completed at initial evaluation unless otherwise dated)   DIAGNOSTIC FINDINGS:  No recent imaging per chart or pt   PATIENT SURVEYS:  FOTO 46 current, 66 predicted  COGNITION: Overall cognitive status: Within functional limits for tasks assessed     SENSATION: Light touch intact B UE  POSTURE: R UT elevated, R UE held in slightly guarded posture   CERVICAL SCREEN: Grossly symmetrical rotation and WNL although rotation either direction does elicit R neck/shoulder pain (along R UT)  UPPER EXTREMITY ROM:  A/PROM Right eval Left eval  Shoulder flexion 122 deg * 165 deg  Shoulder abduction 94 deg * 168deg  Shoulder internal rotation (functional combo) NT T5  Shoulder external rotation (functional combo) NT T3  Elbow flexion    Elbow extension    Wrist flexion    Wrist extension     (Blank rows = not tested) (Key: WFL = within functional limits not formally assessed, * = concordant pain, s = stiffness/stretching sensation, NT = not tested)  Comments: further ROM deferred given symptom irritability  UPPER EXTREMITY MMT:  MMT Right eval Left eval  Shoulder flexion    Shoulder extension    Shoulder abduction    Shoulder extension    Shoulder internal rotation 3+ * 5  Shoulder external rotation 4+ 5  Elbow flexion 4+ mild pain 5  Elbow extension    Grip strength     (Blank rows = not tested) (Key: WFL = within functional limits not formally assessed, * = concordant pain, s = stiffness/stretching sensation, NT = not tested) Comments: further MMT deferred given symptom irritability  SHOULDER SPECIAL TESTS: Deferred given symptom irritability w/ ROM/MMT  JOINT MOBILITY TESTING:  deferred  PALPATION:  General tightness/tenderness throughout R UT, LS. TTP bicipital groove, anterior/lateral deltoid and biceps. Tenderness R lat and teres, pec minor   [facility] Adult PT  Treatment:                                                DATE: 05/12/2022  Therapeutic Exercise: UBE level 2, 3' fwd/3' back AAROM Standing Physioball Rolling x 10 each forward, left/right diagonal   Standing rows with green theraband, x15 Standing shoulder extension with green theraband, x15 Standing shoulder isometrics - 4 ways, 15 x 5 sec   Manual Therapy: Long-Axis Distraction  PROM flexion, abduction, scaption  Soft Tissue Mobilization    [facility] Adult PT Treatment:                                                DATE: 05/05/2022  Therapeutic Exercise: AAROM Standing Physioball Rolling x 15 forward  Shoulder Isometrics (flexion/abduction, IR/ER, extension), 5 x 5 sec each  Reviewed and updated HEP to include 4-way shoulder isometrics   Manual Therapy: Long-Axis Distraction  PROM flexion, abduction, scaption   Neuromuscular Reeducation: Pain Education regarding  important of management activities at home, belief that exacerbating factors are the only helpful factors, primary goals of therapy at this point in POC, regional interdependence of neck pain.   Modalities: Moist Heat Pack x 8 minutes at start of session for muscle relaxation and pain modulation.      PATIENT EDUCATION: Education details: Pt education on PT impairments, prognosis, and POC. Informed consent. Rationale for interventions, safe/appropriate HEP performance Person educated: Patient Education method: Explanation, Demonstration, Tactile cues, Verbal cues, and Handouts Education comprehension: verbalized understanding, returned demonstration, verbal cues required, tactile cues required, and needs further education    HOME EXERCISE PROGRAM: Access Code: [email or web address] URL: [email or web address] Date: 04/24/2022 Prepared by: Fransisco Hertz  Exercises - Seated Scapular Retraction  - 1 x daily - 7 x weekly - 2 sets - 10 reps - Standing Isometric Shoulder Internal Rotation at Doorway  - 1 x daily - 7 x  weekly - 2 sets - 5 reps  ASSESSMENT:  CLINICAL IMPRESSION: Patient was able tolerate progression of ROM and strengthening program today. He reports feeling increased challenge and felt more weak in L UE with upper body ergometer and strengthening exercises. We will continue to progress program towards established goals as able.    OBJECTIVE IMPAIRMENTS: decreased activity tolerance, decreased endurance, decreased mobility, decreased ROM, decreased strength, hypomobility, impaired UE functional use, postural dysfunction, and pain.   ACTIVITY LIMITATIONS: carrying, lifting, sleeping, bathing, dressing, reach over head, and hygiene/grooming  PARTICIPATION LIMITATIONS: meal prep, cleaning, laundry, driving, community activity, and occupation  PERSONAL FACTORS: Time since onset of injury/illness/exacerbation and 1 comorbidity: DM are also affecting patient's functional outcome.   REHAB POTENTIAL: Good  CLINICAL DECISION MAKING: Stable/uncomplicated  EVALUATION COMPLEXITY: Low   GOALS: Goals reviewed with patient? No given time constraints   SHORT TERM GOALS: Target date: 05/15/2022 Pt will demonstrate appropriate understanding and performance of initially prescribed HEP in order to facilitate improved independence with management of symptoms.  Baseline: HEP provided on eval Goal status: INITIAL   2. Pt will score greater than or equal to 56 on FOTO in order to demonstrate improved perception of function due to symptoms.  Baseline: 46  Goal status: INITIAL     LONG TERM GOALS: Target date: 06/05/2022 Pt will score 67 on FOTO in order to demonstrate improved perception of function due to symptoms. Baseline: 46 Goal status: INITIAL  2.  Pt will demonstrate at least 150 degrees of R active shoulder elevation in order to demonstrate improved tolerance to functional movement patterns such as reaching overhead.  Baseline: see ROM chart above Goal status: INITIAL  3.  Pt will  demonstrate at least 4+/5 shoulder IR MMT BIL for improved symmetry of UE strength and improved tolerance to functional movements.  Baseline: see MMT chart above Goal status: INITIAL  4. Pt will report ability to lift up to 10# with less than 2 pt increase in pain on NPS in order to improve tolerance to household tasks such as dishes/laundry.   Baseline: avoiding lifting due to pain  Goal status: INITIAL   5. Pt will report at least 50% decrease in overall pain levels in past week in order to facilitate improved tolerance to basic ADLs/mobility.   Baseline: 0-8/10  Goal status: INITIAL    PLAN:  PT FREQUENCY: 2x/week  PT DURATION: 6 weeks  PLANNED INTERVENTIONS: Therapeutic exercises, Therapeutic activity, Neuromuscular re-education, Balance training, Gait training, Patient/Family education, Self Care, Joint mobilization, Joint manipulation, Stair training, Aquatic Therapy, Dry Needling, Electrical stimulation, Spinal  manipulation, Spinal mobilization, Cryotherapy, Moist heat, Taping, Manual therapy, and Re-evaluation  PLAN FOR NEXT SESSION: emphasis on postural endurance/stability. Gentle GH mobility as able/tolerated.    Mauri Reading, PT, DPT 05/12/2022, 3:02 PM

## 2022-05-19 ENCOUNTER — Ambulatory Visit: Payer: 59 | Admitting: Physical Therapy

## 2022-05-26 ENCOUNTER — Ambulatory Visit: Payer: 59 | Admitting: Physical Therapy

## 2022-06-07 ENCOUNTER — Ambulatory Visit: Payer: 59 | Admitting: Family Medicine

## 2022-06-12 ENCOUNTER — Encounter: Payer: Self-pay | Admitting: Family Medicine

## 2022-06-12 ENCOUNTER — Ambulatory Visit (INDEPENDENT_AMBULATORY_CARE_PROVIDER_SITE_OTHER)
Admission: RE | Admit: 2022-06-12 | Discharge: 2022-06-12 | Disposition: A | Discharge status: Home / Self Care | Payer: 59 | Source: Ambulatory Visit | Attending: Family Medicine | Admitting: Family Medicine

## 2022-06-12 ENCOUNTER — Ambulatory Visit: Payer: 59 | Admitting: Family Medicine

## 2022-06-12 VITALS — BP 126/74 | HR 78 | Temp 97.2°F | Ht 69.0 in | Wt 181.0 lb

## 2022-06-12 DIAGNOSIS — M25511 Pain in right shoulder: Secondary | ICD-10-CM | POA: Diagnosis not present

## 2022-06-12 DIAGNOSIS — E1169 Type 2 diabetes mellitus with other specified complication: Secondary | ICD-10-CM | POA: Diagnosis not present

## 2022-06-12 DIAGNOSIS — G8929 Other chronic pain: Secondary | ICD-10-CM

## 2022-06-12 DIAGNOSIS — Z7984 Long term (current) use of oral hypoglycemic drugs: Secondary | ICD-10-CM | POA: Diagnosis not present

## 2022-06-12 LAB — POCT GLYCOSYLATED HEMOGLOBIN (HGB A1C): Hemoglobin A1C: 7.1 % — AB (ref 4.0–5.6)

## 2022-06-12 MED ORDER — DAPAGLIFLOZIN PROPANEDIOL 10 MG PO TABS: 10.0000 mg | ORAL_TABLET | Freq: Every day | ORAL | 1 refills | Status: DC

## 2022-06-12 NOTE — Progress Notes (Signed)
Ph: [phone] Fax: [phone]   Patient ID: Gary Long, male    DOB: 02/13/1970, 52 y.o.   MRN: [id number]  This visit was conducted in person.  BP 126/74   Pulse 78   Temp (!) 97.2 F (36.2 C) (Temporal)   Ht 5\' 9"  (1.753 m)   Wt 181 lb (82.1 kg)   SpO2 97%   BMI 26.73 kg/m    CC: 6 mo dm f/u visit  Subjective:   HPI: Gary Long is a 52 y.o. male presenting on 06/12/2022 for Diabetes (Home readings have been 120-140 fasting ) and Shoulder Pain (Right shoulder pain pt not helping )   Ongoing R shoulder discomfort for 6+ months without inciting trauma/injury. Saw Dr Patsy Lager sports medicine 03/2022 with dx RTC tendonitis with impingement, started on home exercises followed by PT course - has had 2 sessions, last 05/12/2022, without significant improvement. He even received subacromial steroid injection back in December 2023 without improvement. Taking ibuprofen, robaxin without relief. Continues with anterior shoulder joint pain, worse with lifting objects past 90 degrees.   DM - does regularly check sugars 128-148 fasting. Compliant with antihyperglycemic regimen which includes: jardiance 10mg  daily, metformin 500mg  bid. No recent UTI or yeast infection. Denies low sugars or hypoglycemic symptoms. Denies paresthesias, blurry vision. Occ blurry vision. Last diabetic eye exam 08/2020 - due - will schedule through our office. Glucometer brand: accuchek Guide. Last foot exam: 05/2021 - rpt today. DSME: had first class 2019.  Lab Results  Component Value Date   HGBA1C 7.1 (A) 06/12/2022   Diabetic Foot Exam - Simple   Simple Foot Form Diabetic Foot exam was performed with the following findings: Yes 06/12/2022  8:59 AM  Visual Inspection No deformities, no ulcerations, no other skin breakdown bilaterally: Yes Sensation Testing Intact to touch and monofilament testing bilaterally: Yes Pulse Check Posterior Tibialis and Dorsalis pulse intact bilaterally:  Yes Comments No claudication     Lab Results  Component Value Date   MICROALBUR <0.7 11/28/2021         Relevant past medical, surgical, family and social history reviewed and updated as indicated. Interim medical history since our last visit reviewed. Allergies and medications reviewed and updated. Outpatient Medications Prior to Visit  Medication Sig Dispense Refill   Accu-Chek FastClix Lancets MISC USE AS DIRECTED TO CHECK BLOOD SUGAR ONCE DAILY 100 each 0   atorvastatin (LIPITOR) 40 MG tablet Take 1 tablet (40 mg total) by mouth daily. 90 tablet 3   Blood Glucose Monitoring Suppl (ACCU-CHEK GUIDE) w/Device KIT 1 each by Does not apply route as needed. Use as directed to check blood sugar once daily 1 kit 0   Cholecalciferol (VITAMIN D3) 50 MCG (2000 UT) capsule Take 1 capsule (2,000 Units total) by mouth daily.     diclofenac Sodium (VOLTAREN) 1 % GEL Apply 2 g topically 3 (three) times daily as needed (to affected joint). 100 g 1   famotidine (PEPCID) 20 MG tablet Take 1 tablet (20 mg total) by mouth 2 (two) times daily.     glucose blood (ACCU-CHEK GUIDE) test strip USE AS DIRECTED TO CHECK BLOOD SUGAR ONCE DAILY 100 strip 1   hydrOXYzine (ATARAX/VISTARIL) 25 MG tablet TAKE 1/2 TO 1 TABLET BY MOUTH 3 TIMES A DAY AS NEEDED FOR ITCHING (SEDATION PRECAUTIONS) 30 tablet 0   ibuprofen (ADVIL) 800 MG tablet TAKE 1 TABLET (800 MG TOTAL) BY MOUTH 3 (THREE) TIMES DAILY AS NEEDED FOR MODERATE PAIN. 90 tablet  0   metFORMIN (GLUCOPHAGE) 500 MG tablet Take 1 tablet (500 mg total) by mouth 2 (two) times daily with a meal. 180 tablet 3   methocarbamol (ROBAXIN) 500 MG tablet Take 1 tablet (500 mg total) by mouth 3 (three) times daily as needed for muscle spasms (sedation precautions). 40 tablet 0   empagliflozin (JARDIANCE) 10 MG TABS tablet Take 1 tablet (10 mg total) by mouth daily before breakfast. 30 tablet 11   ondansetron (ZOFRAN-ODT) 4 MG disintegrating tablet Take 1 tablet (4 mg total) by  mouth every 6 (six) hours as needed for nausea or vomiting. (Patient not taking: Reported on 06/12/2022) 15 tablet 0   pantoprazole (PROTONIX) 40 MG tablet Take 1 tablet (40 mg total) by mouth daily as needed (heartburn). 30 tablet 3   doxycycline (VIBRA-TABS) 100 MG tablet Take 1 tablet (100 mg total) by mouth daily. 45 tablet 0   No facility-administered medications prior to visit.     Per HPI unless specifically indicated in ROS section below Review of Systems  Objective:  BP 126/74   Pulse 78   Temp (!) 97.2 F (36.2 C) (Temporal)   Ht 5\' 9"  (1.753 m)   Wt 181 lb (82.1 kg)   SpO2 97%   BMI 26.73 kg/m   Wt Readings from Last 3 Encounters:  06/12/22 181 lb (82.1 kg)  03/24/22 182 lb (82.6 kg)  03/16/22 186 lb 8 oz (84.6 kg)      Physical Exam Vitals and nursing note reviewed.  Constitutional:      Appearance: Normal appearance. He is not ill-appearing.  Eyes:     Extraocular Movements: Extraocular movements intact.     Conjunctiva/sclera: Conjunctivae normal.     Pupils: Pupils are equal, round, and reactive to light.  Cardiovascular:     Rate and Rhythm: Normal rate and regular rhythm.     Pulses: Normal pulses.     Heart sounds: Normal heart sounds. No murmur heard. Pulmonary:     Effort: Pulmonary effort is normal. No respiratory distress.     Breath sounds: Normal breath sounds. No wheezing, rhonchi or rales.  Musculoskeletal:     Right lower leg: No edema.     Left lower leg: No edema.     Comments:  See HPI for foot exam if done L shoulder WNL R shoulder exam: No deformity of shoulders on inspection. No significant pain with palpation of shoulder landmarks. Limited ROM past 90 degrees in abduction and forward flexion due to discomfort, improved passive ROM. + pain with testing SITS in ext rotation. No pain with empty can sign. Neg Speed test. Painful with impingement testing. Mild pain with crossover test. No pain with rotation of humeral head in GH  joint.   Skin:    General: Skin is warm and dry.     Findings: No rash.  Neurological:     Mental Status: He is alert.  Psychiatric:        Mood and Affect: Mood normal.        Behavior: Behavior normal.       Results for orders placed or performed in visit on 06/12/22  POCT glycosylated hemoglobin (Hb A1C)  Result Value Ref Range   Hemoglobin A1C 7.1 (A) 4.0 - 5.6 %   HbA1c POC (<> result, manual entry)     HbA1c, POC (prediabetic range)     HbA1c, POC (controlled diabetic range)     Lab Results  Component Value Date   CREATININE 1.08  03/12/2022   BUN 19 03/12/2022   NA 139 03/12/2022   K 3.9 03/12/2022   CL 105 03/12/2022   CO2 22 03/12/2022   Assessment & Plan:   Problem List Items Addressed This Visit     Type 2 diabetes mellitus with other specified complication (HCC) - Primary    Chronic, stable on current regimen, but A1c above goal. Change jardiance 10mg  to [place] 10mg  daily Continue metformin.  Foot exam today.       Relevant Medications   dapagliflozin propanediol (FARXIGA) 10 MG TABS tablet   Other Relevant Orders   POCT glycosylated hemoglobin (Hb A1C) (Completed)   Chronic right shoulder pain    Chronic R shoulder pain present since prior to 12/2021.  Failed PT, NSAID, steroid injection.  Check R shoulder xray today and refer to ortho for further evaluation Pt agrees with plan.       Relevant Orders   DG Shoulder Right   Ambulatory referral to Orthopedic Surgery     Meds ordered this encounter  Medications   dapagliflozin propanediol (FARXIGA) 10 MG TABS tablet    Sig: Take 1 tablet (10 mg total) by mouth daily.    Dispense:  90 tablet    Refill:  1    To replace Jardiance    Orders Placed This Encounter  Procedures   DG Shoulder Right    Standing Status:   Future    Standing Expiration Date:   12/12/2022    Order Specific Question:   Reason for Exam (SYMPTOM  OR DIAGNOSIS REQUIRED)    Answer:   chronic R anterior shoulder pain h/o  RTC injury    Order Specific Question:   Preferred imaging location?    Answer:   [facility]   Ambulatory referral to Orthopedic Surgery    Referral Priority:   Routine    Referral Type:   Surgical    Referral Reason:   Specialty Services Required    Requested Specialty:   Orthopedic Surgery    Number of Visits Requested:   1   POCT glycosylated hemoglobin (Hb A1C)    Patient Instructions  We will sign you up for diabetic retinopathy screen at our office.  We will change Jardiance to [place] 10mg  daily.  Continue metformin 500mg  twice dialy.  Right shoulder xray today - we will refer you to orthopedist for ongoing right shoulder pain likely related to rotator cuff injury/tendonitis.  Return in 6 months for physical.  Follow up plan: Return in about 6 months (around 12/12/2022) for annual exam, prior fasting for blood work.  Eustaquio Boyden, MD

## 2022-06-12 NOTE — Patient Instructions (Addendum)
We will sign you up for diabetic retinopathy screen at our office.  We will change Jardiance to [place] 10mg  daily.  Continue metformin 500mg  twice dialy.  Right shoulder xray today - we will refer you to orthopedist for ongoing right shoulder pain likely related to rotator cuff injury/tendonitis.  Return in 6 months for physical.

## 2022-06-12 NOTE — Assessment & Plan Note (Addendum)
Chronic, stable on current regimen, but A1c above goal. Change jardiance 10mg  to [place] 10mg  daily Continue metformin.  Foot exam today.

## 2022-06-12 NOTE — Assessment & Plan Note (Signed)
Chronic R shoulder pain present since prior to 12/2021.  Failed PT, NSAID, steroid injection.  Check R shoulder xray today and refer to ortho for further evaluation Pt agrees with plan.

## 2022-06-27 ENCOUNTER — Ambulatory Visit: Payer: 59 | Admitting: Orthopaedic Surgery

## 2022-07-05 ENCOUNTER — Ambulatory Visit: Payer: 59 | Admitting: Orthopaedic Surgery

## 2022-07-06 ENCOUNTER — Telehealth: Payer: Self-pay | Admitting: Family Medicine

## 2022-07-06 NOTE — Telephone Encounter (Signed)
Patient came by office and needs a letter that states when he had his annual physical. He stated that he needs a letter like the one form 12/22/2019. Thank you!

## 2022-07-07 NOTE — Telephone Encounter (Signed)
Letter written and in LIsa's box.

## 2022-07-10 NOTE — Telephone Encounter (Signed)
Spoke with pt notifying him the letter is ready to pick up. Pt expresses his thanks.   [Placed letter at front office.]

## 2022-07-12 ENCOUNTER — Telehealth: Payer: Self-pay | Admitting: Family Medicine

## 2022-07-12 ENCOUNTER — Encounter: Payer: Self-pay | Admitting: Family Medicine

## 2022-07-12 ENCOUNTER — Ambulatory Visit (INDEPENDENT_AMBULATORY_CARE_PROVIDER_SITE_OTHER): Payer: Self-pay | Admitting: Family Medicine

## 2022-07-12 VITALS — BP 120/84 | HR 65 | Temp 97.4°F | Ht 69.0 in | Wt 181.2 lb

## 2022-07-12 DIAGNOSIS — Z7984 Long term (current) use of oral hypoglycemic drugs: Secondary | ICD-10-CM | POA: Diagnosis not present

## 2022-07-12 DIAGNOSIS — E1169 Type 2 diabetes mellitus with other specified complication: Secondary | ICD-10-CM

## 2022-07-12 DIAGNOSIS — M542 Cervicalgia: Secondary | ICD-10-CM | POA: Diagnosis not present

## 2022-07-12 MED ORDER — PREDNISONE 20 MG PO TABS: ORAL_TABLET | ORAL | 0 refills | Status: DC

## 2022-07-12 NOTE — Assessment & Plan Note (Signed)
Anticipate left neck spasm/tightness contributing to cervicogenic headache. Rx prednisone taper, continue muscle relaxant, once completed with prednisone may return to ibuprofen. Discussed heating pad, gentle stretching (exercises provided). If no better, consider PM&R referral. Pt agrees with plan.

## 2022-07-12 NOTE — Telephone Encounter (Signed)
Patient ca,me by and dropped off a form that nds to be filled out. Placed in Dr. Reece Agar box up front. Thank you!

## 2022-07-12 NOTE — Telephone Encounter (Signed)
Placed form in Dr. G's box.  

## 2022-07-12 NOTE — Patient Instructions (Signed)
You likely have neck spasm/strain causing pain and/or occipital neuralgia contributing. Treat with prednisone taper sent to pharmacy, continue flexeril or robaxin muscle relaxant, heating pad. Once done with prednisone, may return to ibuprofen anti inflammatory.  Do gentle stretching of neck - handout provided today.  If not better with above, let us know for referral to physiatrist.

## 2022-07-12 NOTE — Progress Notes (Addendum)
Ph: [phone] Fax: [phone]   Patient ID: Gary Long, male    DOB: February 07, 1970, 52 y.o.   MRN: [id number]  This visit was conducted in person.  BP 120/84   Pulse 65   Temp (!) 97.4 F (36.3 C) (Temporal)   Ht 5\' 9"  (1.753 m)   Wt 181 lb 4 oz (82.2 kg)   SpO2 98%   BMI 26.77 kg/m    CC: neck pain  Subjective:   HPI: Gary Long is a 52 y.o. male presenting on 07/12/2022 for Neck Pain (C/o neck pain radiating into head. Sxs started 07/08/22.)   3d h/o left neck pain radiation into left temporal head. Denies inciting trauma/injury or falls. At its worst 10/10 2d ago, currently 7/10 pain. Notes bilat shoulder pain but no shooting pain down arms.  Has tried ibuprofen 800mg  and flexeril 10mg  with some benefit.  Tried ice and heating pad without benefit.  No fevers/chills, ear or tooth pain, ST, cough.   Sleeps on back or side. Has tried different pillows without benefit.      Relevant past medical, surgical, family and social history reviewed and updated as indicated. Interim medical history since our last visit reviewed. Allergies and medications reviewed and updated. Outpatient Medications Prior to Visit  Medication Sig Dispense Refill   Accu-Chek FastClix Lancets MISC USE AS DIRECTED TO CHECK BLOOD SUGAR ONCE DAILY 100 each 0   atorvastatin (LIPITOR) 40 MG tablet Take 1 tablet (40 mg total) by mouth daily. 90 tablet 3   Blood Glucose Monitoring Suppl (ACCU-CHEK GUIDE) w/Device KIT 1 each by Does not apply route as needed. Use as directed to check blood sugar once daily 1 kit 0   Cholecalciferol (VITAMIN D3) 50 MCG (2000 UT) capsule Take 1 capsule (2,000 Units total) by mouth daily.     dapagliflozin propanediol (FARXIGA) 10 MG TABS tablet Take 1 tablet (10 mg total) by mouth daily. 90 tablet 1   diclofenac Sodium (VOLTAREN) 1 % GEL Apply 2 g topically 3 (three) times daily as needed (to affected joint). 100 g 1   famotidine (PEPCID) 20 MG tablet Take 1  tablet (20 mg total) by mouth 2 (two) times daily.     glucose blood (ACCU-CHEK GUIDE) test strip USE AS DIRECTED TO CHECK BLOOD SUGAR ONCE DAILY 100 strip 1   hydrOXYzine (ATARAX/VISTARIL) 25 MG tablet TAKE 1/2 TO 1 TABLET BY MOUTH 3 TIMES A DAY AS NEEDED FOR ITCHING (SEDATION PRECAUTIONS) 30 tablet 0   ibuprofen (ADVIL) 800 MG tablet TAKE 1 TABLET (800 MG TOTAL) BY MOUTH 3 (THREE) TIMES DAILY AS NEEDED FOR MODERATE PAIN. 90 tablet 0   metFORMIN (GLUCOPHAGE) 500 MG tablet Take 1 tablet (500 mg total) by mouth 2 (two) times daily with a meal. 180 tablet 3   methocarbamol (ROBAXIN) 500 MG tablet Take 1 tablet (500 mg total) by mouth 3 (three) times daily as needed for muscle spasms (sedation precautions). 40 tablet 0   ondansetron (ZOFRAN-ODT) 4 MG disintegrating tablet Take 1 tablet (4 mg total) by mouth every 6 (six) hours as needed for nausea or vomiting. 15 tablet 0   pantoprazole (PROTONIX) 40 MG tablet Take 1 tablet (40 mg total) by mouth daily as needed (heartburn). 30 tablet 3   No facility-administered medications prior to visit.     Per HPI unless specifically indicated in ROS section below Review of Systems  Objective:  BP 120/84   Pulse 65   Temp (!) 97.4 F (  36.3 C) (Temporal)   Ht 5\' 9"  (1.753 m)   Wt 181 lb 4 oz (82.2 kg)   SpO2 98%   BMI 26.77 kg/m   Wt Readings from Last 3 Encounters:  07/12/22 181 lb 4 oz (82.2 kg)  06/12/22 181 lb (82.1 kg)  03/24/22 182 lb (82.6 kg)      Physical Exam Vitals and nursing note reviewed.  Constitutional:      Appearance: Normal appearance. He is not ill-appearing.  HENT:     Head: Normocephalic and atraumatic.     Right Ear: Tympanic membrane, ear canal and external ear normal. There is no impacted cerumen.     Left Ear: Tympanic membrane, ear canal and external ear normal. There is no impacted cerumen.     Nose: Nose normal.     Mouth/Throat:     Mouth: Mucous membranes are moist.     Pharynx: Oropharynx is clear. No  oropharyngeal exudate or posterior oropharyngeal erythema.  Eyes:     General:        Right eye: No discharge.        Left eye: No discharge.     Extraocular Movements: Extraocular movements intact.     Conjunctiva/sclera: Conjunctivae normal.     Pupils: Pupils are equal, round, and reactive to light.  Neck:      Comments:  No midline cervical spine tenderness  + left paracervical mm tightness/tenderness present FROM flexion/extension of neck  Limited ROM cervical neck with right lateral flexion and lateral rotation  due to discomfort Tender to palpation at left occiput  Musculoskeletal:        General: Tenderness present. Normal range of motion.     Cervical back: Normal range of motion and neck supple. No rigidity.  Lymphadenopathy:     Cervical: No cervical adenopathy.  Neurological:     General: No focal deficit present.     Mental Status: He is alert.     Comments:  CN 2-12 intact FTN intact EOMI  Psychiatric:        Mood and Affect: Mood normal.        Behavior: Behavior normal.       Results for orders placed or performed in visit on 06/12/22  POCT glycosylated hemoglobin (Hb A1C)  Result Value Ref Range   Hemoglobin A1C 7.1 (A) 4.0 - 5.6 %   HbA1c POC (<> result, manual entry)     HbA1c, POC (prediabetic range)     HbA1c, POC (controlled diabetic range)      Assessment & Plan:   Problem List Items Addressed This Visit     Type 2 diabetes mellitus with other specified complication (HCC)    Chronic, stable on metformin and farxiga. Reviewed steroid hyperglycemia precautions      Neck pain on left side - Primary    Anticipate left neck spasm/tightness contributing to cervicogenic headache. Rx prednisone taper, continue muscle relaxant, once completed with prednisone may return to ibuprofen. Discussed heating pad, gentle stretching (exercises provided). If no better, consider PM&R referral. Pt agrees with plan.         Meds ordered this encounter   Medications   predniSONE (DELTASONE) 20 MG tablet    Sig: Take two tablets daily for 3 days followed by one tablet daily for 4 days    Dispense:  10 tablet    Refill:  0    No orders of the defined types were placed in this encounter.   Patient Instructions  You likely have neck spasm/strain causing pain and/or occipital neuralgia contributing. Treat with prednisone taper sent to pharmacy, continue flexeril or robaxin muscle relaxant, heating pad. Once done with prednisone, may return to ibuprofen anti inflammatory.  Do gentle stretching of neck - handout provided today.  If not better with above, let us know for referral to physiatrist.   Follow up plan: Return if symptoms worsen or fail to improve.  Eustaquio Boyden, MD

## 2022-07-12 NOTE — Assessment & Plan Note (Addendum)
Chronic, stable on metformin and farxiga. Reviewed steroid hyperglycemia precautions

## 2022-07-14 NOTE — Telephone Encounter (Signed)
Pt called returning Lisa's call. Told pt Lisa's response. Pt requested another call back to discuss more issues. Pt would give details. Call back # [phone]

## 2022-07-14 NOTE — Telephone Encounter (Addendum)
Recently seen for L neck pain. Due to this I would recommend work restrictions - no heavy lifting, pulling, pushing >20 lbs for 3-4 wks. Just wanted to let him know these restrictions would be included in physical clearance form.

## 2022-07-14 NOTE — Telephone Encounter (Signed)
Lvm returning pt's call.   

## 2022-07-14 NOTE — Telephone Encounter (Signed)
Spoke with pt relaying Dr. Timoteo Expose message. Pt verbalizes understanding. Says the prednisone really helped and that his plan now is to return to work in 07/31/22.

## 2022-07-14 NOTE — Telephone Encounter (Addendum)
Form filled and in Lisa's box.  

## 2022-07-14 NOTE — Telephone Encounter (Signed)
Lvm asking pt to call back. Need to notify him the form is ready to pick up.   [Placed form at front office. Made copy to scan.]

## 2022-07-17 NOTE — Telephone Encounter (Signed)
Lvm returning pt's call.   

## 2022-07-18 NOTE — Telephone Encounter (Signed)
Lvm returning pt's call. Need to know what issues pt is having.

## 2022-07-18 NOTE — Telephone Encounter (Signed)
Filled and in Lisa's box.  

## 2022-07-18 NOTE — Telephone Encounter (Signed)
Placed updated form at front office. Made copy to be scanned.

## 2022-07-18 NOTE — Telephone Encounter (Signed)
Received new form to complete. Placed in Dr. Timoteo Expose box.

## 2022-07-18 NOTE — Telephone Encounter (Signed)
[employer],patient employer received paperwork with restriction for patient up until July 22,2024. They called in today asking will he be okay without restriction after this time,or will he have to come in to be reevaluated before this time to get documentation stating that he is off of the restrictions? Marland Kitchen

## 2022-07-18 NOTE — Telephone Encounter (Addendum)
Ok to return to work without restrictions 07/31/2022 without need to return to office unless he's having more difficulty.

## 2022-07-18 NOTE — Telephone Encounter (Signed)
Spoke with Casimiro Needle, of [employer], relaying Dr. Timoteo Expose message. He verbalizes understanding and states that info will need to be documented on pt's form under the "They may work" section, the 1st option- "without any restrictions or". Per Casimiro Needle, Dr. Reece Agar will need to include original restrictions, then add pt may return to work on 07/31/22 without any restrictions and does not need a follow up office visit. Casimiro Needle is faxing a new form to complete.

## 2022-07-21 ENCOUNTER — Telehealth: Payer: Self-pay | Admitting: Family Medicine

## 2022-07-21 NOTE — Telephone Encounter (Signed)
Pt came in office requesting PCP fill out Physical Clearance form . It was place in PCP folder

## 2022-07-21 NOTE — Telephone Encounter (Addendum)
Lvm asking pt to call back. Need clarification of form and exactly what [employer] needs for to say, since Dr. Reece Agar has completed this form twice already.   [Form is in basket on [employer] [address].]

## 2022-07-24 NOTE — Telephone Encounter (Signed)
 Filled and in Lisa's box 

## 2022-07-24 NOTE — Telephone Encounter (Signed)
 Noted  

## 2022-07-24 NOTE — Telephone Encounter (Signed)
Pt came into office today to pick up form. Says we need to check the first option (without any restrictions) because he is ready to go back to work. Requests a call when form is ready.   Placed form in Dr. Timoteo Expose box.

## 2022-07-24 NOTE — Telephone Encounter (Signed)
Pt called back returning Lisa's call. Told pt Lisa's response. Pt wants to thank Misty Stanley for ppw! Call back # [phone]

## 2022-07-24 NOTE — Telephone Encounter (Signed)
Lvm asking pt to call back. Need to notify him the form is ready to pick up.   [Placed form at front office.]

## 2022-08-10 DIAGNOSIS — Z419 Encounter for procedure for purposes other than remedying health state, unspecified: Secondary | ICD-10-CM | POA: Diagnosis not present

## 2022-09-07 ENCOUNTER — Other Ambulatory Visit: Payer: Self-pay | Admitting: Family Medicine

## 2022-09-07 NOTE — Telephone Encounter (Signed)
Received a refill request for Gary Long that noted the patient needed a PA.

## 2022-09-08 ENCOUNTER — Telehealth: Payer: Self-pay

## 2022-09-08 ENCOUNTER — Other Ambulatory Visit (HOSPITAL_COMMUNITY): Payer: Self-pay

## 2022-09-08 NOTE — Telephone Encounter (Signed)
Pharmacy Patient Advocate Encounter   Received notification from RX Request Messages that prior authorization for Farxiga 10MG  tablets is required/requested.   Insurance verification completed.   The patient is insured through [facility] .   Per test claim: PA required; PA submitted to [employer] via CoverMyMeds Key/confirmation #/EOC [id number] Status is pending

## 2022-09-08 NOTE — Telephone Encounter (Signed)
Notified pharmacy of PA status

## 2022-09-08 NOTE — Telephone Encounter (Signed)
 PA has been submitted and documented in separate encounter, please sign off on rx in this encounter as PA team is unable to resolve RX requests. Thank you

## 2022-09-10 DIAGNOSIS — Z419 Encounter for procedure for purposes other than remedying health state, unspecified: Secondary | ICD-10-CM | POA: Diagnosis not present

## 2022-09-13 NOTE — Telephone Encounter (Signed)
Pharmacy Patient Advocate Encounter  Received notification from  [employer]  that Prior Authorization for Marcelline Deist has been APPROVED from 09/09/22 to 09/09/23   PA #/Case ID/Reference #: [id number]

## 2022-09-14 NOTE — Telephone Encounter (Signed)
Spoke with [facility] notifying them of PA approval. They will put through and fill for pt.

## 2022-10-10 DIAGNOSIS — Z419 Encounter for procedure for purposes other than remedying health state, unspecified: Secondary | ICD-10-CM | POA: Diagnosis not present

## 2022-10-22 ENCOUNTER — Other Ambulatory Visit: Payer: Self-pay | Admitting: Family Medicine

## 2022-10-24 ENCOUNTER — Telehealth: Payer: Self-pay

## 2022-10-24 ENCOUNTER — Encounter: Payer: Self-pay | Admitting: Nurse Practitioner

## 2022-10-24 ENCOUNTER — Ambulatory Visit (INDEPENDENT_AMBULATORY_CARE_PROVIDER_SITE_OTHER): Payer: 59 | Admitting: Nurse Practitioner

## 2022-10-24 VITALS — BP 120/86 | HR 83 | Temp 100.5°F | Ht 69.0 in | Wt 183.2 lb

## 2022-10-24 DIAGNOSIS — E1169 Type 2 diabetes mellitus with other specified complication: Secondary | ICD-10-CM

## 2022-10-24 DIAGNOSIS — J069 Acute upper respiratory infection, unspecified: Secondary | ICD-10-CM | POA: Diagnosis not present

## 2022-10-24 LAB — POCT INFLUENZA A/B
Influenza A, POC: POSITIVE — AB
Influenza B, POC: NEGATIVE

## 2022-10-24 LAB — POC COVID19 BINAXNOW: SARS Coronavirus 2 Ag: NEGATIVE

## 2022-10-24 LAB — GLUCOSE, POCT (MANUAL RESULT ENTRY): POC Glucose: 162 mg/dL — AB (ref 70–99)

## 2022-10-24 MED ORDER — BENZONATATE 200 MG PO CAPS: 200.0000 mg | ORAL_CAPSULE | Freq: Three times a day (TID) | ORAL | 0 refills | Status: DC | PRN

## 2022-10-24 MED ORDER — OSELTAMIVIR PHOSPHATE 75 MG PO CAPS: 75.0000 mg | ORAL_CAPSULE | Freq: Two times a day (BID) | ORAL | 0 refills | Status: DC

## 2022-10-24 NOTE — Patient Instructions (Addendum)
URI Instructions: Avoid decongestants if you have high blood pressure. Encourage adequate oral hydration. Use over-the-counter  cold" medicine  such as Dayquil/nyquil for cough and congestion.  Alternate benzonatate with mucinex DM or Robitussin  or delsym for cough  Alternate "Tylenol" 500mg   with Ibuprofen 400mg   every 6hrs for fever, chills and achyness. Take meds with food Use cool mist humidifier at bedtime to help with nasal congestion and cough.  Cold/cough medications may have tylenol or ibuprofen or guaifenesin or dextromethophan in them, so be careful not to take beyond the recommended dose for each of these medications.  Go to ED if symptoms worsen

## 2022-10-24 NOTE — Progress Notes (Signed)
Acute Office Visit  Subjective:    Patient ID: Gary Long, male    DOB: 23-Jul-1970, 52 y.o.   MRN: [id number]  Chief Complaint  Patient presents with   URI   URI  This is a new problem. The current episode started in the past 7 days. The problem has been gradually worsening. The maximum temperature recorded prior to his arrival was 102 - 102.9 F. The fever has been present for 1 to 2 days. Associated symptoms include congestion, coughing, headaches, joint pain, rhinorrhea, sinus pain and sneezing. Pertinent negatives include no abdominal pain, chest pain, diarrhea, dysuria, ear pain, joint swelling, nausea, neck pain, plugged ear sensation, rash, sore throat, swollen glands, vomiting or wheezing. He has tried acetaminophen for the symptoms.  Accompanied by daughter today.  Outpatient Medications Prior to Visit  Medication Sig   Accu-Chek FastClix Lancets MISC USE AS DIRECTED TO CHECK BLOOD SUGAR ONCE DAILY   atorvastatin (LIPITOR) 40 MG tablet Take 1 tablet (40 mg total) by mouth daily.   Blood Glucose Monitoring Suppl (ACCU-CHEK GUIDE) w/Device KIT 1 each by Does not apply route as needed. Use as directed to check blood sugar once daily   Cholecalciferol (VITAMIN D3) 50 MCG (2000 UT) capsule Take 1 capsule (2,000 Units total) by mouth daily.   diclofenac Sodium (VOLTAREN) 1 % GEL Apply 2 g topically 3 (three) times daily as needed (to affected joint).   famotidine (PEPCID) 20 MG tablet Take 1 tablet (20 mg total) by mouth 2 (two) times daily.   FARXIGA 10 MG TABS tablet TAKE 1 TABLET BY MOUTH EVERY DAY   glucose blood (ACCU-CHEK GUIDE) test strip USE AS DIRECTED TO CHECK BLOOD SUGAR ONCE DAILY   hydrOXYzine (ATARAX/VISTARIL) 25 MG tablet TAKE 1/2 TO 1 TABLET BY MOUTH 3 TIMES A DAY AS NEEDED FOR ITCHING (SEDATION PRECAUTIONS)   ibuprofen (ADVIL) 800 MG tablet TAKE 1 TABLET (800 MG TOTAL) BY MOUTH 3 (THREE) TIMES DAILY AS NEEDED FOR MODERATE PAIN.   metFORMIN (GLUCOPHAGE) 500 MG  tablet Take 1 tablet (500 mg total) by mouth 2 (two) times daily with a meal.   methocarbamol (ROBAXIN) 500 MG tablet Take 1 tablet (500 mg total) by mouth 3 (three) times daily as needed for muscle spasms (sedation precautions).   ondansetron (ZOFRAN-ODT) 4 MG disintegrating tablet Take 1 tablet (4 mg total) by mouth every 6 (six) hours as needed for nausea or vomiting.   pantoprazole (PROTONIX) 40 MG tablet Take 1 tablet (40 mg total) by mouth daily as needed (heartburn).   [DISCONTINUED] predniSONE (DELTASONE) 20 MG tablet Take two tablets daily for 3 days followed by one tablet daily for 4 days   No facility-administered medications prior to visit.    Reviewed past medical and social history.  Review of Systems  HENT:  Positive for congestion, rhinorrhea, sinus pain and sneezing. Negative for ear pain and sore throat.   Respiratory:  Positive for cough. Negative for wheezing.   Cardiovascular:  Negative for chest pain.  Gastrointestinal:  Negative for abdominal pain, diarrhea, nausea and vomiting.  Genitourinary:  Negative for dysuria.  Musculoskeletal:  Positive for joint pain. Negative for neck pain.  Skin:  Negative for rash.  Neurological:  Positive for headaches.   Per HPI     Objective:    Physical Exam Vitals and nursing note reviewed.  Constitutional:      General: He is not in acute distress.    Appearance: He is ill-appearing.  HENT:  Right Ear: Tympanic membrane, ear canal and external ear normal.     Left Ear: Tympanic membrane, ear canal and external ear normal.     Nose: No nasal tenderness or mucosal edema.     Right Nostril: No occlusion.     Left Nostril: No occlusion.     Right Turbinates: Not enlarged, swollen or pale.     Left Turbinates: Not enlarged, swollen or pale.     Right Sinus: No maxillary sinus tenderness or frontal sinus tenderness.     Left Sinus: No maxillary sinus tenderness or frontal sinus tenderness.     Mouth/Throat:     Pharynx:  Uvula midline.     Tonsils: No tonsillar exudate or tonsillar abscesses.  Eyes:     Extraocular Movements: Extraocular movements intact.     Conjunctiva/sclera: Conjunctivae normal.  Cardiovascular:     Rate and Rhythm: Normal rate and regular rhythm.     Pulses: Normal pulses.     Heart sounds: Normal heart sounds.  Pulmonary:     Effort: Pulmonary effort is normal.     Breath sounds: Normal breath sounds.  Musculoskeletal:     Cervical back: Normal range of motion and neck supple.  Lymphadenopathy:     Cervical: No cervical adenopathy.  Neurological:     Mental Status: He is alert and oriented to person, place, and time.    BP 120/86   Pulse 83   Temp (!) 100.5 F (38.1 C) (Oral)   Ht 5\' 9"  (1.753 m)   Wt 183 lb 3.2 oz (83.1 kg)   SpO2 98%   BMI 27.05 kg/m    Results for orders placed or performed in visit on 10/24/22  POCT Influenza A/B  Result Value Ref Range   Influenza A, POC Positive (A) Negative   Influenza B, POC Negative Negative  POC COVID-19  Result Value Ref Range   SARS Coronavirus 2 Ag Negative Negative  POCT Glucose (CBG)  Result Value Ref Range   POC Glucose 162 (A) 70 - 99 mg/dl       Assessment & Plan:   Problem List Items Addressed This Visit     Type 2 diabetes mellitus with other specified complication (HCC)   Relevant Orders   POCT Glucose (CBG) (Completed)   Other Visit Diagnoses     Viral upper respiratory tract infection    -  Primary   Relevant Medications   oseltamivir (TAMIFLU) 75 MG capsule   benzonatate (TESSALON) 200 MG capsule   Other Relevant Orders   POCT Influenza A/B (Completed)   POC COVID-19 (Completed)      Avoid decongestants if you have high blood pressure. Encourage adequate oral hydration. Use over-the-counter  cold" medicine  such as Dayquil/nyquil for cough and congestion.  Alternate benzonatate with mucinex DM or Robitussin  or delsym for cough  Alternate "Tylenol" 500mg   with Ibuprofen 400mg   every  6hrs for fever, chills and achyness. Take meds with food Use cool mist humidifier at bedtime to help with nasal congestion and cough. Advised to go to ED if symptoms worsen or unable to maintain adequate oral hydration  Meds ordered this encounter  Medications   oseltamivir (TAMIFLU) 75 MG capsule    Sig: Take 1 capsule (75 mg total) by mouth 2 (two) times daily.    Dispense:  10 capsule    Refill:  0    Order Specific Question:   Supervising Provider    Answer:   Nadene Rubins ALFRED [[id number]]  benzonatate (TESSALON) 200 MG capsule    Sig: Take 1 capsule (200 mg total) by mouth 3 (three) times daily as needed.    Dispense:  21 capsule    Refill:  0    Order Specific Question:   Supervising Provider    Answer:   Mliss Sax [[id number]]   Return if symptoms worsen or fail to improve.    Alysia Penna, NP

## 2022-10-24 NOTE — Telephone Encounter (Signed)
Per appt notes pt already has appt scheduled with Conni Elliot NP on 10/24/22 at 9:20 for cough and cold symptoms.

## 2022-10-31 ENCOUNTER — Ambulatory Visit: Payer: Medicaid Other

## 2022-10-31 ENCOUNTER — Telehealth: Payer: Self-pay | Admitting: Family Medicine

## 2022-10-31 NOTE — Telephone Encounter (Signed)
Patient came into the office with documentation of flu shot, requested for records to be updated. Document left in Dr. Timoteo Expose folder in front office

## 2022-10-31 NOTE — Telephone Encounter (Signed)
Updated pt's chart.  

## 2022-11-10 DIAGNOSIS — Z419 Encounter for procedure for purposes other than remedying health state, unspecified: Secondary | ICD-10-CM | POA: Diagnosis not present

## 2022-11-26 ENCOUNTER — Other Ambulatory Visit: Payer: Self-pay | Admitting: Family Medicine

## 2022-11-27 ENCOUNTER — Other Ambulatory Visit: Payer: Medicaid Other

## 2022-11-28 ENCOUNTER — Encounter: Payer: Self-pay | Admitting: Family Medicine

## 2022-11-28 ENCOUNTER — Ambulatory Visit (INDEPENDENT_AMBULATORY_CARE_PROVIDER_SITE_OTHER): Payer: 59 | Admitting: Family Medicine

## 2022-11-28 VITALS — BP 132/82 | HR 75 | Temp 98.1°F | Ht 69.0 in | Wt 180.5 lb

## 2022-11-28 DIAGNOSIS — R519 Headache, unspecified: Secondary | ICD-10-CM

## 2022-11-28 DIAGNOSIS — R52 Pain, unspecified: Secondary | ICD-10-CM

## 2022-11-28 DIAGNOSIS — U071 COVID-19: Secondary | ICD-10-CM | POA: Diagnosis not present

## 2022-11-28 DIAGNOSIS — J029 Acute pharyngitis, unspecified: Secondary | ICD-10-CM | POA: Diagnosis not present

## 2022-11-28 DIAGNOSIS — J020 Streptococcal pharyngitis: Secondary | ICD-10-CM | POA: Insufficient documentation

## 2022-11-28 HISTORY — DX: COVID-19: U07.1

## 2022-11-28 LAB — POCT INFLUENZA A/B
Influenza A, POC: NEGATIVE
Influenza B, POC: NEGATIVE

## 2022-11-28 LAB — POCT RAPID STREP A (OFFICE): Rapid Strep A Screen: POSITIVE — AB

## 2022-11-28 LAB — POC COVID19 BINAXNOW: SARS Coronavirus 2 Ag: POSITIVE — AB

## 2022-11-28 MED ORDER — AMOXICILLIN 500 MG PO CAPS: 500.0000 mg | ORAL_CAPSULE | Freq: Two times a day (BID) | ORAL | 0 refills | Status: AC

## 2022-11-28 NOTE — Progress Notes (Signed)
Ph: [phone] Fax: [phone]   Patient ID: Gary Long, male    DOB: [birth year], 52 y.o.   MRN: [id number]  This visit was conducted in person.  BP 132/82   Pulse 75   Temp 98.1 F (36.7 C) (Oral)   Ht 5\' 9"  (1.753 m)   Wt 180 lb 8 oz (81.9 kg)   SpO2 98%   BMI 26.66 kg/m    CC: HA, ST, cough Subjective:   HPI: Gary Long is a 52 y.o. male presenting on 11/28/2022 for Headache (C/o HA, ST, body aches, cough and runny nose. Sxs started about 1 wk ago. Seen on 10/24/22, dx URI and flu. Treated with Tamiflu. Sxs improved, but some have returned.)   Seen last month at [facility] with dx influenza A treated with Tamiflu. Felt very bad with this but symptoms did improve after 2 weeks. Never fully better, just "80%" with residual mild headache and cough.   Now over the past 1 wk notes current symptoms - cough, HA, ST, rhinorrhea, body aches. Treating with OTC ibuprofen, tylenol, theraflu, and zyrtec. No current sick contacts at home. No smokers at home. Dry cough.   Notes months of L sided temporal pain, reproducible with palpation. 3/10 pain, 7-8 with palpation. No associated vision changes. No jaw pain with claudication. Does have neck pain worse with certain sleep positions. Denies inciting trauma/injury.   No fevers/chills, ear or tooth pain, wheezing or dyspnea, abd pain, nausea/diarrhea.  No h/o asthma.  Notes some improvement in the past 2 days.      Relevant past medical, surgical, family and social history reviewed and updated as indicated. Interim medical history since our last visit reviewed. Allergies and medications reviewed and updated. Outpatient Medications Prior to Visit  Medication Sig Dispense Refill   Accu-Chek FastClix Lancets MISC USE AS DIRECTED TO CHECK BLOOD SUGAR ONCE DAILY 100 each 0   atorvastatin (LIPITOR) 40 MG tablet Take 1 tablet (40 mg total) by mouth daily. 90 tablet 3   benzonatate (TESSALON) 200 MG capsule Take 1  capsule (200 mg total) by mouth 3 (three) times daily as needed. 21 capsule 0   Blood Glucose Monitoring Suppl (ACCU-CHEK GUIDE) w/Device KIT 1 each by Does not apply route as needed. Use as directed to check blood sugar once daily 1 kit 0   Cholecalciferol (VITAMIN D3) 50 MCG (2000 UT) capsule Take 1 capsule (2,000 Units total) by mouth daily.     diclofenac Sodium (VOLTAREN) 1 % GEL Apply 2 g topically 3 (three) times daily as needed (to affected joint). 100 g 1   famotidine (PEPCID) 20 MG tablet Take 1 tablet (20 mg total) by mouth 2 (two) times daily.     FARXIGA 10 MG TABS tablet TAKE 1 TABLET BY MOUTH EVERY DAY 90 tablet 1   glucose blood (ACCU-CHEK GUIDE) test strip USE AS DIRECTED TO CHECK BLOOD SUGAR ONCE DAILY 100 strip 1   hydrOXYzine (ATARAX/VISTARIL) 25 MG tablet TAKE 1/2 TO 1 TABLET BY MOUTH 3 TIMES A DAY AS NEEDED FOR ITCHING (SEDATION PRECAUTIONS) 30 tablet 0   ibuprofen (ADVIL) 800 MG tablet TAKE 1 TABLET (800 MG TOTAL) BY MOUTH 3 (THREE) TIMES DAILY AS NEEDED FOR MODERATE PAIN. 90 tablet 0   metFORMIN (GLUCOPHAGE) 500 MG tablet Take 1 tablet (500 mg total) by mouth 2 (two) times daily with a meal. 180 tablet 3   methocarbamol (ROBAXIN) 500 MG tablet Take 1 tablet (500 mg total) by mouth  3 (three) times daily as needed for muscle spasms (sedation precautions). 40 tablet 0   ondansetron (ZOFRAN-ODT) 4 MG disintegrating tablet Take 1 tablet (4 mg total) by mouth every 6 (six) hours as needed for nausea or vomiting. 15 tablet 0   pantoprazole (PROTONIX) 40 MG tablet Take 1 tablet (40 mg total) by mouth daily as needed (heartburn). 30 tablet 3   oseltamivir (TAMIFLU) 75 MG capsule Take 1 capsule (75 mg total) by mouth 2 (two) times daily. 10 capsule 0   No facility-administered medications prior to visit.     Per HPI unless specifically indicated in ROS section below Review of Systems  Objective:  BP 132/82   Pulse 75   Temp 98.1 F (36.7 C) (Oral)   Ht 5\' 9"  (1.753 m)   Wt  180 lb 8 oz (81.9 kg)   SpO2 98%   BMI 26.66 kg/m   Wt Readings from Last 3 Encounters:  11/28/22 180 lb 8 oz (81.9 kg)  10/24/22 183 lb 3.2 oz (83.1 kg)  07/12/22 181 lb 4 oz (82.2 kg)      Physical Exam Vitals and nursing note reviewed.  Constitutional:      Appearance: Normal appearance. He is not ill-appearing.  HENT:     Head: Normocephalic and atraumatic.     Right Ear: Hearing, tympanic membrane, ear canal and external ear normal. There is no impacted cerumen.     Left Ear: Hearing, tympanic membrane, ear canal and external ear normal. There is no impacted cerumen.     Nose:     Right Sinus: No maxillary sinus tenderness or frontal sinus tenderness.     Left Sinus: No maxillary sinus tenderness or frontal sinus tenderness.     Comments: Wearing mask    Mouth/Throat:     Mouth: Mucous membranes are moist.     Pharynx: Oropharynx is clear. No oropharyngeal exudate or posterior oropharyngeal erythema.  Eyes:     Extraocular Movements: Extraocular movements intact.     Conjunctiva/sclera: Conjunctivae normal.     Pupils: Pupils are equal, round, and reactive to light.  Cardiovascular:     Rate and Rhythm: Normal rate and regular rhythm.     Pulses: Normal pulses.     Heart sounds: Normal heart sounds. No murmur heard. Pulmonary:     Effort: Pulmonary effort is normal. No respiratory distress.     Breath sounds: Normal breath sounds. No wheezing, rhonchi or rales.  Musculoskeletal:     Cervical back: Normal range of motion and neck supple. No rigidity.     Right lower leg: No edema.     Left lower leg: No edema.  Lymphadenopathy:     Cervical: No cervical adenopathy.  Skin:    General: Skin is warm and dry.     Findings: No rash.  Neurological:     Mental Status: He is alert.  Psychiatric:        Mood and Affect: Mood normal.        Behavior: Behavior normal.       Results for orders placed or performed in visit on 11/28/22  POC COVID-19 BinaxNow  Result Value  Ref Range   SARS Coronavirus 2 Ag Positive (A) Negative  POCT Influenza A/B  Result Value Ref Range   Influenza A, POC Negative Negative   Influenza B, POC Negative Negative  POCT rapid strep A  Result Value Ref Range   Rapid Strep A Screen Positive (A) Negative   Lab Results  Component Value Date   HGBA1C 7.1 (A) 06/12/2022    Assessment & Plan:   Problem List Items Addressed This Visit     Strep pharyngitis - Primary    RST positive Rx amoxicillin  Supportive measures reviewed. May continue OTC remedies, avoid decongestants.  Update if not improving with treatment.       Left temporal headache    Present for months, preceding URI symptoms.  Check ESR in 1-2 wks if ongoing symptoms r/o GCA.  Did not check today in setting of acute URI symptoms.       Relevant Orders   Sedimentation rate   COVID-19 virus infection    Tested also positive for COVID. Outside of window for Paxlovid. Supportive measures reviewed Isolation recommendations reviewed.  Update if not improving with treatment.       Other Visit Diagnoses     Body aches       Relevant Orders   POC COVID-19 BinaxNow (Completed)   POCT Influenza A/B (Completed)   Sore throat       Relevant Orders   POCT rapid strep A (Completed)        Meds ordered this encounter  Medications   amoxicillin (AMOXIL) 500 MG capsule    Sig: Take 1 capsule (500 mg total) by mouth 2 (two) times daily for 10 days.    Dispense:  20 capsule    Refill:  0    Orders Placed This Encounter  Procedures   Sedimentation rate    Standing Status:   Future    Standing Expiration Date:   11/28/2023   POC COVID-19 BinaxNow    Order Specific Question:   Previously tested for COVID-19    Answer:   No    Order Specific Question:   Resident in a congregate (group) care setting    Answer:   No    Order Specific Question:   Employed in healthcare setting    Answer:   No   POCT Influenza A/B   POCT rapid strep A    Patient  Instructions  You have tested positive for streptococcal pharyngitis as well as COVID infection. Push fluids and plenty of rest. You are outside of window for COVID antiviral. Recommend wearing mask when around unaffected people for 3 more days. Take amoxicillin 500mg  twice daily for 10 days.  May use ibuprofen for throat inflammation. Salt water gargles. Suck on cold things like popsicles or warm things like herbal teas (whichever soothes the throat better). Return if fever >101.5, worsening pain, or trouble opening/closing mouth, or hoarse voice. Good to see you today, call clinic with questions.   For left temporal headache, if ongoing after above treatment, I'd like to have you return for labs when fully better from respiratory standpoint. Labs ordered today - schedule lab visit.   Follow up plan: Return if symptoms worsen or fail to improve.  Eustaquio Boyden, MD

## 2022-11-28 NOTE — Assessment & Plan Note (Addendum)
Present for months, preceding URI symptoms.  Check ESR in 1-2 wks if ongoing symptoms r/o GCA.  Did not check today in setting of acute URI symptoms.

## 2022-11-28 NOTE — Assessment & Plan Note (Signed)
Tested also positive for COVID. Outside of window for Paxlovid. Supportive measures reviewed Isolation recommendations reviewed.  Update if not improving with treatment.

## 2022-11-28 NOTE — Patient Instructions (Addendum)
You have tested positive for streptococcal pharyngitis as well as COVID infection. Push fluids and plenty of rest. You are outside of window for COVID antiviral. Recommend wearing mask when around unaffected people for 3 more days. Take amoxicillin 500mg  twice daily for 10 days.  May use ibuprofen for throat inflammation. Salt water gargles. Suck on cold things like popsicles or warm things like herbal teas (whichever soothes the throat better). Return if fever >101.5, worsening pain, or trouble opening/closing mouth, or hoarse voice. Good to see you today, call clinic with questions.   For left temporal headache, if ongoing after above treatment, I'd like to have you return for labs when fully better from respiratory standpoint. Labs ordered today - schedule lab visit.

## 2022-11-28 NOTE — Assessment & Plan Note (Addendum)
RST positive Rx amoxicillin  Supportive measures reviewed. May continue OTC remedies, avoid decongestants.  Update if not improving with treatment.

## 2022-11-29 ENCOUNTER — Encounter: Payer: Medicaid Other | Admitting: Family Medicine

## 2022-12-06 ENCOUNTER — Other Ambulatory Visit (INDEPENDENT_AMBULATORY_CARE_PROVIDER_SITE_OTHER): Payer: 59

## 2022-12-06 DIAGNOSIS — R519 Headache, unspecified: Secondary | ICD-10-CM | POA: Diagnosis not present

## 2022-12-06 LAB — SEDIMENTATION RATE: Sed Rate: 34 mm/h — ABNORMAL HIGH (ref 0–20)

## 2022-12-10 DIAGNOSIS — Z419 Encounter for procedure for purposes other than remedying health state, unspecified: Secondary | ICD-10-CM | POA: Diagnosis not present

## 2022-12-19 ENCOUNTER — Other Ambulatory Visit: Payer: Self-pay | Admitting: Family Medicine

## 2022-12-24 ENCOUNTER — Other Ambulatory Visit: Payer: Self-pay | Admitting: Family Medicine

## 2022-12-25 NOTE — Telephone Encounter (Signed)
Ibuprofen Last filled:  11/27/22, #90 Last OV:  11/28/22, strep & Covid+ Next OV:  02/09/23, CPE

## 2023-01-10 DIAGNOSIS — Z419 Encounter for procedure for purposes other than remedying health state, unspecified: Secondary | ICD-10-CM | POA: Diagnosis not present

## 2023-01-24 ENCOUNTER — Other Ambulatory Visit: Payer: Self-pay | Admitting: Family Medicine

## 2023-01-24 DIAGNOSIS — M1711 Unilateral primary osteoarthritis, right knee: Secondary | ICD-10-CM

## 2023-01-24 NOTE — Telephone Encounter (Signed)
 Ibuprofen  Last filled:  12/26/22, #90 Last OV:  11/28/22, acute upper respiratory sxs Next OV:  02/09/23, CPE

## 2023-01-25 NOTE — Telephone Encounter (Signed)
Lvm asking pt to call back. Need to get answer to Dr Timoteo Expose question and relay his message concerning refill request.

## 2023-01-25 NOTE — Telephone Encounter (Signed)
Ibuprofen last filled #90 12/26/2022.  Please call for update - how is he using ibuprofen currently? Would be hesitant to use TID scheduled given concern over GI upset, harsh effect on kidneys.

## 2023-01-26 ENCOUNTER — Other Ambulatory Visit: Payer: Self-pay | Admitting: Family Medicine

## 2023-01-26 DIAGNOSIS — E1169 Type 2 diabetes mellitus with other specified complication: Secondary | ICD-10-CM

## 2023-01-26 NOTE — Telephone Encounter (Signed)
 Lvm asking pt to call back. Need to get answer to Dr Timoteo Expose question and relay his message concerning refill request.

## 2023-01-27 ENCOUNTER — Other Ambulatory Visit: Payer: Self-pay | Admitting: Family Medicine

## 2023-01-27 DIAGNOSIS — E559 Vitamin D deficiency, unspecified: Secondary | ICD-10-CM

## 2023-01-27 DIAGNOSIS — Z125 Encounter for screening for malignant neoplasm of prostate: Secondary | ICD-10-CM

## 2023-01-27 DIAGNOSIS — D696 Thrombocytopenia, unspecified: Secondary | ICD-10-CM

## 2023-01-27 DIAGNOSIS — R519 Headache, unspecified: Secondary | ICD-10-CM

## 2023-01-27 DIAGNOSIS — E1169 Type 2 diabetes mellitus with other specified complication: Secondary | ICD-10-CM

## 2023-01-29 NOTE — Telephone Encounter (Signed)
 Lvm asking pt to call back. Need to get answer to Dr Timoteo Expose question and relay his message concerning refill request.

## 2023-01-30 NOTE — Telephone Encounter (Signed)
Lvm asking pt to call back. Need to get answer to Dr Timoteo Expose question and relay his message concerning refill request. Attempted several times to contact pt with no response.   Denying request. Pt needs OV for additional refills.

## 2023-01-31 ENCOUNTER — Other Ambulatory Visit: Payer: Medicaid Other

## 2023-01-31 DIAGNOSIS — R519 Headache, unspecified: Secondary | ICD-10-CM

## 2023-01-31 DIAGNOSIS — E559 Vitamin D deficiency, unspecified: Secondary | ICD-10-CM | POA: Diagnosis not present

## 2023-01-31 DIAGNOSIS — D696 Thrombocytopenia, unspecified: Secondary | ICD-10-CM | POA: Diagnosis not present

## 2023-01-31 DIAGNOSIS — E785 Hyperlipidemia, unspecified: Secondary | ICD-10-CM

## 2023-01-31 DIAGNOSIS — E1169 Type 2 diabetes mellitus with other specified complication: Secondary | ICD-10-CM

## 2023-01-31 DIAGNOSIS — Z125 Encounter for screening for malignant neoplasm of prostate: Secondary | ICD-10-CM

## 2023-01-31 LAB — CBC WITH DIFFERENTIAL/PLATELET
Basophils Absolute: 0 10*3/uL (ref 0.0–0.1)
Basophils Relative: 0.4 % (ref 0.0–3.0)
Eosinophils Absolute: 0.1 10*3/uL (ref 0.0–0.7)
Eosinophils Relative: 1.9 % (ref 0.0–5.0)
HCT: 44 % (ref 39.0–52.0)
Hemoglobin: 14.2 g/dL (ref 13.0–17.0)
Lymphocytes Relative: 50.3 % — ABNORMAL HIGH (ref 12.0–46.0)
Lymphs Abs: 2.3 10*3/uL (ref 0.7–4.0)
MCHC: 32.3 g/dL (ref 30.0–36.0)
MCV: 87.4 fL (ref 78.0–100.0)
Monocytes Absolute: 0.4 10*3/uL (ref 0.1–1.0)
Monocytes Relative: 8.2 % (ref 3.0–12.0)
Neutro Abs: 1.8 10*3/uL (ref 1.4–7.7)
Neutrophils Relative %: 39.2 % — ABNORMAL LOW (ref 43.0–77.0)
Platelets: 128 10*3/uL — ABNORMAL LOW (ref 150.0–400.0)
RBC: 5.04 Mil/uL (ref 4.22–5.81)
RDW: 14.1 % (ref 11.5–15.5)
WBC: 4.7 10*3/uL (ref 4.0–10.5)

## 2023-01-31 LAB — MICROALBUMIN / CREATININE URINE RATIO
Creatinine,U: 48.6 mg/dL
Microalb Creat Ratio: 1.4 mg/g (ref 0.0–30.0)
Microalb, Ur: <0.7 mg/dL (ref 0.0–1.9)

## 2023-01-31 LAB — VITAMIN B12: Vitamin B-12: 752 pg/mL (ref 211–911)

## 2023-01-31 LAB — PSA: PSA: 1.94 ng/mL (ref 0.10–4.00)

## 2023-01-31 LAB — LIPID PANEL
Cholesterol: 194 mg/dL (ref 0–200)
HDL: 62.9 mg/dL (ref >39.00)
LDL Cholesterol: 116 mg/dL — ABNORMAL HIGH (ref 0–99)
NonHDL: 131.08
Total CHOL/HDL Ratio: 3
Triglycerides: 76 mg/dL (ref 0.0–149.0)
VLDL: 15.2 mg/dL (ref 0.0–40.0)

## 2023-01-31 LAB — COMPREHENSIVE METABOLIC PANEL
ALT: 15 U/L (ref 0–53)
AST: 14 U/L (ref 0–37)
Albumin: 4.2 g/dL (ref 3.5–5.2)
Alkaline Phosphatase: 71 U/L (ref 39–117)
BUN: 16 mg/dL (ref 6–23)
CO2: 28 meq/L (ref 19–32)
Calcium: 9.1 mg/dL (ref 8.4–10.5)
Chloride: 103 meq/L (ref 96–112)
Creatinine, Ser: 1.03 mg/dL (ref 0.40–1.50)
GFR: 83.52 mL/min (ref >60.00)
Glucose, Bld: 199 mg/dL — ABNORMAL HIGH (ref 70–99)
Potassium: 4.1 meq/L (ref 3.5–5.1)
Sodium: 138 meq/L (ref 135–145)
Total Bilirubin: 0.3 mg/dL (ref 0.2–1.2)
Total Protein: 7 g/dL (ref 6.0–8.3)

## 2023-01-31 LAB — C-REACTIVE PROTEIN: CRP: <1 mg/dL (ref 0.5–20.0)

## 2023-01-31 LAB — HEMOGLOBIN A1C: Hgb A1c MFr Bld: 8.1 % — ABNORMAL HIGH (ref 4.6–6.5)

## 2023-01-31 LAB — SEDIMENTATION RATE: Sed Rate: 22 mm/h — ABNORMAL HIGH (ref 0–20)

## 2023-01-31 LAB — VITAMIN D 25 HYDROXY (VIT D DEFICIENCY, FRACTURES): VITD: 26.24 ng/mL — ABNORMAL LOW (ref 30.00–100.00)

## 2023-01-31 NOTE — Telephone Encounter (Signed)
Patient stated that he is taking the medication PRN which is 3x a week sometimes. He stated that when he do take them it is 2x a day instead of 3. He has a CPE scheduled for next Friday, January 31. Thank you!

## 2023-02-02 ENCOUNTER — Other Ambulatory Visit: Payer: Medicaid Other

## 2023-02-06 LAB — HM DIABETES EYE EXAM

## 2023-02-09 ENCOUNTER — Ambulatory Visit (INDEPENDENT_AMBULATORY_CARE_PROVIDER_SITE_OTHER): Payer: Medicaid Other | Admitting: Family Medicine

## 2023-02-09 ENCOUNTER — Encounter: Payer: Self-pay | Admitting: Family Medicine

## 2023-02-09 VITALS — BP 118/78 | HR 85 | Temp 98.6°F | Resp 95 | Ht 69.75 in | Wt 184.4 lb

## 2023-02-09 DIAGNOSIS — Z23 Encounter for immunization: Secondary | ICD-10-CM | POA: Diagnosis not present

## 2023-02-09 DIAGNOSIS — Z7984 Long term (current) use of oral hypoglycemic drugs: Secondary | ICD-10-CM

## 2023-02-09 DIAGNOSIS — E785 Hyperlipidemia, unspecified: Secondary | ICD-10-CM

## 2023-02-09 DIAGNOSIS — Z0000 Encounter for general adult medical examination without abnormal findings: Secondary | ICD-10-CM | POA: Diagnosis not present

## 2023-02-09 DIAGNOSIS — K219 Gastro-esophageal reflux disease without esophagitis: Secondary | ICD-10-CM

## 2023-02-09 DIAGNOSIS — D696 Thrombocytopenia, unspecified: Secondary | ICD-10-CM | POA: Diagnosis not present

## 2023-02-09 DIAGNOSIS — E559 Vitamin D deficiency, unspecified: Secondary | ICD-10-CM

## 2023-02-09 DIAGNOSIS — E1169 Type 2 diabetes mellitus with other specified complication: Secondary | ICD-10-CM

## 2023-02-09 MED ORDER — VITAMIN D3 1.25 MG (50000 UT) PO TABS: 1.0000 | ORAL_TABLET | ORAL | 3 refills | Status: DC

## 2023-02-09 MED ORDER — SITAGLIPTIN PHOSPHATE 50 MG PO TABS: 50.0000 mg | ORAL_TABLET | Freq: Every day | ORAL | 11 refills | Status: DC

## 2023-02-09 NOTE — Patient Instructions (Addendum)
Tdap today and 2nd shingrix shot today  Continue metformin and farxiga.  Trial januvia 50mg  sent to pharmacy, update [place] with effect. Let me know if unaffordable. Return in 4 months for diabetes follow up visit

## 2023-02-09 NOTE — Assessment & Plan Note (Signed)
Continue vit D 2000 international units daily.

## 2023-02-09 NOTE — Assessment & Plan Note (Signed)
Chronic, deteriorated despite metformin 500mg  bid and farxiga 10mg  daily. Discussed options including increased metformin PM dose for better AM glycemic control vs GLP1RA vs DPP4i. He would like to try [place] - will start 50mg  daily.  RTC 4 mo DM f/u visit

## 2023-02-09 NOTE — Assessment & Plan Note (Signed)
 Preventative protocols reviewed and updated unless pt declined. Discussed healthy diet and lifestyle.

## 2023-02-09 NOTE — Assessment & Plan Note (Signed)
Chronic, stable. Possible ITP.

## 2023-02-09 NOTE — Assessment & Plan Note (Signed)
Chronic, LDL above goal despite atorvastatin 40mg  without missed doses. Encouraged diet choices to keep cholesterol levels controlled.  The 10-year ASCVD risk score (Arnett DK, et al., 2019) is: 8.7%   Values used to calculate the score:     Age: 53 years     Sex: Male     Is Non-Hispanic African American: Yes     Diabetic: Yes     Tobacco smoker: No     Systolic Blood Pressure: 118 mmHg     Is BP treated: No     HDL Cholesterol: 62.9 mg/dL     Total Cholesterol: 194 mg/dL

## 2023-02-09 NOTE — Assessment & Plan Note (Signed)
Continues pantoprazole and pepcid PRN.

## 2023-02-09 NOTE — Progress Notes (Signed)
Ph: [phone] Fax: [phone]   Patient ID: Gary Long, male    DOB: February 20, 1970, 53 y.o.   MRN: [id number]  This visit was conducted in person.  BP 118/78   Pulse 85   Temp 98.6 F (37 C) (Oral)   Resp (!) 95   Ht 5' 9.75" (1.772 m)   Wt 184 lb 6 oz (83.6 kg)   BMI 26.65 kg/m    CC: CPE Subjective:   HPI: Gary Long is a 53 y.o. male presenting on 02/09/2023 for Annual Exam   Wearing Akara today  DM - continues metformin 500mg  bid and farxiga 10mg  daily. Tolerating well. No paresthesias. Occ blurry vision. Home sugar readings fluctuate, checks 3x/wk fasting 120-145.  Saw eye doctor yesterday [facility]) - no DR, record pending  Preventative: COLONOSCOPY 02/2021 - WNL, done for pos iFOB, rpt 10 yrs ([facility]) Prostate cancer screen - discussed, check yearly. No fmhx.  Lung cancer screening - not eligible  Flu shot yearly COVID vaccine - Moderna 01/2019, 02/2019, no booster Pneumovax 03/2012 Tdap - 03/2012. Tdap today  Shingrix - 11/2021, 2nd dose today  Seat belt use discussed  Sunscreen use discussed. No changing moles on skin.  Sleep - averaging 6-7 hrs/night Non smoker  Alcohol - rare  Dentist q6 mo  Eye exam yearly    Caffeine: occasionally From [place] Lives with wife and 3 daughters Edu: LPN works day shift in [place]  Activity: walking  Diet: good water, fruits/vegetables daily     Relevant past medical, surgical, family and social history reviewed and updated as indicated. Interim medical history since our last visit reviewed. Allergies and medications reviewed and updated. Outpatient Medications Prior to Visit  Medication Sig Dispense Refill   Accu-Chek FastClix Lancets MISC USE AS DIRECTED TO CHECK BLOOD SUGAR ONCE DAILY 100 each 0   atorvastatin (LIPITOR) 40 MG tablet TAKE 1 TABLET BY MOUTH EVERY DAY 90 tablet 0   Blood Glucose Monitoring Suppl (ACCU-CHEK GUIDE) w/Device KIT 1 each by Does not apply route as needed. Use as  directed to check blood sugar once daily 1 kit 0   diclofenac Sodium (VOLTAREN) 1 % GEL Apply 2 g topically 3 (three) times daily as needed (to affected joint). 100 g 1   famotidine (PEPCID) 20 MG tablet Take 1 tablet (20 mg total) by mouth 2 (two) times daily.     FARXIGA 10 MG TABS tablet TAKE 1 TABLET BY MOUTH EVERY DAY 90 tablet 1   glucose blood (ACCU-CHEK GUIDE) test strip USE AS DIRECTED TO CHECK BLOOD SUGAR ONCE DAILY 100 strip 1   hydrOXYzine (ATARAX/VISTARIL) 25 MG tablet TAKE 1/2 TO 1 TABLET BY MOUTH 3 TIMES A DAY AS NEEDED FOR ITCHING (SEDATION PRECAUTIONS) 30 tablet 0   ibuprofen (ADVIL) 800 MG tablet TAKE 1 TABLET (800 MG TOTAL) BY MOUTH 3 (THREE) TIMES DAILY AS NEEDED FOR MODERATE PAIN. 90 tablet 0   metFORMIN (GLUCOPHAGE) 500 MG tablet TAKE 1 TABLET BY MOUTH 2 TIMES DAILY WITH A MEAL. 180 tablet 0   methocarbamol (ROBAXIN) 500 MG tablet Take 1 tablet (500 mg total) by mouth 3 (three) times daily as needed for muscle spasms (sedation precautions). 40 tablet 0   ondansetron (ZOFRAN-ODT) 4 MG disintegrating tablet Take 1 tablet (4 mg total) by mouth every 6 (six) hours as needed for nausea or vomiting. 15 tablet 0   pantoprazole (PROTONIX) 40 MG tablet Take 1 tablet (40 mg total) by mouth daily as needed (heartburn).  30 tablet 3   Cholecalciferol (VITAMIN D3) 50 MCG (2000 UT) capsule Take 1 capsule (2,000 Units total) by mouth daily.     benzonatate (TESSALON) 200 MG capsule Take 1 capsule (200 mg total) by mouth 3 (three) times daily as needed. 21 capsule 0   No facility-administered medications prior to visit.     Per HPI unless specifically indicated in ROS section below Review of Systems  Constitutional:  Negative for activity change, appetite change, chills, fatigue, fever and unexpected weight change.  HENT:  Negative for hearing loss.   Eyes:  Positive for visual disturbance (occ blurry).  Respiratory:  Negative for cough, chest tightness, shortness of breath and wheezing.    Cardiovascular:  Negative for chest pain, palpitations and leg swelling.  Gastrointestinal:  Negative for abdominal distention, abdominal pain, blood in stool, constipation, diarrhea, nausea and vomiting.  Genitourinary:  Negative for difficulty urinating and hematuria.  Musculoskeletal:  Negative for arthralgias, myalgias and neck pain.  Skin:  Negative for rash.  Neurological:  Negative for dizziness, seizures, syncope and headaches.  Hematological:  Negative for adenopathy. Does not bruise/bleed easily.  Psychiatric/Behavioral:  Negative for dysphoric mood. The patient is not nervous/anxious.     Objective:  BP 118/78   Pulse 85   Temp 98.6 F (37 C) (Oral)   Resp (!) 95   Ht 5' 9.75" (1.772 m)   Wt 184 lb 6 oz (83.6 kg)   BMI 26.65 kg/m   Wt Readings from Last 3 Encounters:  02/09/23 184 lb 6 oz (83.6 kg)  11/28/22 180 lb 8 oz (81.9 kg)  10/24/22 183 lb 3.2 oz (83.1 kg)      Physical Exam Vitals and nursing note reviewed.  Constitutional:      General: He is not in acute distress.    Appearance: Normal appearance. He is well-developed. He is not ill-appearing.  HENT:     Head: Normocephalic and atraumatic.     Right Ear: Hearing, tympanic membrane, ear canal and external ear normal.     Left Ear: Hearing, tympanic membrane, ear canal and external ear normal.     Mouth/Throat:     Mouth: Mucous membranes are moist.     Pharynx: Oropharynx is clear. No oropharyngeal exudate or posterior oropharyngeal erythema.  Eyes:     General: No scleral icterus.    Extraocular Movements: Extraocular movements intact.     Conjunctiva/sclera: Conjunctivae normal.     Pupils: Pupils are equal, round, and reactive to light.  Neck:     Thyroid: No thyroid mass or thyromegaly.  Cardiovascular:     Rate and Rhythm: Normal rate and regular rhythm.     Pulses: Normal pulses.          Radial pulses are 2+ on the right side and 2+ on the left side.     Heart sounds: Normal heart sounds.  No murmur heard. Pulmonary:     Effort: Pulmonary effort is normal. No respiratory distress.     Breath sounds: Normal breath sounds. No wheezing, rhonchi or rales.  Abdominal:     General: Bowel sounds are normal. There is no distension.     Palpations: Abdomen is soft. There is no mass.     Tenderness: There is no abdominal tenderness. There is no guarding or rebound.     Hernia: No hernia is present.  Musculoskeletal:        General: Normal range of motion.     Cervical back: Normal range of  motion and neck supple.     Right lower leg: No edema.     Left lower leg: No edema.  Lymphadenopathy:     Cervical: No cervical adenopathy.  Skin:    General: Skin is warm and dry.     Findings: No rash.  Neurological:     General: No focal deficit present.     Mental Status: He is alert and oriented to person, place, and time.  Psychiatric:        Mood and Affect: Mood normal.        Behavior: Behavior normal.        Thought Content: Thought content normal.        Judgment: Judgment normal.       Results for orders placed or performed in visit on 01/31/23  PSA   Collection Time: 01/31/23  9:58 AM  Result Value Ref Range   PSA 1.94 0.10 - 4.00 ng/mL  C-reactive protein   Collection Time: 01/31/23  9:58 AM  Result Value Ref Range   CRP <1.0 0.5 - 20.0 mg/dL  Sedimentation rate   Collection Time: 01/31/23  9:58 AM  Result Value Ref Range   Sed Rate 22 (H) 0 - 20 mm/hr  Vitamin B12   Collection Time: 01/31/23  9:58 AM  Result Value Ref Range   Vitamin B-12 752 211 - 911 pg/mL  VITAMIN D 25 Hydroxy (Vit-D Deficiency, Fractures)   Collection Time: 01/31/23  9:58 AM  Result Value Ref Range   VITD 26.24 (L) 30.00 - 100.00 ng/mL  CBC with Differential/Platelet   Collection Time: 01/31/23  9:58 AM  Result Value Ref Range   WBC 4.7 4.0 - 10.5 K/uL   RBC 5.04 4.22 - 5.81 Mil/uL   Hemoglobin 14.2 13.0 - 17.0 g/dL   HCT 16.1 09.6 - 04.5 %   MCV 87.4 78.0 - 100.0 fl   MCHC 32.3  30.0 - 36.0 g/dL   RDW 40.9 81.1 - 91.4 %   Platelets 128.0 (L) 150.0 - 400.0 K/uL   Neutrophils Relative % 39.2 (L) 43.0 - 77.0 %   Lymphocytes Relative 50.3 (H) 12.0 - 46.0 %   Monocytes Relative 8.2 3.0 - 12.0 %   Eosinophils Relative 1.9 0.0 - 5.0 %   Basophils Relative 0.4 0.0 - 3.0 %   Neutro Abs 1.8 1.4 - 7.7 K/uL   Lymphs Abs 2.3 0.7 - 4.0 K/uL   Monocytes Absolute 0.4 0.1 - 1.0 K/uL   Eosinophils Absolute 0.1 0.0 - 0.7 K/uL   Basophils Absolute 0.0 0.0 - 0.1 K/uL  Comprehensive metabolic panel   Collection Time: 01/31/23  9:58 AM  Result Value Ref Range   Sodium 138 135 - 145 mEq/L   Potassium 4.1 3.5 - 5.1 mEq/L   Chloride 103 96 - 112 mEq/L   CO2 28 19 - 32 mEq/L   Glucose, Bld 199 (H) 70 - 99 mg/dL   BUN 16 6 - 23 mg/dL   Creatinine, Ser 7.82 0.40 - 1.50 mg/dL   Total Bilirubin 0.3 0.2 - 1.2 mg/dL   Alkaline Phosphatase 71 39 - 117 U/L   AST 14 0 - 37 U/L   ALT 15 0 - 53 U/L   Total Protein 7.0 6.0 - 8.3 g/dL   Albumin 4.2 3.5 - 5.2 g/dL   GFR 95.62 >13.08 mL/min   Calcium 9.1 8.4 - 10.5 mg/dL  Lipid panel   Collection Time: 01/31/23  9:58 AM  Result Value Ref Range  Cholesterol 194 0 - 200 mg/dL   Triglycerides 16.1 0.0 - 149.0 mg/dL   HDL 09.60 >45.40 mg/dL   VLDL 98.1 0.0 - 19.1 mg/dL   LDL Cholesterol 478 (H) 0 - 99 mg/dL   Total CHOL/HDL Ratio 3    NonHDL 131.08   Microalbumin / creatinine urine ratio   Collection Time: 01/31/23  9:58 AM  Result Value Ref Range   Microalb, Ur <0.7 0.0 - 1.9 mg/dL   Creatinine,U 29.5 mg/dL   Microalb Creat Ratio 1.4 0.0 - 30.0 mg/g  Hemoglobin A1c   Collection Time: 01/31/23  9:58 AM  Result Value Ref Range   Hgb A1c MFr Bld 8.1 (H) 4.6 - 6.5 %    Assessment & Plan:   Problem List Items Addressed This Visit     Health maintenance examination - Primary (Chronic)   Preventative protocols reviewed and updated unless pt declined. Discussed healthy diet and lifestyle.       Hyperlipidemia associated with type  2 diabetes mellitus (HCC)   Chronic, LDL above goal despite atorvastatin 40mg  without missed doses. Encouraged diet choices to keep cholesterol levels controlled.  The 10-year ASCVD risk score (Arnett DK, et al., 2019) is: 8.7%   Values used to calculate the score:     Age: 23 years     Sex: Male     Is Non-Hispanic African American: Yes     Diabetic: Yes     Tobacco smoker: No     Systolic Blood Pressure: 118 mmHg     Is BP treated: No     HDL Cholesterol: 62.9 mg/dL     Total Cholesterol: 194 mg/dL       Relevant Medications   sitaGLIPtin (JANUVIA) 50 MG tablet   Type 2 diabetes mellitus with other specified complication (HCC)   Chronic, deteriorated despite metformin 500mg  bid and farxiga 10mg  daily. Discussed options including increased metformin PM dose for better AM glycemic control vs GLP1RA vs DPP4i. He would like to try [place] - will start 50mg  daily.  RTC 4 mo DM f/u visit      Relevant Medications   sitaGLIPtin (JANUVIA) 50 MG tablet   GERD (gastroesophageal reflux disease)   Continues pantoprazole and pepcid PRN.       Thrombocytopenia (HCC)   Chronic, stable. Possible ITP.       Vitamin D deficiency   Continue vit D 2000 international units  daily.       Other Visit Diagnoses       Encounter for immunization       Relevant Orders   Tdap vaccine greater than or equal to 7yo IM (Completed)   Varicella-zoster vaccine IM (Completed)        Meds ordered this encounter  Medications   sitaGLIPtin (JANUVIA) 50 MG tablet    Sig: Take 1 tablet (50 mg total) by mouth daily.    Dispense:  30 tablet    Refill:  11   Cholecalciferol (VITAMIN D3) 1.25 MG (50000 UT) TABS    Sig: Take 1 tablet by mouth once a week.    Dispense:  12 tablet    Refill:  3    Orders Placed This Encounter  Procedures   Tdap vaccine greater than or equal to 7yo IM   Varicella-zoster vaccine IM    Patient Instructions  Tdap today and 2nd shingrix shot today  Continue  metformin and farxiga.  Trial januvia 50mg  sent to pharmacy, update [place] with effect. Let me know if  unaffordable. Return in 4 months for diabetes follow up visit   Follow up plan: Return in about 6 months (around 08/09/2023), or if symptoms worsen or fail to improve, for follow up visit.  Eustaquio Boyden, MD

## 2023-02-10 DIAGNOSIS — Z419 Encounter for procedure for purposes other than remedying health state, unspecified: Secondary | ICD-10-CM | POA: Diagnosis not present

## 2023-03-10 DIAGNOSIS — Z419 Encounter for procedure for purposes other than remedying health state, unspecified: Secondary | ICD-10-CM | POA: Diagnosis not present

## 2023-03-25 ENCOUNTER — Other Ambulatory Visit: Payer: Self-pay | Admitting: Family Medicine

## 2023-03-25 DIAGNOSIS — E1169 Type 2 diabetes mellitus with other specified complication: Secondary | ICD-10-CM

## 2023-04-16 ENCOUNTER — Other Ambulatory Visit: Payer: Self-pay | Admitting: Family Medicine

## 2023-04-16 DIAGNOSIS — E1169 Type 2 diabetes mellitus with other specified complication: Secondary | ICD-10-CM

## 2023-04-20 ENCOUNTER — Ambulatory Visit: Admitting: Nurse Practitioner

## 2023-04-20 ENCOUNTER — Ambulatory Visit (INDEPENDENT_AMBULATORY_CARE_PROVIDER_SITE_OTHER): Admitting: Nurse Practitioner

## 2023-04-20 ENCOUNTER — Encounter: Payer: Self-pay | Admitting: Nurse Practitioner

## 2023-04-20 VITALS — BP 124/86 | HR 79 | Temp 98.9°F | Ht 69.75 in | Wt 186.6 lb

## 2023-04-20 DIAGNOSIS — R1013 Epigastric pain: Secondary | ICD-10-CM | POA: Diagnosis not present

## 2023-04-20 LAB — COMPREHENSIVE METABOLIC PANEL WITH GFR
ALT: 11 U/L (ref 0–53)
AST: 15 U/L (ref 0–37)
Albumin: 4.2 g/dL (ref 3.5–5.2)
Alkaline Phosphatase: 69 U/L (ref 39–117)
BUN: 16 mg/dL (ref 6–23)
CO2: 29 meq/L (ref 19–32)
Calcium: 8.8 mg/dL (ref 8.4–10.5)
Chloride: 103 meq/L (ref 96–112)
Creatinine, Ser: 1 mg/dL (ref 0.40–1.50)
GFR: 86.41 mL/min (ref >60.00)
Glucose, Bld: 88 mg/dL (ref 70–99)
Potassium: 4.1 meq/L (ref 3.5–5.1)
Sodium: 137 meq/L (ref 135–145)
Total Bilirubin: 0.4 mg/dL (ref 0.2–1.2)
Total Protein: 6.8 g/dL (ref 6.0–8.3)

## 2023-04-20 LAB — LIPASE: Lipase: 37 U/L (ref 11.0–59.0)

## 2023-04-20 MED ORDER — PANTOPRAZOLE SODIUM 40 MG PO TBEC: 40.0000 mg | DELAYED_RELEASE_TABLET | Freq: Every day | ORAL | 0 refills | Status: DC | PRN

## 2023-04-20 NOTE — Patient Instructions (Signed)
 Abdominal Pain, Adult    Many things can cause belly (abdominal) pain. In most cases, belly pain is not a serious problem and can be watched and treated at home. But in some cases, it can be serious.  Your doctor will try to find the cause of your belly pain.  Follow these instructions at home:  Medicines  Take over-the-counter and prescription medicines only as told by your doctor.  Do not take medicines that help you poop (laxatives) unless told by your doctor.  General instructions  Watch your belly pain for any changes. Tell your doctor if the pain gets worse.  Drink enough fluid to keep your pee (urine) pale yellow.  Contact a doctor if:  Your belly pain changes or gets worse.  You have very bad cramping or bloating in your belly.  You vomit.  Your pain gets worse with meals, after eating, or with certain foods.  You have trouble pooping or have watery poop for more than 2-3 days.  You are not hungry, or you lose weight without trying.  You have signs of not getting enough fluid or water (dehydration). These may include:  Dark pee, very Chastain pee, or no pee.  Cracked lips or dry mouth.  Feeling sleepy or weak.  You have pain when you pee or poop.  Your belly pain wakes you up at night.  You have blood in your pee.  You have a fever.  Get help right away if:  You cannot stop vomiting.  Your pain is only in one part of your belly, like on the right side.  You have bloody or black poop, or poop that looks like tar.  You have trouble breathing.  You have chest pain.  These symptoms may be an emergency. Get help right away. Call 911.  Do not wait to see if the symptoms will go away.  Do not drive yourself to the hospital.  This information is not intended to replace advice given to you by your health care provider. Make sure you discuss any questions you have with your health care provider.  Document Revised: 10/12/2021 Document Reviewed: 10/12/2021  Elsevier Patient Education  2024 [employer].

## 2023-04-20 NOTE — Progress Notes (Signed)
 Established Patient Office Visit  Subjective:  Patient ID: Gary Long, male    DOB: 1970/05/01  Age: 53 y.o. MRN: [id number]  CC:  Chief Complaint  Patient presents with   Acute Visit    Upset stomach x 4-5 days    HPI  Gary Long is a 53 year old male presents for abdominal pain.  He has been experiencing abdominal pain for the past five days. The pain is described as dull and constant, located at the epigastric area with pain score of 8.  It began gradually and has been persistent, sometimes increasing in intensity. He has tried OTC  Pepto-Bismol, imodium and alka-seltzer that provided some relief, but the pain has not fully resolved. No diarrhea, vomiting, nausea, burping, or significant changes in appetite, although he is cautious about eating due to fear of exacerbating the pain. No shortness of breath, chest pain, or fever. Nothing aggravates or relieve the pain.  He has a history of GERD, for which he takes Pepto-Bismol, though he was prescribed pantoprazole but not taking it and thinks Pepto-Bismol more effective. He has not had pantoprazole in a while. He recalls a similar episode of abdominal pain last year, which was associated with diarrhea and resolved with Imodium.  He mentions slight constipation over the past two days. His last bowel movement was this morning, and he denies any blood in the stool. Normal urination.   HPI   Past Medical History:  Diagnosis Date   COVID-19 virus infection 11/28/2022   Diabetes mellitus type 2, controlled, without complications (HCC) 11/27/2013   History of malaria 1999   HLD (hyperlipidemia) 06/13/2013   Hyperlipidemia     Past Surgical History:  Procedure Laterality Date   COLONOSCOPY  02/2021   WNL, done for pos iFOB, rpt 10 yrs ([facility])   KNEE ARTHROSCOPY Right 01/10/2000   ?ACL repair   KNEE ARTHROSCOPY WITH DRILLING/MICROFRACTURE Right 06/21/2016   Procedure: KNEE ARTHROSCOPY WITH  DRILLING/MICROFRACTURE;  Surgeon: Tarry Kos, MD;  Location: [facility];  Service: Orthopedics;  Laterality: Right;   KNEE ARTHROSCOPY WITH MEDIAL MENISECTOMY Right 06/21/2016   Procedure: RIGHT KNEE ARTHROSCOPY WITH PARTIAL MEDIAL AND LATERAL MENISCECTOMIES, SYNOVECTOMY,  MICROFRACTURE, SUBCHONDROPLASTY;  Surgeon: Tarry Kos, MD;  Location: [facility];  Service: Orthopedics;  Laterality: Right;   KNEE ARTHROSCOPY WITH SUBCHONDROPLASTY Right 06/21/2016   Procedure: KNEE ARTHROSCOPY WITH SUBCHONDROPLASTY;  Surgeon: Tarry Kos, MD;  Location: [facility];  Service: Orthopedics;  Laterality: Right;    Family History  Problem Relation Age of Onset   Stroke Mother 31   Diabetes Mother 3   Hypertension Sister    Diabetes Sister    Cancer Neg Hx    CAD Neg Hx    Colon cancer Neg Hx    Esophageal cancer Neg Hx    Stomach cancer Neg Hx    Rectal cancer Neg Hx     Social History   Socioeconomic History   Marital status: Married    Spouse name: Not on file   Number of children: Not on file   Years of education: Not on file   Highest education level: Not on file  Occupational History   Not on file  Tobacco Use   Smoking status: Never   Smokeless tobacco: Never  Vaping Use   Vaping status: Never Used  Substance and Sexual Activity   Alcohol use: Yes    Comment: occ   Drug use: No  Sexual activity: Not on file  Other Topics Concern   Not on file  Social History Narrative   Caffeine: occasionally   Lives with wife and 3 daughters   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[place]    Activity: gym    Diet: good water, fruits/vegetables daily   From [place]   Social Drivers of Corporate investment banker Strain: Not on [employer] Insecurity: Not on file  Transportation Needs: Not on file  Physical Activity: Not on file  Stress: Not on file  Social Connections: Not on file  Intimate Partner Violence: Not on file     Outpatient  Medications Prior to Visit  Medication Sig Dispense Refill   Accu-Chek FastClix Lancets MISC USE AS DIRECTED TO CHECK BLOOD SUGAR ONCE DAILY 100 each 0   atorvastatin (LIPITOR) 40 MG tablet TAKE 1 TABLET BY MOUTH EVERY DAY 90 tablet 3   Blood Glucose Monitoring Suppl (ACCU-CHEK GUIDE) w/Device KIT 1 each by Does not apply route as needed. Use as directed to check blood sugar once daily 1 kit 0   Cholecalciferol (VITAMIN D3) 1.25 MG (50000 UT) TABS Take 1 tablet by mouth once a week. 12 tablet 3   dapagliflozin propanediol (FARXIGA) 10 MG TABS tablet TAKE 1 TABLET BY MOUTH EVERY DAY 90 tablet 3   diclofenac Sodium (VOLTAREN) 1 % GEL Apply 2 g topically 3 (three) times daily as needed (to affected joint). 100 g 1   famotidine (PEPCID) 20 MG tablet Take 1 tablet (20 mg total) by mouth 2 (two) times daily.     glucose blood (ACCU-CHEK GUIDE TEST) test strip USE AS DIRECTED TO CHECK BLOOD SUGAR ONCE DAILY 100 strip 3   hydrOXYzine (ATARAX/VISTARIL) 25 MG tablet TAKE 1/2 TO 1 TABLET BY MOUTH 3 TIMES A DAY AS NEEDED FOR ITCHING (SEDATION PRECAUTIONS) 30 tablet 0   ibuprofen (ADVIL) 800 MG tablet TAKE 1 TABLET (800 MG TOTAL) BY MOUTH 3 (THREE) TIMES DAILY AS NEEDED FOR MODERATE PAIN. 90 tablet 0   metFORMIN (GLUCOPHAGE) 500 MG tablet TAKE 1 TABLET BY MOUTH 2 TIMES DAILY WITH A MEAL. 180 tablet 0   methocarbamol (ROBAXIN) 500 MG tablet Take 1 tablet (500 mg total) by mouth 3 (three) times daily as needed for muscle spasms (sedation precautions). 40 tablet 0   ondansetron (ZOFRAN-ODT) 4 MG disintegrating tablet Take 1 tablet (4 mg total) by mouth every 6 (six) hours as needed for nausea or vomiting. 15 tablet 0   sitaGLIPtin (JANUVIA) 50 MG tablet Take 1 tablet (50 mg total) by mouth daily. 30 tablet 11   pantoprazole (PROTONIX) 40 MG tablet Take 1 tablet (40 mg total) by mouth daily as needed (heartburn). 30 tablet 3   No facility-administered medications prior to visit.    Allergies  Allergen Reactions    Chloroquine Itching   Effexor [Venlafaxine] Other (See Comments)    Arm spasms, HA, ED   Hydrocodone Other (See Comments)    Headache   Percocet [Oxycodone-Acetaminophen] Other (See Comments)    Headache    ROS Review of Systems  Gastrointestinal:  Positive for abdominal pain. Negative for constipation, diarrhea, nausea and vomiting.  Genitourinary:  Negative for hematuria.   Negative unless indicated in HPI.    Objective:    Physical Exam Constitutional:      Appearance: Normal appearance.  Cardiovascular:     Rate and Rhythm: Normal rate and regular rhythm.     Pulses: Normal pulses.     Heart  sounds: Normal heart sounds.  Abdominal:     General: Bowel sounds are normal.     Palpations: Abdomen is soft.     Tenderness: There is abdominal tenderness in the epigastric area. There is no rebound.     Hernia: No hernia is present.  Musculoskeletal:     Cervical back: Normal range of motion.  Neurological:     General: No focal deficit present.     Mental Status: He is alert. Mental status is at baseline.  Psychiatric:        Mood and Affect: Mood normal.        Behavior: Behavior normal.        Thought Content: Thought content normal.        Judgment: Judgment normal.     BP 124/86   Pulse 79   Temp 98.9 F (37.2 C)   Ht 5' 9.75" (1.772 m)   Wt 186 lb 9.6 oz (84.6 kg)   SpO2 95%   BMI 26.97 kg/m  Wt Readings from Last 3 Encounters:  04/20/23 186 lb 9.6 oz (84.6 kg)  02/09/23 184 lb 6 oz (83.6 kg)  11/28/22 180 lb 8 oz (81.9 kg)     Health Maintenance  Topic Date Due   Pneumococcal Vaccine 70-39 Years old (2 of 2 - PCV) 03/20/2013   COVID-19 Vaccine (3 - 2024-25 season) 04/24/2023 (Originally 09/10/2022)   FOOT EXAM  06/12/2023   HEMOGLOBIN A1C  07/31/2023   INFLUENZA VACCINE  08/10/2023   Diabetic kidney evaluation - eGFR measurement  01/31/2024   Diabetic kidney evaluation - Urine ACR  01/31/2024   OPHTHALMOLOGY EXAM  02/06/2024   Colonoscopy   02/17/2031   DTaP/Tdap/Td (4 - Td or Tdap) 02/08/2033   Hepatitis C Screening  Completed   HIV Screening  Completed   Zoster Vaccines- Shingrix  Completed   HPV VACCINES  Aged Out   Meningococcal B Vaccine  Aged Out    There are no preventive care reminders to display for this patient.  Lab Results  Component Value Date   TSH 1.59 11/28/2019   Lab Results  Component Value Date   WBC 4.7 01/31/2023   HGB 14.2 01/31/2023   HCT 44.0 01/31/2023   MCV 87.4 01/31/2023   PLT 128.0 (L) 01/31/2023   Lab Results  Component Value Date   NA 138 01/31/2023   K 4.1 01/31/2023   CO2 28 01/31/2023   GLUCOSE 199 (H) 01/31/2023   BUN 16 01/31/2023   CREATININE 1.03 01/31/2023   BILITOT 0.3 01/31/2023   ALKPHOS 71 01/31/2023   AST 14 01/31/2023   ALT 15 01/31/2023   PROT 7.0 01/31/2023   ALBUMIN 4.2 01/31/2023   CALCIUM 9.1 01/31/2023   ANIONGAP 12 03/12/2022   GFR 83.52 01/31/2023   Lab Results  Component Value Date   CHOL 194 01/31/2023   Lab Results  Component Value Date   HDL 62.90 01/31/2023   Lab Results  Component Value Date   LDLCALC 116 (H) 01/31/2023   Lab Results  Component Value Date   TRIG 76.0 01/31/2023   Lab Results  Component Value Date   CHOLHDL 3 01/31/2023   Lab Results  Component Value Date   HGBA1C 8.1 (H) 01/31/2023      Assessment & Plan:  Epigastric pain Assessment & Plan: 5-day history of constant, dull epigastric pain. Minimal relief with OTC medications. Differential includes gastritis, peptic ulcer disease, or other GI pathology. - Labs ordered as outlined. - Will  try pantoprazole -If labs are normal would recommend imaging for further evaluation.    Orders: -     Comprehensive metabolic panel with GFR -     Lipase -     H. pylori breath test  Other orders -     Pantoprazole Sodium; Take 1 tablet (40 mg total) by mouth daily as needed (heartburn).  Dispense: 30 tablet; Refill: 0    Follow-up: No follow-ups on file.    Kara Dies, NP

## 2023-04-20 NOTE — Assessment & Plan Note (Addendum)
 5-day history of constant, dull epigastric pain. Minimal relief with OTC medications. Differential includes gastritis, peptic ulcer disease, or other GI pathology. - Labs ordered as outlined. - Will try pantoprazole -If labs are normal would recommend imaging for further evaluation.

## 2023-04-21 DIAGNOSIS — Z419 Encounter for procedure for purposes other than remedying health state, unspecified: Secondary | ICD-10-CM | POA: Diagnosis not present

## 2023-04-22 ENCOUNTER — Other Ambulatory Visit: Payer: Self-pay | Admitting: Family Medicine

## 2023-04-22 DIAGNOSIS — E1169 Type 2 diabetes mellitus with other specified complication: Secondary | ICD-10-CM

## 2023-04-23 LAB — H. PYLORI BREATH TEST: H. pylori Breath Test: NOT DETECTED

## 2023-04-24 NOTE — Progress Notes (Signed)
 H,pylori is negative. Please call pt and check how are his symptoms now?

## 2023-04-25 ENCOUNTER — Other Ambulatory Visit: Payer: Self-pay | Admitting: Family Medicine

## 2023-04-25 DIAGNOSIS — G8929 Other chronic pain: Secondary | ICD-10-CM

## 2023-04-26 ENCOUNTER — Ambulatory Visit: Payer: Self-pay

## 2023-04-26 NOTE — Telephone Encounter (Signed)
 Robaxin Last filled:  03/24/22, #40 Last OV:  02/09/23, CPE Next OV:  06/11/23, 4 mo DM f/u

## 2023-04-26 NOTE — Telephone Encounter (Signed)
 Noted. We will see him on Monday.  I'm not sure what imaging was mentioned at last OV. Will forward to NP who saw him last week as fyi.

## 2023-04-26 NOTE — Telephone Encounter (Signed)
  Chief Complaint: stomach pain Symptoms: pain  Disposition: [] ED /[] Urgent Care (no appt availability in office) / [x] Appointment(In office/virtual)/ []  [facility]/ [] Home Care/ [] Refused Recommended Disposition /[] [facility]/ []  Follow-up with PCP Additional Notes: Pt calling back with stomach pain not easing up. Pt was seen on 4/11 and was told to call back if symptoms are still present. Pt denied nausea/vomiting/diarrhea/constipation. Appetite is "good." Pt stated pain is right below sternum still. Denies it radiating. Pt said medication is not helping and wants to have further imaging ordered that was discussed in appt. Appt was made for 4/21, but if pt can be seen sooner or it imaging can be made without appt, please reach out to pt. No pcp appt until end of April. RN advised pt to seek care at urgent care if pain intensifies, but pt refused suggestion. Pt rated pain 6/10.  Rn gave care advice and pt verbalized understanding.               Copied from CRM [id number]. Topic: Clinical - Red Word Triage >> Apr 26, 2023  8:34 AM [name] wrote: Red Word that prompted transfer to Nurse Triage: patient is still not feeling better after seeing doctor last week. Stomach is still in pain and would like to know what to do for it or if he can be seen again. Reason for Disposition  Abdominal pain is a chronic symptom (recurrent or ongoing AND present > 4 weeks)  Answer Assessment - Initial Assessment Questions 1. LOCATION: "Where does it hurt?"      Below sternum  2. RADIATION: "Does the pain shoot anywhere else?" (e.g., chest, back)     Denies  3. 5. PATTERN "Does the pain come and go, or is it constant?"    - If it comes and goes: "How long does it last?" "Do you have pain now?"     (Note: Comes and goes means the pain is intermittent. It goes away completely between bouts.)    - If constant: "Is it getting better, staying the same, or getting worse?"      (Note:  Constant means the pain never goes away completely; most serious pain is constant and gets worse.)      Severity lessens  6. SEVERITY: "How bad is the pain?"  (e.g., Scale 1-10; mild, moderate, or severe)    - MILD (1-3): Doesn't interfere with normal activities, abdomen soft and not tender to touch.     - MODERATE (4-7): Interferes with normal activities or awakens from sleep, abdomen tender to touch.     - SEVERE (8-10): Excruciating pain, doubled over, unable to do any normal activities.       6 7. RECURRENT SYMPTOM: "Have you ever had this type of stomach pain before?" If Yes, ask: "When was the last time?" and "What happened that time?"      no 8. CAUSE: "What do you think is causing the stomach pain?"     Not sure  9. RELIEVING/AGGRAVATING FACTORS: "What makes it better or worse?" (e.g., antacids, bending or twisting motion, bowel movement)     Nothing  10. OTHER SYMPTOMS: "Do you have any other symptoms?" (e.g., back pain, diarrhea, fever, urination pain, vomiting)       Denies all  Protocols used: Abdominal Pain - Male-A-AH

## 2023-04-28 ENCOUNTER — Encounter: Payer: Self-pay | Admitting: Family Medicine

## 2023-04-30 ENCOUNTER — Encounter: Payer: Self-pay | Admitting: Family

## 2023-04-30 ENCOUNTER — Other Ambulatory Visit: Payer: Self-pay | Admitting: Family

## 2023-04-30 ENCOUNTER — Telehealth: Payer: Self-pay | Admitting: Family Medicine

## 2023-04-30 ENCOUNTER — Other Ambulatory Visit (HOSPITAL_COMMUNITY): Payer: Self-pay

## 2023-04-30 ENCOUNTER — Telehealth: Payer: Self-pay

## 2023-04-30 ENCOUNTER — Ambulatory Visit (INDEPENDENT_AMBULATORY_CARE_PROVIDER_SITE_OTHER): Admitting: Family

## 2023-04-30 VITALS — BP 124/80 | HR 79 | Temp 98.1°F | Ht 69.75 in | Wt 185.4 lb

## 2023-04-30 DIAGNOSIS — M792 Neuralgia and neuritis, unspecified: Secondary | ICD-10-CM | POA: Diagnosis not present

## 2023-04-30 DIAGNOSIS — R1030 Lower abdominal pain, unspecified: Secondary | ICD-10-CM | POA: Diagnosis not present

## 2023-04-30 DIAGNOSIS — R1012 Left upper quadrant pain: Secondary | ICD-10-CM | POA: Diagnosis not present

## 2023-04-30 DIAGNOSIS — M79605 Pain in left leg: Secondary | ICD-10-CM

## 2023-04-30 DIAGNOSIS — Z114 Encounter for screening for human immunodeficiency virus [HIV]: Secondary | ICD-10-CM

## 2023-04-30 DIAGNOSIS — E1169 Type 2 diabetes mellitus with other specified complication: Secondary | ICD-10-CM

## 2023-04-30 DIAGNOSIS — M79604 Pain in right leg: Secondary | ICD-10-CM

## 2023-04-30 DIAGNOSIS — M79671 Pain in right foot: Secondary | ICD-10-CM | POA: Insufficient documentation

## 2023-04-30 DIAGNOSIS — R1013 Epigastric pain: Secondary | ICD-10-CM

## 2023-04-30 DIAGNOSIS — D696 Thrombocytopenia, unspecified: Secondary | ICD-10-CM

## 2023-04-30 DIAGNOSIS — E1140 Type 2 diabetes mellitus with diabetic neuropathy, unspecified: Secondary | ICD-10-CM | POA: Insufficient documentation

## 2023-04-30 DIAGNOSIS — Z1159 Encounter for screening for other viral diseases: Secondary | ICD-10-CM

## 2023-04-30 LAB — POC URINALSYSI DIPSTICK (AUTOMATED)
Bilirubin, UA: NEGATIVE
Blood, UA: NEGATIVE
Glucose, UA: NEGATIVE
Ketones, UA: POSITIVE — AB
Leukocytes, UA: NEGATIVE
Nitrite, UA: NEGATIVE
Protein, UA: POSITIVE — AB
Spec Grav, UA: 1.025 (ref 1.010–1.025)
Urobilinogen, UA: 0.2 U/dL
pH, UA: 5.5 (ref 5.0–8.0)

## 2023-04-30 LAB — CBC WITH DIFFERENTIAL/PLATELET
Basophils Absolute: 0 10*3/uL (ref 0.0–0.1)
Basophils Relative: 0.6 % (ref 0.0–3.0)
Eosinophils Absolute: 0.1 10*3/uL (ref 0.0–0.7)
Eosinophils Relative: 1.8 % (ref 0.0–5.0)
HCT: 43.5 % (ref 39.0–52.0)
Hemoglobin: 14 g/dL (ref 13.0–17.0)
Lymphocytes Relative: 38.9 % (ref 12.0–46.0)
Lymphs Abs: 1.7 10*3/uL (ref 0.7–4.0)
MCHC: 32.2 g/dL (ref 30.0–36.0)
MCV: 87.5 fl (ref 78.0–100.0)
Monocytes Absolute: 0.4 10*3/uL (ref 0.1–1.0)
Monocytes Relative: 9.8 % (ref 3.0–12.0)
Neutro Abs: 2.2 10*3/uL (ref 1.4–7.7)
Neutrophils Relative %: 48.9 % (ref 43.0–77.0)
Platelets: 106 10*3/uL — ABNORMAL LOW (ref 150.0–400.0)
RBC: 4.98 Mil/uL (ref 4.22–5.81)
RDW: 13.8 % (ref 11.5–15.5)
WBC: 4.4 10*3/uL (ref 4.0–10.5)

## 2023-04-30 LAB — VITAMIN B12: Vitamin B-12: 739 pg/mL (ref 211–911)

## 2023-04-30 LAB — HEMOGLOBIN A1C: Hgb A1c MFr Bld: 7 % — ABNORMAL HIGH (ref 4.6–6.5)

## 2023-04-30 LAB — COMPREHENSIVE METABOLIC PANEL WITH GFR
ALT: 33 U/L (ref 0–53)
AST: 21 U/L (ref 0–37)
Albumin: 4.3 g/dL (ref 3.5–5.2)
Alkaline Phosphatase: 68 U/L (ref 39–117)
BUN: 11 mg/dL (ref 6–23)
CO2: 27 meq/L (ref 19–32)
Calcium: 9.7 mg/dL (ref 8.4–10.5)
Chloride: 104 meq/L (ref 96–112)
Creatinine, Ser: 1.02 mg/dL (ref 0.40–1.50)
GFR: 84.36 mL/min (ref >60.00)
Glucose, Bld: 118 mg/dL — ABNORMAL HIGH (ref 70–99)
Potassium: 3.8 meq/L (ref 3.5–5.1)
Sodium: 138 meq/L (ref 135–145)
Total Bilirubin: 0.4 mg/dL (ref 0.2–1.2)
Total Protein: 7.3 g/dL (ref 6.0–8.3)

## 2023-04-30 LAB — LIPASE: Lipase: 40 U/L (ref 11.0–59.0)

## 2023-04-30 LAB — AMYLASE: Amylase: 36 U/L (ref 27–131)

## 2023-04-30 NOTE — Telephone Encounter (Signed)
 Saw patient already, thank you for speaking with the patient.  Please see note for further information.

## 2023-04-30 NOTE — Addendum Note (Signed)
 Addended by: Dewanda Foots C on: 04/30/2023 11:46 AM   Modules accepted: Orders

## 2023-04-30 NOTE — Patient Instructions (Addendum)
  I have sent in your order electronically for the following: ultrasound abdomen  at this location below. Please call to schedule the appointment at your convenience  [facility] off [address], [place] [address] Phone [phone]  8-5 pm

## 2023-04-30 NOTE — Assessment & Plan Note (Signed)
 Suspect neuropathy  Diabetic detailed foot exam completed.  B12 ordered A1c ordered  Pending results.  Could consider achilles tendinitis however bil onset  Low suspicion DVT

## 2023-04-30 NOTE — Addendum Note (Signed)
 Addended by: Gerry Krone on: 04/30/2023 04:29 PM   Modules accepted: Orders

## 2023-04-30 NOTE — Telephone Encounter (Signed)
 PA request has been Submitted. New Encounter has been or will be created for follow up. For additional info see Pharmacy Prior Auth telephone encounter from 04/30/2023.

## 2023-04-30 NOTE — Assessment & Plan Note (Signed)
 Suspect heartburn vs possible ulcer.  Continue consistent pantoprazole  x 30 days  Try to decrease and or avoid spicy foods, fried fatty foods, and also caffeine and chocolate as these can increase heartburn symptoms.

## 2023-04-30 NOTE — Assessment & Plan Note (Signed)
 Also with generalized abd pain  Amylase lipase cbc cmp ordered pending results.  Reviewed h pylori negative.  Could consider alpha gal. Will order.  Abd u/s ordered  If negative workup, consider Gi referral  If pain increases consider Ct abd pelvis Short term f/u with PCP   R/o pancreatitis,cholecystitis, alpha gal  Lower abdomen, r/o UTI, constipation Low suspicion for appendicitis and or diverticulitis

## 2023-04-30 NOTE — Assessment & Plan Note (Signed)
 Repeat A1c Suspected neuropathy work up in place

## 2023-04-30 NOTE — Telephone Encounter (Signed)
 Pharmacy Patient Advocate Encounter   Received notification from Onbase that prior authorization for Farxiga  10MG  tablets is required/requested.   Insurance verification completed.   The patient is insured through [facility] .   Per test claim: PA required; PA submitted to above mentioned insurance via CoverMyMeds Key/confirmation #/EOC [id number] Status is pending

## 2023-04-30 NOTE — Progress Notes (Signed)
 Established Patient Office Visit  Subjective:   Patient ID: Gary Long, male    DOB: [birth year]  Age: 53 y.o. MRN: [id number]  CC:  Chief Complaint  Patient presents with   Abdominal Pain    HPI: Gary Long is a 53 y.o. male presenting on 04/30/2023 for Abdominal Pain  Abdominal pain over one week Pain has improved slightly over the last two days but still there.  Otc meds included pepto bismal, pantoprazole  as well alka seltzer.  No N/V   No diarrhea or constipation. Normal regular BM , blood in stool.  Feeling dull achy pain epigastric area down to mid lower abdomen. In the past with heartburn he states the pepto bismal gives him improvement of symptoms which it has not this time. No chest pain. No abdominal cramping. No increased flatulence. Doesn't eat a lot of red meat, doesn't note any specific issue with beef or pork specifically. No fever. Eating or an empty stomach does not affect the pain. Saw someone in office as well 4/11 and labs were drawn pancreatic enzymes normal   04/20/23 h-pylori negative  Acute foot pain, started a few months ago.  And has noticed it is getting worse, had to change shoes but no improvement. Is having some burning in bil posterior heel, he will feel the pain radiating from his bil knee down to bil feet. Two days ago he didn't want to touch his lower legs because was so tender. He will have some tenderness bil posterior heels with some mild pain. He does walk on concrete flooring at work often.   Last A1c 8.1 01/31/23, started on januvia  around that time.        ROS: Negative unless specifically indicated above in HPI.   Relevant past medical history reviewed and updated as indicated.   Allergies and medications reviewed and updated.   Current Outpatient Medications:    Accu-Chek FastClix Lancets MISC, USE AS DIRECTED TO CHECK BLOOD SUGAR ONCE DAILY, Disp: 100 each, Rfl: 0   atorvastatin  (LIPITOR) 40 MG tablet, TAKE 1  TABLET BY MOUTH EVERY DAY, Disp: 90 tablet, Rfl: 3   Blood Glucose Monitoring Suppl (ACCU-CHEK GUIDE) w/Device KIT, 1 each by Does not apply route as needed. Use as directed to check blood sugar once daily, Disp: 1 kit, Rfl: 0   Cholecalciferol (VITAMIN D3) 1.25 MG (50000 UT) TABS, Take 1 tablet by mouth once a week., Disp: 12 tablet, Rfl: 3   dapagliflozin  propanediol (FARXIGA ) 10 MG TABS tablet, TAKE 1 TABLET BY MOUTH EVERY DAY, Disp: 90 tablet, Rfl: 3   diclofenac  Sodium (VOLTAREN ) 1 % GEL, Apply 2 g topically 3 (three) times daily as needed (to affected joint)., Disp: 100 g, Rfl: 1   famotidine  (PEPCID ) 20 MG tablet, Take 1 tablet (20 mg total) by mouth 2 (two) times daily., Disp: , Rfl:    glucose blood (ACCU-CHEK GUIDE TEST) test strip, USE AS DIRECTED TO CHECK BLOOD SUGAR ONCE DAILY, Disp: 100 strip, Rfl: 3   hydrOXYzine  (ATARAX /VISTARIL ) 25 MG tablet, TAKE 1/2 TO 1 TABLET BY MOUTH 3 TIMES A DAY AS NEEDED FOR ITCHING (SEDATION PRECAUTIONS), Disp: 30 tablet, Rfl: 0   ibuprofen  (ADVIL ) 800 MG tablet, TAKE 1 TABLET (800 MG TOTAL) BY MOUTH 3 (THREE) TIMES DAILY AS NEEDED FOR MODERATE PAIN., Disp: 90 tablet, Rfl: 0   metFORMIN  (GLUCOPHAGE ) 500 MG tablet, TAKE 1 TABLET BY MOUTH 2 TIMES DAILY WITH A MEAL., Disp: 180 tablet, Rfl: 3   methocarbamol  (ROBAXIN )  500 MG tablet, TAKE 1 TABLET (500 MG TOTAL) BY MOUTH 3 (THREE) TIMES DAILY AS NEEDED FOR MUSCLE SPASMS (SEDATION PRECAUTIONS)., Disp: 40 tablet, Rfl: 0   ondansetron  (ZOFRAN -ODT) 4 MG disintegrating tablet, Take 1 tablet (4 mg total) by mouth every 6 (six) hours as needed for nausea or vomiting., Disp: 15 tablet, Rfl: 0   pantoprazole  (PROTONIX ) 40 MG tablet, Take 1 tablet (40 mg total) by mouth daily as needed (heartburn)., Disp: 30 tablet, Rfl: 0   sitaGLIPtin  (JANUVIA ) 50 MG tablet, Take 1 tablet (50 mg total) by mouth daily., Disp: 30 tablet, Rfl: 11  Allergies  Allergen Reactions   Chloroquine Itching   Effexor [Venlafaxine] Other (See  Comments)    Arm spasms, HA, ED   Hydrocodone Other (See Comments)    Headache   Percocet [Oxycodone-Acetaminophen ] Other (See Comments)    Headache    Objective:   BP 124/80 (BP Location: Left Arm, Patient Position: Sitting, Cuff Size: Normal)   Pulse 79   Temp 98.1 F (36.7 C) (Temporal)   Ht 5' 9.75" (1.772 m)   Wt 185 lb 6.4 oz (84.1 kg)   SpO2 99%   BMI 26.79 kg/m    Physical Exam Vitals reviewed.  Constitutional:      General: He is not in acute distress.    Appearance: Normal appearance. He is normal weight. He is not ill-appearing, toxic-appearing or diaphoretic.  Cardiovascular:     Rate and Rhythm: Normal rate.  Pulmonary:     Effort: Pulmonary effort is normal.  Abdominal:     General: Bowel sounds are increased.     Palpations: Abdomen is soft.     Tenderness: There is abdominal tenderness (7/10 epigastric, luq 7/10 bil lower quadrants 5/10) in the right lower quadrant, epigastric area, left upper quadrant and left lower quadrant.  Musculoskeletal:        General: Normal range of motion.  Neurological:     General: No focal deficit present.     Mental Status: He is alert and oriented to person, place, and time. Mental status is at baseline.  Psychiatric:        Mood and Affect: Mood normal.        Behavior: Behavior normal.        Thought Content: Thought content normal.        Judgment: Judgment normal.    Title   Diabetic Foot Exam - detailed Visual Foot Exam completed.: Yes  Is there a history of foot ulcer?: No Is there a foot ulcer now?: No Is there swelling?: No Is there elevated skin temperature?: No Is there abnormal foot shape?: No Is there a claw toe deformity?: No Are the toenails long?: No Are the toenails thick?: No Are the toenails ingrown?: No Is the skin thin, fragile, shiny and hairless?": No Normal Range of Motion?: Yes Is there foot or ankle muscle weakness?: No Do you have pain in calf while walking?: Yes Are the shoes  appropriate in style and fit?: Yes Can the patient see the bottom of their feet?: Yes Pulse Foot Exam completed.: Yes   Right Posterior Tibialis: Present Left posterior Tibialis: Present   Right Dorsalis Pedis: Present Left Dorsalis Pedis: Present     Semmes-Weinstein Monofilament Test "+" means "has sensation" and "-" means "no sensation"  R Foot Test Control: Pos L Foot Test Control: Pos   R Site 1-Great Toe: Pos L Site 1-Great Toe: Pos   R Site 4: Pos L Site 4: Pos  R site 5: Pos L Site 5: Pos  R Site 6: Pos L Site 6: Pos     Image components are not supported.   Image components are not supported. Image components are not supported.  [place] Comments      Assessment & Plan:  Neuralgia -     Vitamin B12  LUQ abdominal pain Assessment & Plan: Also with generalized abd pain  Amylase lipase cbc cmp ordered pending results.  Reviewed h pylori negative.  Could consider alpha gal. Will order.  Abd u/s ordered  If negative workup, consider Gi referral  If pain increases consider Ct abd pelvis Short term f/u with PCP   R/o pancreatitis,cholecystitis, alpha gal  Lower abdomen, r/o UTI, constipation Low suspicion for appendicitis and or diverticulitis  Orders: -     CBC with Differential/Platelet -     US  Abdomen Complete; Future -     Amylase -     Lipase  Epigastric pain Assessment & Plan: Suspect heartburn vs possible ulcer.  Continue consistent pantoprazole  x 30 days  Try to decrease and or avoid spicy foods, fried fatty foods, and also caffeine and chocolate as these can increase heartburn symptoms.    Orders: -     CBC with Differential/Platelet -     US  Abdomen Complete; Future -     Alpha-Gal Panel  Lower abdominal pain -     CBC with Differential/Platelet -     US  Abdomen Complete; Future -     POCT Urinalysis Dipstick (Automated)  Type 2 diabetes mellitus with other specified complication, without long-term current use of insulin   (HCC) Assessment & Plan: Repeat A1c Suspected neuropathy work up in place  Orders: -     Comprehensive metabolic panel with GFR -     Hemoglobin A1c  Pain in both lower extremities Assessment & Plan: Suspect neuropathy  Diabetic detailed foot exam completed.  B12 ordered A1c ordered  Pending results.  Could consider achilles tendinitis however bil onset  Low suspicion DVT       Follow up plan: Return in about 10 days (around 05/10/2023) for f/u with PCP Dr. Mariam Shingles.  Felicita Horns, FNP

## 2023-04-30 NOTE — Telephone Encounter (Signed)
 Patient came by and stated that he needs a PA for dapagliflozin  propanediol (FARXIGA ) 10 MG TABS tablet.

## 2023-05-01 ENCOUNTER — Other Ambulatory Visit (HOSPITAL_COMMUNITY): Payer: Self-pay

## 2023-05-01 ENCOUNTER — Ambulatory Visit
Admission: RE | Admit: 2023-05-01 | Discharge: 2023-05-01 | Disposition: A | Discharge status: Home / Self Care | Source: Ambulatory Visit | Attending: Family | Admitting: Family

## 2023-05-01 DIAGNOSIS — R1012 Left upper quadrant pain: Secondary | ICD-10-CM | POA: Insufficient documentation

## 2023-05-01 DIAGNOSIS — R1030 Lower abdominal pain, unspecified: Secondary | ICD-10-CM | POA: Diagnosis present

## 2023-05-01 DIAGNOSIS — R1013 Epigastric pain: Secondary | ICD-10-CM | POA: Diagnosis present

## 2023-05-01 NOTE — Telephone Encounter (Signed)
 Pharmacy Patient Advocate Encounter  Received notification from [facility] Medicaid that Prior Authorization for Farxiga  10mg  tabs has been APPROVED from 04/16/23 to 04/29/24   PA #/Case ID/Reference #: [id number]  Left a message at [facility] to notify of the approval and indexed approval letter to media tab.

## 2023-05-01 NOTE — Telephone Encounter (Signed)
 Noted.

## 2023-05-02 ENCOUNTER — Encounter: Payer: Self-pay | Admitting: Family

## 2023-05-04 ENCOUNTER — Encounter: Payer: Self-pay | Admitting: Family Medicine

## 2023-05-04 ENCOUNTER — Ambulatory Visit: Admitting: Family Medicine

## 2023-05-04 VITALS — BP 124/80 | HR 69 | Temp 98.7°F | Ht 69.75 in | Wt 183.0 lb

## 2023-05-04 DIAGNOSIS — E1169 Type 2 diabetes mellitus with other specified complication: Secondary | ICD-10-CM

## 2023-05-04 DIAGNOSIS — E1140 Type 2 diabetes mellitus with diabetic neuropathy, unspecified: Secondary | ICD-10-CM

## 2023-05-04 DIAGNOSIS — R1013 Epigastric pain: Secondary | ICD-10-CM | POA: Diagnosis not present

## 2023-05-04 DIAGNOSIS — E785 Hyperlipidemia, unspecified: Secondary | ICD-10-CM

## 2023-05-04 DIAGNOSIS — M79672 Pain in left foot: Secondary | ICD-10-CM

## 2023-05-04 DIAGNOSIS — M2550 Pain in unspecified joint: Secondary | ICD-10-CM

## 2023-05-04 DIAGNOSIS — M79671 Pain in right foot: Secondary | ICD-10-CM

## 2023-05-04 DIAGNOSIS — D696 Thrombocytopenia, unspecified: Secondary | ICD-10-CM | POA: Diagnosis not present

## 2023-05-04 DIAGNOSIS — Z7984 Long term (current) use of oral hypoglycemic drugs: Secondary | ICD-10-CM

## 2023-05-04 MED ORDER — GABAPENTIN 100 MG PO CAPS: 100.0000 mg | ORAL_CAPSULE | Freq: Every day | ORAL | 3 refills | Status: DC

## 2023-05-04 MED ORDER — ROSUVASTATIN CALCIUM 20 MG PO TABS: 20.0000 mg | ORAL_TABLET | Freq: Every day | ORAL | 6 refills | Status: DC

## 2023-05-04 NOTE — Patient Instructions (Addendum)
 Continue pantoprazole  40mg  daily. Let me know if recurrent epigastric pain to discuss CT of abdomen.  For foot pain - sounds like peripheral neuropathy, likely related to diabetes.  Could also be component of plantar fasciitis - treat this with gentle stretching, frozen water bottle massage, heel lift gel insert into shoes. Exercises provided for plantar fasciitis.  For neuropathic nerve pain, start gabapentin  100mg  nightly, may increase by 100mg  ever 3 nights to total 300mg  at night.  Switch to Crestor (rosuvastatin) 20mg  nighlty in place of atorvastatin .

## 2023-05-04 NOTE — Progress Notes (Signed)
 Ph: [phone] Fax: [phone]   Patient ID: Gary Long, male    DOB: August 21, 1970, 53 y.o.   MRN: [id number]  This visit was conducted in person.  BP 124/80   Pulse 69   Temp 98.7 F (37.1 C) (Oral)   Ht 5' 9.75" (1.772 m)   Wt 183 lb (83 kg)   SpO2 97%   BMI 26.45 kg/m    CC: foot pain, lab results  Subjective:   HPI: Gary Long is a 53 y.o. male presenting on 05/04/2023 for Foot Pain (C/o ongoing B foot pain- bottom. Also, wants to discuss recent low platelet lab results. )   Planning trip to [place] for 1 month in July - will need malaria ppx.   Recently seen for ongoing epigastric pain - started on pantoprazole  40mg  daily. Eval including neg H pylori breath test, normal CMP and lipase, abd US  WNL.   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   Abd pain is significantly improved, he describes dull epigastric pain. No weight loss, no dysphagia, no early satiety.   Bilateral foot pain at soles ongoing for months - pain described as burning when wearing shoes and standing for prolonged periods, pain starts at heels. Worse when working for several days straight - significant walking on concrete. Changed shoes without much benefit. Wears [place] sneakers. No numbness/weakness of legs, no significant radiculopathy.   Notes diffuse joint pains - R 1st CMC joint, back, shoulders, knees. Does have hip/shoulder stiffness. No warmth, redness or swelling of joints. No prednisone  use. No h/o gout.  Lab Results  Component Value Date   ESRSEDRATE 22 (H) 01/31/2023  No results found for: "LABURIC"  DM - recent Farxiga  commencement, PA approved through CoverMyMeds. He continues januvia  50mg  daily and metformin  500mg  bid.  Lab Results  Component Value Date   HGBA1C 7.0 (H) 04/30/2023         Relevant past medical, surgical, family and social history reviewed and updated as indicated. Interim medical history since our last visit reviewed. Allergies and medications reviewed and updated. Outpatient Medications Prior to Visit  Medication Sig Dispense Refill   Accu-Chek FastClix Lancets MISC USE AS DIRECTED TO CHECK BLOOD SUGAR ONCE DAILY 100 each 0   Blood Glucose Monitoring Suppl (ACCU-CHEK GUIDE) w/Device KIT 1 each by Does not apply route as needed. Use as directed to check blood sugar once daily 1 kit 0   Cholecalciferol (VITAMIN D3) 1.25 MG (50000 UT) TABS Take 1 tablet by mouth once a week. 12 tablet 3   dapagliflozin  propanediol (FARXIGA ) 10 MG TABS tablet TAKE 1 TABLET BY MOUTH EVERY DAY 90 tablet 3   diclofenac  Sodium (VOLTAREN ) 1 % GEL Apply 2 g topically 3 (three) times daily as needed (to affected joint). 100 g 1   famotidine  (PEPCID ) 20 MG tablet Take 1 tablet (20 mg total) by mouth 2 (two) times daily.     glucose blood (ACCU-CHEK GUIDE TEST) test strip USE AS DIRECTED TO CHECK BLOOD SUGAR ONCE DAILY 100 strip 3   hydrOXYzine  (ATARAX /VISTARIL ) 25 MG tablet TAKE 1/2 TO 1 TABLET BY MOUTH 3 TIMES A DAY AS NEEDED FOR ITCHING (SEDATION PRECAUTIONS) 30 tablet 0   ibuprofen  (ADVIL ) 800 MG tablet TAKE 1 TABLET (800 MG TOTAL) BY MOUTH 3 (THREE) TIMES DAILY AS NEEDED FOR  MODERATE PAIN. 90 tablet 0   metFORMIN  (GLUCOPHAGE ) 500 MG tablet TAKE 1 TABLET BY MOUTH 2 TIMES DAILY WITH A MEAL. 180 tablet 3   methocarbamol  (ROBAXIN ) 500 MG tablet TAKE 1 TABLET (500 MG TOTAL) BY MOUTH 3 (THREE) TIMES DAILY AS NEEDED FOR MUSCLE SPASMS (SEDATION PRECAUTIONS). 40 tablet 0   ondansetron  (ZOFRAN -ODT) 4 MG disintegrating tablet Take 1 tablet (4 mg total) by mouth every 6 (six) hours as needed for nausea or vomiting. 15 tablet 0   pantoprazole  (PROTONIX ) 40 MG tablet Take 1 tablet (40 mg total) by mouth daily as needed (heartburn). 30 tablet 0   sitaGLIPtin  (JANUVIA ) 50 MG tablet Take 1 tablet  (50 mg total) by mouth daily. 30 tablet 11   atorvastatin  (LIPITOR) 40 MG tablet TAKE 1 TABLET BY MOUTH EVERY DAY 90 tablet 3   No facility-administered medications prior to visit.     Per HPI unless specifically indicated in ROS section below Review of Systems  Objective:  BP 124/80   Pulse 69   Temp 98.7 F (37.1 C) (Oral)   Ht 5' 9.75" (1.772 m)   Wt 183 lb (83 kg)   SpO2 97%   BMI 26.45 kg/m   Wt Readings from Last 3 Encounters:  05/04/23 183 lb (83 kg)  04/30/23 185 lb 6.4 oz (84.1 kg)  04/20/23 186 lb 9.6 oz (84.6 kg)      Physical Exam Vitals and nursing note reviewed.  Constitutional:      Appearance: Normal appearance. He is not ill-appearing.  HENT:     Mouth/Throat:     Mouth: Mucous membranes are moist.     Pharynx: Oropharynx is clear. No oropharyngeal exudate or posterior oropharyngeal erythema.  Eyes:     Extraocular Movements: Extraocular movements intact.     Conjunctiva/sclera: Conjunctivae normal.     Pupils: Pupils are equal, round, and reactive to light.  Cardiovascular:     Rate and Rhythm: Normal rate and regular rhythm.     Pulses: Normal pulses.     Heart sounds: Normal heart sounds. No murmur heard. Pulmonary:     Effort: Pulmonary effort is normal. No respiratory distress.     Breath sounds: Normal breath sounds. No wheezing, rhonchi or rales.  Abdominal:     General: Bowel sounds are normal. There is no distension.     Palpations: Abdomen is soft. There is no mass.     Tenderness: There is abdominal tenderness (mild) in the epigastric area. There is no guarding or rebound. Negative signs include Murphy's sign.     Hernia: No hernia is present.  Musculoskeletal:        General: Tenderness present.     Right lower leg: No edema.     Left lower leg: No edema.     Comments:  FROM bilateral ankles Mild tenderness to palpation R>L heels, pain with calc squeeze test R>L Discomfort to palpation of R>L plantar fascias  No pain at malleoli,  no pain at achilles tendon or insertion   Skin:    General: Skin is warm and dry.     Findings: No erythema or rash.  Neurological:     Mental Status: He is alert.  Psychiatric:        Mood and Affect: Mood normal.        Behavior: Behavior normal.       Results for orders placed or performed in visit on 04/30/23  POCT Urinalysis Dipstick (Automated)   Collection Time: 04/30/23 11:45  AM  Result Value Ref Range   Color, UA yellow    Clarity, UA clear    Glucose, UA Negative Negative   Bilirubin, UA negative    Ketones, UA positive (A)    Spec Grav, UA 1.025 1.010 - 1.025   Blood, UA negative    pH, UA 5.5 5.0 - 8.0   Protein, UA Positive (A) Negative   Urobilinogen, UA 0.2 0.2 or 1.0 E.U./dL   Nitrite, UA negative    Leukocytes, UA Negative Negative  CBC with Differential   Collection Time: 04/30/23 11:46 AM  Result Value Ref Range   WBC 4.4 4.0 - 10.5 K/uL   RBC 4.98 4.22 - 5.81 Mil/uL   Hemoglobin 14.0 13.0 - 17.0 g/dL   HCT 09.8 11.9 - 14.7 %   MCV 87.5 78.0 - 100.0 fl   MCHC 32.2 30.0 - 36.0 g/dL   RDW 82.9 56.2 - 13.0 %   Platelets 106.0 (L) 150.0 - 400.0 K/uL   Neutrophils Relative % 48.9 43.0 - 77.0 %   Lymphocytes Relative 38.9 12.0 - 46.0 %   Monocytes Relative 9.8 3.0 - 12.0 %   Eosinophils Relative 1.8 0.0 - 5.0 %   Basophils Relative 0.6 0.0 - 3.0 %   Neutro Abs 2.2 1.4 - 7.7 K/uL   Lymphs Abs 1.7 0.7 - 4.0 K/uL   Monocytes Absolute 0.4 0.1 - 1.0 K/uL   Eosinophils Absolute 0.1 0.0 - 0.7 K/uL   Basophils Absolute 0.0 0.0 - 0.1 K/uL  Comprehensive metabolic panel with GFR   Collection Time: 04/30/23 11:46 AM  Result Value Ref Range   Sodium 138 135 - 145 mEq/L   Potassium 3.8 3.5 - 5.1 mEq/L   Chloride 104 96 - 112 mEq/L   CO2 27 19 - 32 mEq/L   Glucose, Bld 118 (H) 70 - 99 mg/dL   BUN 11 6 - 23 mg/dL   Creatinine, Ser 8.65 0.40 - 1.50 mg/dL   Total Bilirubin 0.4 0.2 - 1.2 mg/dL   Alkaline Phosphatase 68 39 - 117 U/L   AST 21 0 - 37 U/L   ALT 33  0 - 53 U/L   Total Protein 7.3 6.0 - 8.3 g/dL   Albumin 4.3 3.5 - 5.2 g/dL   GFR 78.46 >96.29 mL/min   Calcium  9.7 8.4 - 10.5 mg/dL  Vitamin B28   Collection Time: 04/30/23 11:46 AM  Result Value Ref Range   Vitamin B-12 739 211 - 911 pg/mL  Hemoglobin A1c   Collection Time: 04/30/23 11:46 AM  Result Value Ref Range   Hgb A1c MFr Bld 7.0 (H) 4.6 - 6.5 %  Amylase   Collection Time: 04/30/23 11:46 AM  Result Value Ref Range   Amylase 36 27 - 131 U/L  Lipase   Collection Time: 04/30/23 11:46 AM  Result Value Ref Range   Lipase 40.0 11.0 - 59.0 U/L  Alpha-Gal Panel   Collection Time: 04/30/23 11:46 AM  Result Value Ref Range   Beef <0.10 kU/L   CLASS 0    Allergen, Mutton, f88 <0.10 kU/L   Class 0    Allergen, Pork, f26 <0.10 kU/L   CLASS 0   Interpretation:   Collection Time: 04/30/23 11:46 AM  Result Value Ref Range   Interpretation    TSH   Collection Time: 04/30/23  4:29 PM  Result Value Ref Range   TSH 2.07 0.40 - 4.50 mIU/L  Hepatitis C antibody   Collection Time: 04/30/23  4:29  PM  Result Value Ref Range   Hepatitis C Ab NON-REACTIVE NON-REACTIVE  HIV antibody (with reflex)   Collection Time: 04/30/23  4:29 PM  Result Value Ref Range   HIV 1&2 Ab, 4th Generation NON-REACTIVE NON-REACTIVE   Lab Results  Component Value Date   CHOL 194 01/31/2023   HDL 62.90 01/31/2023   LDLCALC 116 (H) 01/31/2023   TRIG 76.0 01/31/2023   CHOLHDL 3 01/31/2023    Assessment & Plan:   Problem List Items Addressed This Visit     Hyperlipidemia associated with type 2 diabetes mellitus (HCC)   See above - switch atorvastatin  40mg  to rosuvastatin 20mg        Relevant Medications   rosuvastatin (CRESTOR) 20 MG tablet   Type 2 diabetes mellitus with other specified complication (HCC)   A1c improved - recently restarted farxiga   Continue this with januvia  and metformin .       Relevant Medications   rosuvastatin (CRESTOR) 20 MG tablet   Thrombocytopenia (HCC)   Chronic,  stable. 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 Pt asxs.      Epigastric pain - Primary   This is significantly improved with daily PPI - continue this.  Discussed contrasted abdominal CT as next step if recurrent or ongoing.       Diabetic neuropathy, type II diabetes mellitus (HCC)   Suspect diabetic neuropathy. Discussed treatment options - will start gabapentin  100mg  nightly with titration to 300mg  nightly as needed. Update with effect.       Relevant Medications   rosuvastatin (CRESTOR) 20 MG tablet   Foot pain, bilateral   Anticipate related to neuropathy as well as possible plantar fasciitis. Discussed supportive measures to try at home.  Will mail PF exercises as well.       Polyarthralgia   Diffuse body aches - shoulders, back, hips, R 1st CMC, feet.  ?statin related - will switch atorvastatin  to rosuvastatin.  Recent ESR overall ok pointing against PMR.  ANA pending.         Meds ordered this encounter  Medications   gabapentin  (NEURONTIN ) 100 MG capsule    Sig: Take 1-3 capsules (100-300 mg total) by mouth at bedtime.    Dispense:  90 capsule    Refill:  3   rosuvastatin (CRESTOR) 20 MG tablet    Sig: Take 1 tablet (20 mg total) by mouth daily after supper.    Dispense:  30 tablet    Refill:  6    To replace atorvastatin     No orders of the defined types were placed in this encounter.   Patient Instructions  Continue pantoprazole  40mg  daily. Let me know if recurrent epigastric pain to discuss CT of abdomen.  For foot pain - sounds like peripheral neuropathy, likely related to diabetes.  Could also be component of plantar fasciitis - treat this with gentle stretching, frozen water bottle massage, heel lift gel insert into shoes. Exercises provided for plantar fasciitis.  For neuropathic nerve pain, start gabapentin  100mg  nightly, may increase by 100mg  ever 3 nights to total 300mg  at night.  Switch to Crestor (rosuvastatin) 20mg  nighlty in place of atorvastatin .   Follow up  plan: No follow-ups on file.  Claire Crick, MD

## 2023-05-04 NOTE — Assessment & Plan Note (Signed)
 A1c improved - recently restarted farxiga   Continue this with januvia  and metformin .

## 2023-05-04 NOTE — Assessment & Plan Note (Signed)
 Suspect diabetic neuropathy. Discussed treatment options - will start gabapentin  100mg  nightly with titration to 300mg  nightly as needed. Update with effect.

## 2023-05-04 NOTE — Assessment & Plan Note (Signed)
 Chronic, stable. 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 Pt asxs.

## 2023-05-04 NOTE — Assessment & Plan Note (Signed)
 Diffuse body aches - shoulders, back, hips, R 1st CMC, feet.  ?statin related - will switch atorvastatin  to rosuvastatin.  Recent ESR overall ok pointing against PMR.  ANA pending.

## 2023-05-04 NOTE — Assessment & Plan Note (Signed)
 See above - switch atorvastatin  40mg  to rosuvastatin 20mg 

## 2023-05-04 NOTE — Assessment & Plan Note (Signed)
 Anticipate related to neuropathy as well as possible plantar fasciitis. Discussed supportive measures to try at home.  Will mail PF exercises as well.

## 2023-05-04 NOTE — Assessment & Plan Note (Signed)
 This is significantly improved with daily PPI - continue this.  Discussed contrasted abdominal CT as next step if recurrent or ongoing.

## 2023-05-05 LAB — ALPHA-GAL PANEL
Allergen, Mutton, f88: <0.1 kU/L
Allergen, Pork, f26: <0.1 kU/L
Beef: <0.1 kU/L
CLASS: 0
CLASS: 0
Class: 0
GALACTOSE-ALPHA-1,3-GALACTOSE IGE*: <0.1 kU/L (ref <0.10)

## 2023-05-05 LAB — INTERPRETATION:

## 2023-05-05 LAB — ANA SCREEN,IFA,REFLEX TITER/PATTERN,REFLEX MPLX 11 AB CASCADE
Anti Nuclear Antibody (ANA): NEGATIVE
Cyclic Citrullin Peptide Ab: <16 U
MUTATED CITRULLINATED VIMENTIN (MCV) AB: <20 U/mL (ref <20)
Rheumatoid fact SerPl-aCnc: <10 [IU]/mL (ref <14)

## 2023-05-05 LAB — HIV ANTIBODY (ROUTINE TESTING W REFLEX): HIV 1&2 Ab, 4th Generation: NONREACTIVE

## 2023-05-05 LAB — HEPATITIS C ANTIBODY: Hepatitis C Ab: NONREACTIVE

## 2023-05-05 LAB — TSH: TSH: 2.07 m[IU]/L (ref 0.40–4.50)

## 2023-05-12 ENCOUNTER — Other Ambulatory Visit: Payer: Self-pay | Admitting: Nurse Practitioner

## 2023-05-16 ENCOUNTER — Other Ambulatory Visit: Payer: Self-pay | Admitting: Nurse Practitioner

## 2023-05-16 DIAGNOSIS — K219 Gastro-esophageal reflux disease without esophagitis: Secondary | ICD-10-CM

## 2023-06-11 ENCOUNTER — Ambulatory Visit: Payer: Medicaid Other | Admitting: Family Medicine

## 2023-06-20 ENCOUNTER — Ambulatory Visit (INDEPENDENT_AMBULATORY_CARE_PROVIDER_SITE_OTHER): Payer: Self-pay | Admitting: Family Medicine

## 2023-06-20 ENCOUNTER — Encounter: Payer: Self-pay | Admitting: Family Medicine

## 2023-06-20 VITALS — BP 120/82 | HR 78 | Temp 98.1°F | Ht 69.75 in | Wt 184.4 lb

## 2023-06-20 DIAGNOSIS — Z2839 Other underimmunization status: Secondary | ICD-10-CM

## 2023-06-20 DIAGNOSIS — K219 Gastro-esophageal reflux disease without esophagitis: Secondary | ICD-10-CM | POA: Diagnosis not present

## 2023-06-20 DIAGNOSIS — E1169 Type 2 diabetes mellitus with other specified complication: Secondary | ICD-10-CM | POA: Diagnosis not present

## 2023-06-20 DIAGNOSIS — Z7184 Encounter for health counseling related to travel: Secondary | ICD-10-CM | POA: Diagnosis not present

## 2023-06-20 DIAGNOSIS — M542 Cervicalgia: Secondary | ICD-10-CM

## 2023-06-20 MED ORDER — DOXYCYCLINE HYCLATE 100 MG PO TABS: 100.0000 mg | ORAL_TABLET | Freq: Every day | ORAL | 0 refills | Status: AC

## 2023-06-20 NOTE — Patient Instructions (Addendum)
 Measles titers today  Continue current medicines Doxycycline  prescription sent to pharmacy today - start 2d prior to travel, take for 4 weeks after returning from trip.  Return in 3-4 months for diabetes follow up

## 2023-06-20 NOTE — Assessment & Plan Note (Signed)
 Stable period on daily pantoprazole  40mg 

## 2023-06-20 NOTE — Assessment & Plan Note (Addendum)
 Chronic, stable on current regimen - continue this.  Latest A1c showing improved control down to 7%.

## 2023-06-20 NOTE — Assessment & Plan Note (Addendum)
 Suspect trapezius mm tightness. Continue ibuprofen  and methocarbamol  PRN.  Given longevity of symptoms, will refer to PT - pt agrees with plan.

## 2023-06-20 NOTE — Assessment & Plan Note (Addendum)
 Discussed upcoming planned travel to [place]. Will check measles titer given only have documented MMR x1 Discussed malaria prevention - doxycycline  vs malarone  as well as common side effects including doxycycline  photosensitivity precautions. He desires to redo doxycycline  (previously tolerated well). Discussed typhoid vaccine - it seems he last completed oral Vivotif course 2021 so is still up to date.

## 2023-06-20 NOTE — Progress Notes (Addendum)
 Ph: [phone] Fax: [phone]   Patient ID: Gary Long, male    DOB: 1970-06-19, 53 y.o.   MRN: [id number]  This visit was conducted in person.  BP 120/82   Pulse 78   Temp 98.1 F (36.7 C) (Oral)   Ht 5' 9.75 (1.772 m)   Wt 184 lb 6 oz (83.6 kg)   SpO2 98%   BMI 26.65 kg/m    CC: 4 mo DM f/u visit  Subjective:   HPI: Gary Long is a 53 y.o. male presenting on 06/20/2023 for Medical Management of Chronic Issues (Here for 4 mo DM f/u.)   DM - does regularly check sugars 120-130 fasting, this morning 108 fasting. Compliant with antihyperglycemic regimen which includes: farxiga  10mg  daily, metformin  500mg  bid, januvia  50mg  daily. Denies low sugars or hypoglycemic symptoms. Denies paresthesias, blurry vision. Last diabetic eye exam 01/2023. Glucometer brand: accuchek. Last foot exam: 04/2023. DSME: [facility]. Lab Results  Component Value Date   HGBA1C 7.0 (H) 04/30/2023   Diabetic Foot Exam - Simple   No data filed    Lab Results  Component Value Date   MICROALBUR <0.7 01/31/2023     Upcoming trip to [place] July 2nd for 4 weeks to visit family. Requests malaria ppx treatment. Has previously tolerated doxycycline  ppx course well.  He did complete vivotif oral typhoid vaccine  course, latest 06/2019 We only have records of 1 MMR. He will check at home.   Notes ongoing chronic bilateral shoulder pain points to upper trapezius mm R>L - managing with methocarbamol  500mg  at bedtime PRN and ibuprofen  800mg  about once daily as needed with temporary relief. No recent PT.      Relevant past medical, surgical, family and social history reviewed and updated as indicated. Interim medical history since our last visit reviewed. Allergies and medications reviewed and updated. Outpatient Medications Prior to Visit  Medication Sig Dispense Refill   Accu-Chek FastClix Lancets MISC USE AS DIRECTED TO CHECK BLOOD SUGAR ONCE DAILY 100 each 0   Blood Glucose  Monitoring Suppl (ACCU-CHEK GUIDE) w/Device KIT 1 each by Does not apply route as needed. Use as directed to check blood sugar once daily 1 kit 0   Cholecalciferol (VITAMIN D3) 1.25 MG (50000 UT) TABS Take 1 tablet by mouth once a week. 12 tablet 3   dapagliflozin  propanediol (FARXIGA ) 10 MG TABS tablet TAKE 1 TABLET BY MOUTH EVERY DAY 90 tablet 3   diclofenac  Sodium (VOLTAREN ) 1 % GEL Apply 2 g topically 3 (three) times daily as needed (to affected joint). 100 g 1   famotidine  (PEPCID ) 20 MG tablet Take 1 tablet (20 mg total) by mouth 2 (two) times daily.     gabapentin  (NEURONTIN ) 100 MG capsule Take 1-3 capsules (100-300 mg total) by mouth at bedtime. 90 capsule 3   glucose blood (ACCU-CHEK GUIDE TEST) test strip USE AS DIRECTED TO CHECK BLOOD SUGAR ONCE DAILY 100 strip 3   hydrOXYzine  (ATARAX /VISTARIL ) 25 MG tablet TAKE 1/2 TO 1 TABLET BY MOUTH 3 TIMES A DAY AS NEEDED FOR ITCHING (SEDATION PRECAUTIONS) 30 tablet 0   ibuprofen  (ADVIL ) 800 MG tablet TAKE 1 TABLET (800 MG TOTAL) BY MOUTH 3 (THREE) TIMES DAILY AS NEEDED FOR MODERATE PAIN. 90 tablet 0   metFORMIN  (GLUCOPHAGE ) 500 MG tablet TAKE 1 TABLET BY MOUTH 2 TIMES DAILY WITH A MEAL. 180 tablet 3   methocarbamol  (ROBAXIN ) 500 MG tablet TAKE 1 TABLET (500 MG TOTAL) BY MOUTH 3 (THREE) TIMES  DAILY AS NEEDED FOR MUSCLE SPASMS (SEDATION PRECAUTIONS). 40 tablet 0   ondansetron  (ZOFRAN -ODT) 4 MG disintegrating tablet Take 1 tablet (4 mg total) by mouth every 6 (six) hours as needed for nausea or vomiting. 15 tablet 0   pantoprazole  (PROTONIX ) 40 MG tablet TAKE 1 TABLET (40 MG TOTAL) BY MOUTH DAILY AS NEEDED (HEARTBURN). 90 tablet 2   rosuvastatin  (CRESTOR ) 20 MG tablet Take 1 tablet (20 mg total) by mouth daily after supper. 30 tablet 6   sitaGLIPtin  (JANUVIA ) 50 MG tablet Take 1 tablet (50 mg total) by mouth daily. 30 tablet 11   No facility-administered medications prior to visit.     Per HPI unless specifically indicated in ROS section  below Review of Systems  Objective:  BP 120/82   Pulse 78   Temp 98.1 F (36.7 C) (Oral)   Ht 5' 9.75 (1.772 m)   Wt 184 lb 6 oz (83.6 kg)   SpO2 98%   BMI 26.65 kg/m   Wt Readings from Last 3 Encounters:  06/20/23 184 lb 6 oz (83.6 kg)  05/04/23 183 lb (83 kg)  04/30/23 185 lb 6.4 oz (84.1 kg)      Physical Exam Vitals and nursing note reviewed.  Constitutional:      Appearance: Normal appearance. He is not ill-appearing.  HENT:     Head: Normocephalic and atraumatic.  Eyes:     Extraocular Movements: Extraocular movements intact.     Conjunctiva/sclera: Conjunctivae normal.     Pupils: Pupils are equal, round, and reactive to light.  Neck:     Comments:  FROM cervical neck  Bilateral R>L trapezius mm tenderness without spasm Cardiovascular:     Rate and Rhythm: Normal rate and regular rhythm.     Pulses: Normal pulses.     Heart sounds: Normal heart sounds. No murmur heard. Pulmonary:     Effort: Pulmonary effort is normal. No respiratory distress.     Breath sounds: Normal breath sounds. No wheezing, rhonchi or rales.  Musculoskeletal:     Cervical back: Normal range of motion and neck supple. Tenderness present. No rigidity.     Right lower leg: No edema.     Left lower leg: No edema.     Comments: See HPI for foot exam if done  Skin:    General: Skin is warm and dry.     Findings: No rash.  Neurological:     Mental Status: He is alert.  Psychiatric:        Mood and Affect: Mood normal.        Behavior: Behavior normal.        Assessment & Plan:   Problem List Items Addressed This Visit     Type 2 diabetes mellitus with other specified complication (HCC) - Primary   Chronic, stable on current regimen - continue this.  Latest A1c showing improved control down to 7%.       GERD (gastroesophageal reflux disease)   Stable period on daily pantoprazole  40mg        Neck pain, musculoskeletal   Suspect trapezius mm tightness. Continue ibuprofen  and  methocarbamol  PRN.  Given longevity of symptoms, will refer to PT - pt agrees with plan.       Relevant Orders   Ambulatory referral to Physical Therapy   Encounter for health counseling related to travel   Discussed upcoming planned travel to [place]. Will check measles titer given only have documented MMR x1 Discussed malaria prevention - doxycycline  vs malarone   as well as common side effects including doxycycline  photosensitivity precautions. He desires to redo doxycycline  (previously tolerated well). Discussed typhoid vaccine - it seems he last completed oral Vivotif course 2021 so is still up to date.       Other Visit Diagnoses       Immunization deficiency       Relevant Orders   Rubeola antibody IgG        Meds ordered this encounter  Medications   doxycycline  (VIBRA -TABS) 100 MG tablet    Sig: Take 1 tablet (100 mg total) by mouth daily.    Dispense:  60 tablet    Refill:  0    Orders Placed This Encounter  Procedures   Rubeola antibody IgG   Ambulatory referral to Physical Therapy    Referral Priority:   Routine    Referral Type:   Physical Medicine    Referral Reason:   Specialty Services Required    Requested Specialty:   Physical Therapy    Number of Visits Requested:   1    Patient Instructions  Measles titers today  Continue current medicines Doxycycline  prescription sent to pharmacy today - start 2d prior to travel, take for 4 weeks after returning from trip.  Return in 3-4 months for diabetes follow up  Follow up plan: Return in about 3 months (around 09/20/2023), or if symptoms worsen or fail to improve, for follow up visit.  Claire Crick, MD

## 2023-06-21 LAB — RUBEOLA ANTIBODY IGG: Rubeola IgG: 15.4 [AU]/ml — ABNORMAL LOW

## 2023-06-25 ENCOUNTER — Ambulatory Visit: Payer: Self-pay | Admitting: Family Medicine

## 2023-06-26 ENCOUNTER — Encounter: Payer: Self-pay | Admitting: Family Medicine

## 2023-06-26 MED ORDER — TADALAFIL 20 MG PO TABS: 10.0000 mg | ORAL_TABLET | ORAL | 3 refills | Status: AC | PRN

## 2023-06-29 NOTE — Telephone Encounter (Signed)
 Called patient nothing further needed at this time.

## 2023-06-29 NOTE — Telephone Encounter (Unsigned)
 Copied from CRM [id number]. Topic: General - Call Back - No Documentation >> Jun 29, 2023  1:23 PM Jim Motts C wrote: Reason for CRM: Patient returned call for Dr.Gutierrez medical assistant. Patients contact is [phone]. Patient is scheduled just injection appointment.

## 2023-07-03 ENCOUNTER — Ambulatory Visit: Payer: Self-pay

## 2023-07-10 ENCOUNTER — Ambulatory Visit: Payer: Self-pay

## 2023-07-10 ENCOUNTER — Other Ambulatory Visit: Payer: Self-pay | Admitting: Family Medicine

## 2023-07-10 DIAGNOSIS — M542 Cervicalgia: Secondary | ICD-10-CM

## 2023-07-10 DIAGNOSIS — Z23 Encounter for immunization: Secondary | ICD-10-CM | POA: Diagnosis not present

## 2023-07-10 NOTE — Telephone Encounter (Signed)
 Ibuprofen  Last filled:  12/26/22, #90 Last OV:  06/20/23, 4 mo DM f/u Next OV:  none

## 2023-07-10 NOTE — Progress Notes (Signed)
 Per orders of Dr. Anton Blas, injection of MMR given by Nellie Hummer in left arm. Patient tolerated injection well. Does patient need to return for #2 MMR?

## 2023-08-15 ENCOUNTER — Telehealth: Payer: Self-pay | Admitting: Family Medicine

## 2023-08-15 NOTE — Telephone Encounter (Signed)
 Copied from CRM [id number]. Topic: Appointments - Scheduling Inquiry for Clinic >> Aug 15, 2023  3:45 PM Gary Long wrote: Reason for CRM: Patient would like a TB shot.

## 2023-08-16 NOTE — Telephone Encounter (Signed)
 Attempted to contact pt. No answer, vm box full. Plz get clarification of message. Does pt need TB skin test done. If so, schedule NV on a Tues.

## 2023-08-16 NOTE — Telephone Encounter (Signed)
 Unable to reach patient by phone and unable to leave v/m because v/m not set up; sending to [facility] pool.

## 2023-08-17 ENCOUNTER — Telehealth: Payer: Self-pay | Admitting: Family Medicine

## 2023-08-17 NOTE — Telephone Encounter (Signed)
 Patient dropped off document medical clearance , to be filled out by provider. Patient requested to send it back via Call Patient to pick up within 5-days. Document is located in providers tray at front office.Please advise at [facility] [phone]

## 2023-08-17 NOTE — Telephone Encounter (Signed)
 Attempted to contact pt. No answer, vm box full. Plz get clarification of message. Does pt need TB skin test done. If so, schedule NV on a Tues.

## 2023-08-20 NOTE — Telephone Encounter (Signed)
 Spoke with pt asking for clarification about message about TB shot. Pt states he needed a skin test but has already had it done some where else. Nothing else needed at this time.

## 2023-08-20 NOTE — Telephone Encounter (Signed)
 Placed form in Dr. Talmadge box.

## 2023-08-20 NOTE — Telephone Encounter (Addendum)
 Filled and in CMA box.

## 2023-08-21 NOTE — Telephone Encounter (Signed)
 Called patient let know ready for pick up. Copy sent to scan and original placed in pick up area.

## 2023-08-21 NOTE — Telephone Encounter (Signed)
 Pt came in and picked up from

## 2023-08-31 ENCOUNTER — Ambulatory Visit: Admitting: Family Medicine

## 2023-08-31 ENCOUNTER — Encounter: Payer: Self-pay | Admitting: Family Medicine

## 2023-08-31 ENCOUNTER — Ambulatory Visit (INDEPENDENT_AMBULATORY_CARE_PROVIDER_SITE_OTHER)
Admission: RE | Admit: 2023-08-31 | Discharge: 2023-08-31 | Disposition: A | Discharge status: Home / Self Care | Source: Ambulatory Visit | Attending: Family Medicine | Admitting: Family Medicine

## 2023-08-31 ENCOUNTER — Ambulatory Visit: Payer: Self-pay | Admitting: Family Medicine

## 2023-08-31 VITALS — BP 134/82 | HR 111 | Temp 97.2°F | Ht 69.75 in | Wt 188.0 lb

## 2023-08-31 DIAGNOSIS — M79641 Pain in right hand: Secondary | ICD-10-CM

## 2023-08-31 NOTE — Telephone Encounter (Signed)
 Appreciate Dr Avelina seeing pleasant patient.

## 2023-08-31 NOTE — Progress Notes (Signed)
 Patient ID: Gary Long, male    DOB: 12/16/1970, 53 y.o.   MRN: [id number]  This visit was conducted in person.  BP 134/82   Pulse (!) 111   Temp (!) 97.2 F (36.2 C) (Temporal)   Ht 5' 9.75 (1.772 m)   Wt 188 lb (85.3 kg)   SpO2 96%   BMI 27.17 kg/m    CC:  Chief Complaint  Patient presents with   Hand Pain    Right hand pain for 3 days     Subjective:   HPI: Gary Long is a 53 y.o. male patient of Dr. Rilla with history of diabetes, hyperlipidemia, osteoarthritis of the knee, thrombocytopenia and diabetic neuropathy presenting on 08/31/2023 for Hand Pain (Right hand pain for 3 days )  New  sudden onset onset right hand pain in the last 3 days out of the blue    Sore in MCP joints, numb in ring finger, Noted some swelling in palm side over MCP joints.  Slightly warm.  Decreased motion and stiffness on 4th digit.   He has used ice, ibuprofen  800 mg TID.SABRA. helps some.   Still some soreness in 4th MCP joint.   No change in physical activity, no falls. 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   No personal or family history of gout. Does have chronic neck pain. Has history of neck pain.  Lab Results  Component Value Date   HGBA1C 7.0 (H) 04/30/2023         Relevant past medical, surgical, family and social history reviewed and updated as indicated. Interim medical history since our last visit reviewed. Allergies and medications reviewed and updated. Outpatient Medications Prior to Visit  Medication Sig Dispense Refill   Accu-Chek FastClix Lancets MISC USE AS DIRECTED TO CHECK BLOOD SUGAR ONCE DAILY 100 each 0   Blood Glucose Monitoring Suppl (ACCU-CHEK GUIDE) w/Device KIT 1 each by Does not apply route as needed. Use as directed to check blood sugar once daily 1 kit 0   Cholecalciferol (VITAMIN D3) 1.25 MG (50000 UT) TABS Take 1 tablet by mouth once a week. 12 tablet 3   dapagliflozin  propanediol (FARXIGA ) 10 MG TABS tablet  TAKE 1 TABLET BY MOUTH EVERY DAY 90 tablet 3   diclofenac  Sodium (VOLTAREN ) 1 % GEL Apply 2 g topically 3 (three) times daily as needed (to affected joint). 100 g 1   gabapentin  (NEURONTIN ) 100 MG capsule Take 1-3 capsules (100-300 mg total) by mouth at bedtime. 90 capsule 3   glucose blood (ACCU-CHEK GUIDE TEST) test strip USE AS DIRECTED TO CHECK BLOOD SUGAR ONCE DAILY 100 strip 3   hydrOXYzine  (ATARAX /VISTARIL ) 25 MG tablet TAKE 1/2 TO 1 TABLET BY MOUTH 3 TIMES A DAY AS NEEDED FOR ITCHING (SEDATION PRECAUTIONS) 30 tablet 0   ibuprofen  (ADVIL ) 800 MG tablet TAKE 1 TABLET (800 MG TOTAL) BY MOUTH 3 (THREE) TIMES DAILY AS NEEDED FOR MODERATE PAIN. 90 tablet 0   metFORMIN  (GLUCOPHAGE ) 500 MG tablet TAKE 1 TABLET BY MOUTH 2 TIMES DAILY WITH A MEAL. 180 tablet 3   methocarbamol  (ROBAXIN ) 500 MG tablet TAKE 1 TABLET (500 MG TOTAL) BY MOUTH 3 (THREE) TIMES DAILY AS NEEDED FOR MUSCLE SPASMS (SEDATION PRECAUTIONS). 40 tablet 0   pantoprazole  (PROTONIX ) 40 MG tablet TAKE 1 TABLET (40 MG TOTAL) BY MOUTH DAILY AS NEEDED (HEARTBURN). 90 tablet 2   rosuvastatin  (CRESTOR ) 20 MG tablet Take 1 tablet (20 mg total) by mouth daily after  supper. 30 tablet 6   sitaGLIPtin  (JANUVIA ) 50 MG tablet Take 1 tablet (50 mg total) by mouth daily. 30 tablet 11   tadalafil  (CIALIS ) 20 MG tablet Take 0.5-1 tablets (10-20 mg total) by mouth every other day as needed for erectile dysfunction. 10 tablet 3   doxycycline  (VIBRA -TABS) 100 MG tablet Take 1 tablet (100 mg total) by mouth daily. (Patient not taking: Reported on 08/31/2023) 60 tablet 0   famotidine  (PEPCID ) 20 MG tablet Take 1 tablet (20 mg total) by mouth 2 (two) times daily. (Patient not taking: Reported on 08/31/2023)     ondansetron  (ZOFRAN -ODT) 4 MG disintegrating tablet Take 1 tablet (4 mg total) by mouth every 6 (six) hours as needed for nausea or vomiting. (Patient not taking: Reported on 08/31/2023) 15 tablet 0   No facility-administered medications prior to visit.      Per HPI unless specifically indicated in ROS section below Review of Systems  Constitutional:  Negative for fatigue and fever.  HENT:  Negative for ear pain.   Eyes:  Negative for pain.  Respiratory:  Negative for cough and shortness of breath.   Cardiovascular:  Negative for chest pain, palpitations and leg swelling.  Gastrointestinal:  Negative for abdominal pain.  Genitourinary:  Negative for dysuria.  Musculoskeletal:  Positive for arthralgias and joint swelling.  Neurological:  Negative for syncope, light-headedness and headaches.  Psychiatric/Behavioral:  Negative for dysphoric mood.    Objective:  BP 134/82   Pulse (!) 111   Temp (!) 97.2 F (36.2 C) (Temporal)   Ht 5' 9.75 (1.772 m)   Wt 188 lb (85.3 kg)   SpO2 96%   BMI 27.17 kg/m   Wt Readings from Last 3 Encounters:  08/31/23 188 lb (85.3 kg)  06/20/23 184 lb 6 oz (83.6 kg)  05/04/23 183 lb (83 kg)      Physical Exam Vitals reviewed.  Constitutional:      Appearance: He is well-developed.  HENT:     Head: Normocephalic.     Right Ear: Hearing normal.     Left Ear: Hearing normal.     Nose: Nose normal.  Neck:     Thyroid : No thyroid  mass or thyromegaly.     Vascular: No carotid bruit.     Trachea: Trachea normal.  Cardiovascular:     Rate and Rhythm: Normal rate and regular rhythm.     Pulses: Normal pulses.     Heart sounds: Heart sounds not distant. No murmur heard.    No friction rub. No gallop.     Comments: No peripheral edema Pulmonary:     Effort: Pulmonary effort is normal. No respiratory distress.     Breath sounds: Normal breath sounds.  Musculoskeletal:     Cervical back: Tenderness present. No spasms or bony tenderness. Pain with movement present. Decreased range of motion.     Thoracic back: Normal.     Comments: Negative Spurling Slightly positive Tinel and Phalen. Negative ulnar neuropathy compression.  Skin:    General: Skin is warm and dry.     Findings: No rash.   Psychiatric:        Speech: Speech normal.        Behavior: Behavior normal.        Thought Content: Thought content normal.       Results for orders placed or performed in visit on 06/20/23  Rubeola antibody IgG   Collection Time: 06/20/23  1:07 PM  Result Value Ref Range   Rubeola  IgG 15.40 (L) AU/mL    Assessment and Plan  Right hand pain Assessment & Plan: Acute, no known injury or change in activity.  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 No clear sign of redness or swelling: Doubt gout as source of pain. Will evaluate with plain x-ray for fracture, osteoarthritis versus erosion etc. Patient does not have any finger triggering and no nodule noted on tendon.  Possible hand strain finger tendinitis. Given the associated numbness we discussed possibility of ulnar neuropathy/cervical radiculopathy but his exam for this was negative.  Encouraged him to continue elevation and icing and ibuprofen  800 mg 3 times a day as needed for pain.  Limit repetitive motion of hand.  Follow-up with PCP if symptoms are continuing after 1 week  Orders: -     DG Hand Complete Right; Future    No follow-ups on file.   Greig Ring, MD

## 2023-08-31 NOTE — Telephone Encounter (Signed)
 FYI Only or Action Required?: FYI only for provider.  Patient was last seen in primary care on 06/20/2023 by Rilla Baller, MD.  Called Nurse Triage reporting Hand Pain.  Symptoms began several days ago.  Interventions attempted: OTC medications: Ibuprofen  (taking every 8 hrs).  Symptoms are: unchanged.  Triage Disposition: See Physician Within 24 Hours             Patient/caregiver understands and will follow disposition?: Yes Reason for Disposition  Weakness (i.e., loss of strength) of new-onset in hand or fingers  (Exceptions: Not truly weak, hand feels weak because of pain; weakness present > 2 weeks)  Answer Assessment - Initial Assessment Questions This RN recommends pt is seen within 24 hours and pt scheduled for an appointment today in office.    ONSET: When did the pain start?    Two days ago  LOCATION: Where is the pain located?   Right palm and right forefinger   PAIN: How bad is the pain? (Scale 1-10; or mild, moderate, severe)     5/10 pain level; can bend finger and move hand, couldn't two days ago  CAUSE: What do you think is causing the pain?     Not sure  OTHER SYMPTOMS: Do you have any other symptoms? (e.g., fever, neck pain, numbness or tingling, rash, swelling)     A little bit of swelling; denies numbness  Protocols used: Hand Pain-A-AH

## 2023-08-31 NOTE — Assessment & Plan Note (Signed)
 Acute, no known injury or change in activity.  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 No clear sign of redness or swelling: Doubt gout as source of pain. Will evaluate with plain x-ray for fracture, osteoarthritis versus erosion etc. Patient does not have any finger triggering and no nodule noted on tendon.  Possible hand strain finger tendinitis. Given the associated numbness we discussed possibility of ulnar neuropathy/cervical radiculopathy but his exam for this was negative.  Encouraged him to continue elevation and icing and ibuprofen  800 mg 3 times a day as needed for pain.  Limit repetitive motion of hand.  Follow-up with PCP if symptoms are continuing after 1 week

## 2023-08-31 NOTE — Telephone Encounter (Signed)
Noted, will see

## 2023-08-31 NOTE — Telephone Encounter (Signed)
 Appointment with Dr. Avelina today at 2:40 pm.

## 2023-09-05 ENCOUNTER — Ambulatory Visit: Payer: Self-pay | Admitting: Family Medicine

## 2023-09-08 ENCOUNTER — Other Ambulatory Visit: Payer: Self-pay | Admitting: Family Medicine

## 2023-09-19 ENCOUNTER — Other Ambulatory Visit: Payer: Self-pay | Admitting: Family Medicine

## 2023-09-19 DIAGNOSIS — M542 Cervicalgia: Secondary | ICD-10-CM

## 2023-09-20 NOTE — Telephone Encounter (Signed)
 Ibuprofen  Last filled:  07/12/23, #90 Last OV:  06/20/23, 4 mo DM f/u Bedsole acute 08/31/23 Next OV:  none

## 2023-09-23 NOTE — Progress Notes (Signed)
 Vessie Olmsted T. Mosiah Bastin, MD, CAQ Sports Medicine [facility] [address] [place], [address]  Phone: [phone]  FAX: [phone]  Gary Long - 53 y.o. male  MRN [id number]  Date of Birth: 08/24/1970  Date: 09/24/2023  PCP: Rilla Baller, MD  Referral: Rilla Baller, MD  Chief Complaint  Patient presents with   Hand Pain    Right hand pain into thumb x 2 weeks    Subjective:   Gary Long is a 53 y.o. very pleasant male patient with Body mass index is 27.02 kg/m. who presents with the following:  Discussed the use of AI scribe software for clinical note transcription with the patient, who gave verbal consent to proceed.  He presents with ongoing hand pain.  He saw Dr. Avelina on August 31, 2023 with some ongoing right sided hand pain.  Radiographs at that time did not show any kind of bony abnormality or fracture. History of Present Illness  He has been experiencing pain in his right hand for approximately one month. Initially, the pain and swelling were localized to the ring finger, which became sore, swollen, and hot, leading to difficulty moving the finger. The pain then moved to another finger and has persisted, affecting the edge of the hand and radiating to the surrounding area.  He has been taking ibuprofen , sometimes 800 mg once or twice a day, but not consistently. He also tried Robaxin  and Voltaren  gel, but these did not provide relief. The pain is exacerbated by certain movements, such as wrapping his thumb and pulling it down, and sometimes the pain extends up the arm.  He denies any history of recent injury, falls, or surgeries to the hand or wrist. He recalls a fracture at the age of 55, but it is not considered relevant to his current condition. 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  No fingers getting stuck or needing to be popped  open.    Review of Systems is noted in the HPI, as appropriate  Objective:   BP (!) 146/94   Pulse 67   Temp (!) 97.5 F (36.4 C) (Temporal)   Ht 5' 9.75 (1.772 m)   Wt 187 lb (84.8 kg)   SpO2 94%   BMI 27.02 kg/m   GEN: No acute distress; alert,appropriate. PULM: Breathing comfortably in no respiratory distress PSYCH: Normally interactive.    EXTR: No clubbing/cyanosis/edema Normal gait  Hand: R Ecchymosis or edema: Mild swelling of the right dorsum of the wrist in the true wrist joint ROM wrist/hand/digits/elbow: full  Carpals, MCP's, digits: First CMC joint is prominent and mildly tender to palpation Distal Ulna and Radius: NT Supination lift test: neg Ecchymosis or edema: neg Cysts/nodules: neg Finkelstein's test: Positive Snuffbox tenderness: neg Scaphoid tubercle: NT Hook of Hamate: NT Resisted supination: NT Full composite fist Grip, all digits: 5/5 str No tenosynovitis Axial load test: neg Phalen's: neg Tinel's: neg Atrophy: neg  Hand sensation: intact   Physical Exam MUSCULOSKELETAL: Fingers not swollen. Arthritic changes in hand. Wrist joint inflamed and swollen. First carpometacarpal joint inflamed with bump. Thumb tendon sheath inflamed.  Laboratory and Imaging Data:  Assessment and Plan:     ICD-10-CM   1. Localized primary osteoarthritis of carpometacarpal (CMC) joint of thumb  M18.10     2. Right hand pain  M79.641     3. De Quervain's tenosynovitis, right  M65.4  Assessment & Plan De Quervain's tenosynovitis and osteoarthritis of right first carpometacarpal joint Right hand and wrist pain with swelling and tenderness in the right first carpometacarpal joint and tendon sheath. No systemic rheumatological condition suspected. Symptoms likely exacerbated by repetitive activities. - Prescribed meloxicam  15 mg orally once daily. - Advised use of a thumb spica splint for 2-3 weeks, especially during work and sleep. - Consider oral  steroids or focal injection if no improvement after 2-3 weeks.  Medication Management during today's office visit: Meds ordered this encounter  Medications   meloxicam  (MOBIC ) 15 MG tablet    Sig: Take 1 tablet (15 mg total) by mouth daily.    Dispense:  30 tablet    Refill:  2   There are no discontinued medications.  Orders placed today for conditions managed today: No orders of the defined types were placed in this encounter.   Disposition: No follow-ups on file.  [employer] One speech-to-text software was used for transcription in this dictation.  Possible transcriptional errors can occur using Animal nutritionist.   Signed,  Jacques DASEN. Jennife Zaucha, MD   Outpatient Encounter Medications as of 09/24/2023  Medication Sig   Accu-Chek FastClix Lancets MISC USE AS DIRECTED TO CHECK BLOOD SUGAR ONCE DAILY   Blood Glucose Monitoring Suppl (ACCU-CHEK GUIDE) w/Device KIT 1 each by Does not apply route as needed. Use as directed to check blood sugar once daily   Cholecalciferol (VITAMIN D3) 1.25 MG (50000 UT) TABS Take 1 tablet by mouth once a week.   dapagliflozin  propanediol (FARXIGA ) 10 MG TABS tablet TAKE 1 TABLET BY MOUTH EVERY DAY   diclofenac  Sodium (VOLTAREN ) 1 % GEL Apply 2 g topically 3 (three) times daily as needed (to affected joint).   famotidine  (PEPCID ) 20 MG tablet Take 1 tablet (20 mg total) by mouth 2 (two) times daily.   gabapentin  (NEURONTIN ) 100 MG capsule Take 1-3 capsules (100-300 mg total) by mouth at bedtime.   glucose blood (ACCU-CHEK GUIDE TEST) test strip USE AS DIRECTED TO CHECK BLOOD SUGAR ONCE DAILY   hydrOXYzine  (ATARAX /VISTARIL ) 25 MG tablet TAKE 1/2 TO 1 TABLET BY MOUTH 3 TIMES A DAY AS NEEDED FOR ITCHING (SEDATION PRECAUTIONS)   ibuprofen  (ADVIL ) 800 MG tablet TAKE 1 TABLET (800 MG TOTAL) BY MOUTH 3 (THREE) TIMES DAILY AS NEEDED FOR MODERATE PAIN.   meloxicam  (MOBIC ) 15 MG tablet Take 1 tablet (15 mg total) by mouth daily.   metFORMIN  (GLUCOPHAGE ) 500 MG  tablet TAKE 1 TABLET BY MOUTH 2 TIMES DAILY WITH A MEAL.   methocarbamol  (ROBAXIN ) 500 MG tablet TAKE 1 TABLET (500 MG TOTAL) BY MOUTH 3 (THREE) TIMES DAILY AS NEEDED FOR MUSCLE SPASMS (SEDATION PRECAUTIONS).   pantoprazole  (PROTONIX ) 40 MG tablet TAKE 1 TABLET (40 MG TOTAL) BY MOUTH DAILY AS NEEDED (HEARTBURN).   rosuvastatin  (CRESTOR ) 20 MG tablet Take 1 tablet (20 mg total) by mouth daily after supper.   sitaGLIPtin  (JANUVIA ) 50 MG tablet Take 1 tablet (50 mg total) by mouth daily.   tadalafil  (CIALIS ) 20 MG tablet Take 0.5-1 tablets (10-20 mg total) by mouth every other day as needed for erectile dysfunction.   doxycycline  (VIBRA -TABS) 100 MG tablet Take 1 tablet (100 mg total) by mouth daily. (Patient not taking: Reported on 09/24/2023)   ondansetron  (ZOFRAN -ODT) 4 MG disintegrating tablet Take 1 tablet (4 mg total) by mouth every 6 (six) hours as needed for nausea or vomiting. (Patient not taking: Reported on 09/24/2023)   No facility-administered encounter medications on file as  of 09/24/2023.

## 2023-09-24 ENCOUNTER — Encounter: Payer: Self-pay | Admitting: Family Medicine

## 2023-09-24 ENCOUNTER — Telehealth: Payer: Self-pay | Admitting: Family Medicine

## 2023-09-24 ENCOUNTER — Ambulatory Visit: Admitting: Family Medicine

## 2023-09-24 VITALS — BP 146/94 | HR 67 | Temp 97.5°F | Ht 69.75 in | Wt 187.0 lb

## 2023-09-24 DIAGNOSIS — M654 Radial styloid tenosynovitis [de Quervain]: Secondary | ICD-10-CM | POA: Diagnosis not present

## 2023-09-24 DIAGNOSIS — E1169 Type 2 diabetes mellitus with other specified complication: Secondary | ICD-10-CM

## 2023-09-24 DIAGNOSIS — M79641 Pain in right hand: Secondary | ICD-10-CM | POA: Diagnosis not present

## 2023-09-24 DIAGNOSIS — M1810 Unilateral primary osteoarthritis of first carpometacarpal joint, unspecified hand: Secondary | ICD-10-CM | POA: Diagnosis not present

## 2023-09-24 MED ORDER — MELOXICAM 15 MG PO TABS: 15.0000 mg | ORAL_TABLET | Freq: Every day | ORAL | 2 refills | Status: AC

## 2023-09-24 NOTE — Patient Instructions (Addendum)
 Wear thumb spica splint for 2-3 weeks  Ice after work  Meloxicam  once a day for 2-3 weeks   Voltaren  1% gel, over the counter You can apply up to 4 times a day  This can be applied to any joint: knee, wrist, fingers, elbows, shoulders, feet and ankles. Can apply to any tendon: tennis elbow, achilles, tendon, rotator cuff or any other tendon.  Minimal is absorbed in the bloodstream: ok with oral anti-inflammatory or a blood thinner.  Cost is about 9 dollars

## 2023-09-24 NOTE — Telephone Encounter (Signed)
 Ok to schedule. I ordered POC A1c.

## 2023-09-24 NOTE — Telephone Encounter (Signed)
 Patient is asking if he can have his labs placed for his A1c.

## 2023-09-25 NOTE — Telephone Encounter (Signed)
 Called patient reviewed all information and repeated back to me. Will call if any questions.  ? ?

## 2023-09-28 ENCOUNTER — Other Ambulatory Visit

## 2023-09-28 DIAGNOSIS — E1169 Type 2 diabetes mellitus with other specified complication: Secondary | ICD-10-CM | POA: Diagnosis not present

## 2023-09-28 LAB — POCT GLYCOSYLATED HEMOGLOBIN (HGB A1C): Hemoglobin A1C: 7.2 % — AB (ref 4.0–5.6)

## 2023-10-01 ENCOUNTER — Ambulatory Visit: Payer: Self-pay | Admitting: Family Medicine

## 2023-10-05 ENCOUNTER — Other Ambulatory Visit: Payer: Self-pay | Admitting: Family Medicine

## 2023-10-20 ENCOUNTER — Other Ambulatory Visit: Payer: Self-pay | Admitting: Family Medicine

## 2023-10-20 DIAGNOSIS — M542 Cervicalgia: Secondary | ICD-10-CM

## 2023-11-25 ENCOUNTER — Emergency Department: Payer: Self-pay

## 2023-11-25 ENCOUNTER — Emergency Department
Admission: EM | Admit: 2023-11-25 | Discharge: 2023-11-25 | Disposition: A | Discharge status: Home / Self Care | Payer: Self-pay | Attending: Emergency Medicine | Admitting: Emergency Medicine

## 2023-11-25 DIAGNOSIS — R0789 Other chest pain: Secondary | ICD-10-CM | POA: Insufficient documentation

## 2023-11-25 DIAGNOSIS — E119 Type 2 diabetes mellitus without complications: Secondary | ICD-10-CM | POA: Diagnosis not present

## 2023-11-25 DIAGNOSIS — R059 Cough, unspecified: Secondary | ICD-10-CM | POA: Diagnosis present

## 2023-11-25 DIAGNOSIS — D696 Thrombocytopenia, unspecified: Secondary | ICD-10-CM | POA: Insufficient documentation

## 2023-11-25 DIAGNOSIS — B349 Viral infection, unspecified: Secondary | ICD-10-CM | POA: Diagnosis not present

## 2023-11-25 LAB — COMPREHENSIVE METABOLIC PANEL WITH GFR
ALT: 21 U/L (ref 0–44)
AST: 23 U/L (ref 15–41)
Albumin: 4 g/dL (ref 3.5–5.0)
Alkaline Phosphatase: 82 U/L (ref 38–126)
Anion gap: 9 (ref 5–15)
BUN: 15 mg/dL (ref 6–20)
CO2: 24 mmol/L (ref 22–32)
Calcium: 8.8 mg/dL — ABNORMAL LOW (ref 8.9–10.3)
Chloride: 105 mmol/L (ref 98–111)
Creatinine, Ser: 1.01 mg/dL (ref 0.61–1.24)
GFR, Estimated: >60 mL/min (ref >60)
Glucose, Bld: 159 mg/dL — ABNORMAL HIGH (ref 70–99)
Potassium: 4.3 mmol/L (ref 3.5–5.1)
Sodium: 138 mmol/L (ref 135–145)
Total Bilirubin: 0.3 mg/dL (ref 0.0–1.2)
Total Protein: 6.6 g/dL (ref 6.5–8.1)

## 2023-11-25 LAB — CBC WITH DIFFERENTIAL/PLATELET
Abs Immature Granulocytes: 0.01 K/uL (ref 0.00–0.07)
Basophils Absolute: 0 K/uL (ref 0.0–0.1)
Basophils Relative: 0 %
Eosinophils Absolute: 0.1 K/uL (ref 0.0–0.5)
Eosinophils Relative: 2 %
HCT: 41 % (ref 39.0–52.0)
Hemoglobin: 13.4 g/dL (ref 13.0–17.0)
Immature Granulocytes: 0 %
Lymphocytes Relative: 31 %
Lymphs Abs: 1.6 K/uL (ref 0.7–4.0)
MCH: 27.5 pg (ref 26.0–34.0)
MCHC: 32.7 g/dL (ref 30.0–36.0)
MCV: 84 fL (ref 80.0–100.0)
Monocytes Absolute: 0.6 K/uL (ref 0.1–1.0)
Monocytes Relative: 11 %
Neutro Abs: 2.8 K/uL (ref 1.7–7.7)
Neutrophils Relative %: 56 %
Platelets: 123 K/uL — ABNORMAL LOW (ref 150–400)
RBC: 4.88 MIL/uL (ref 4.22–5.81)
RDW: 13.6 % (ref 11.5–15.5)
Smear Review: NORMAL
WBC: 5.1 K/uL (ref 4.0–10.5)
nRBC: 0 % (ref 0.0–0.2)

## 2023-11-25 LAB — URINALYSIS, ROUTINE W REFLEX MICROSCOPIC
Bacteria, UA: NONE SEEN
Bilirubin Urine: NEGATIVE
Glucose, UA: >=500 mg/dL — AB
Hgb urine dipstick: NEGATIVE
Ketones, ur: NEGATIVE mg/dL
Leukocytes,Ua: NEGATIVE
Nitrite: NEGATIVE
Protein, ur: NEGATIVE mg/dL
RBC / HPF: 0 RBC/hpf (ref 0–5)
Specific Gravity, Urine: 1.022 (ref 1.005–1.030)
Squamous Epithelial / HPF: 0 /HPF (ref 0–5)
WBC, UA: 0 WBC/hpf (ref 0–5)
pH: 5 (ref 5.0–8.0)

## 2023-11-25 LAB — RESP PANEL BY RT-PCR (RSV, FLU A&B, COVID)  RVPGX2
Influenza A by PCR: NEGATIVE
Influenza B by PCR: NEGATIVE
Resp Syncytial Virus by PCR: NEGATIVE
SARS Coronavirus 2 by RT PCR: NEGATIVE

## 2023-11-25 LAB — GROUP A STREP BY PCR: Group A Strep by PCR: NOT DETECTED

## 2023-11-25 LAB — TROPONIN T, HIGH SENSITIVITY
Troponin T High Sensitivity: <15 ng/L (ref 0–19)
Troponin T High Sensitivity: <15 ng/L (ref 0–19)

## 2023-11-25 LAB — LIPASE, BLOOD: Lipase: 79 U/L — ABNORMAL HIGH (ref 11–51)

## 2023-11-25 MED ORDER — ONDANSETRON HCL 4 MG/2ML IJ SOLN
4.0000 mg | Freq: Once | INTRAMUSCULAR | Status: AC
  Administered 2023-11-25: 4 mg via INTRAVENOUS
  Filled 2023-11-25: qty 2

## 2023-11-25 MED ORDER — DIPHENHYDRAMINE HCL 50 MG/ML IJ SOLN
12.5000 mg | Freq: Once | INTRAMUSCULAR | Status: AC
  Administered 2023-11-25: 12.5 mg via INTRAVENOUS
  Filled 2023-11-25: qty 1

## 2023-11-25 MED ORDER — SODIUM CHLORIDE 0.9 % IV BOLUS
1000.0000 mL | Freq: Once | INTRAVENOUS | Status: AC
  Administered 2023-11-25: 1000 mL via INTRAVENOUS

## 2023-11-25 MED ORDER — KETOROLAC TROMETHAMINE 15 MG/ML IJ SOLN
15.0000 mg | Freq: Once | INTRAMUSCULAR | Status: AC
  Administered 2023-11-25: 15 mg via INTRAVENOUS
  Filled 2023-11-25: qty 1

## 2023-11-25 MED ORDER — ONDANSETRON 4 MG PO TBDP: 4.0000 mg | ORAL_TABLET | Freq: Three times a day (TID) | ORAL | 0 refills | Status: AC | PRN

## 2023-11-25 MED ORDER — BENZONATATE 100 MG PO CAPS: 100.0000 mg | ORAL_CAPSULE | Freq: Three times a day (TID) | ORAL | 0 refills | Status: AC | PRN

## 2023-11-25 MED ORDER — SODIUM CHLORIDE 0.9 % IV BOLUS: 1000.0000 mL | Freq: Once | INTRAVENOUS | Status: DC

## 2023-11-25 MED ORDER — LIDOCAINE 5 % EX PTCH
1.0000 | MEDICATED_PATCH | CUTANEOUS | Status: DC
  Administered 2023-11-25: 1 via TRANSDERMAL
  Filled 2023-11-25: qty 1

## 2023-11-25 MED ORDER — METOCLOPRAMIDE HCL 5 MG/ML IJ SOLN
10.0000 mg | Freq: Once | INTRAMUSCULAR | Status: AC
  Administered 2023-11-25: 10 mg via INTRAVENOUS
  Filled 2023-11-25: qty 2

## 2023-11-25 NOTE — ED Provider Notes (Signed)
 [facility] Provider Note    Event Date/Time   First MD Initiated Contact with Patient 11/25/23 [phone]     (approximate)   History   Cough, Fever, and Chest Pain   HPI  Gary Long is a 53 y.o. male with diabetes, hyperlipidemia who comes in with multiple symptoms.  Patient reports that the main reason he came in was for chest pain.  He reports chest pain that started at midnight.  He reports it is a stabbing sensation and has been about the same in severity.  He does report multiple other symptoms associated with it such as sore throat, congestion, coughing, body pain, head congestion.  He reports taking Tylenol  at 2 AM and taking Mucinex at midnight.  He does report there have been other people that have been sick at his house.  He denies any nausea vomiting or diarrhea.  He denies any shortness of breath, leg swelling.  He reports feeling feverish but he denies taking a temperature.  Denies any travel out of [place] .  He denies any tick bites.  Does report that his wife is sick with similar symptoms but not as severe.  He reports that she has more of just a cough.  He reports having illnesses like this before where he is actually been more sick than he has today.  Physical Exam   Triage Vital Signs: ED Triage Vitals  Encounter Vitals Group     BP 11/25/23 0653 (!) 131/92     Girls Systolic BP Percentile --      Girls Diastolic BP Percentile --      Boys Systolic BP Percentile --      Boys Diastolic BP Percentile --      Pulse Rate 11/25/23 0653 74     Resp 11/25/23 0653 18     Temp 11/25/23 0653 98.1 F (36.7 C)     Temp Source 11/25/23 0724 Oral     SpO2 11/25/23 0653 96 %     Weight --      Height --      Head Circumference --      Peak Flow --      Pain Score 11/25/23 0655 7     Pain Loc --      Pain Education --      Exclude from Growth Chart --     Most recent vital signs: Vitals:   11/25/23 0653 11/25/23 0724  BP: (!)  131/92 (!) 144/97  Pulse: 74 64  Resp: 18 17  Temp: 98.1 F (36.7 C) 98.1 F (36.7 C)  SpO2: 96% 97%     General: Awake, no distress.  CV:  Good peripheral perfusion.  Resp:  Normal effort.  Clear lungs Abd:  No distention.  Soft and nontender Other:  Oropharynx appears normal with normal range of motion of neck.  He reports some sinus pressure in his face.  Lungs sound clear to auscultation.  No obvious CVA tenderness. Cranial nerves appear intact equal strength in arms and legs.  ED Results / Procedures / Treatments   Labs (all labs ordered are listed, but only abnormal results are displayed) Labs Reviewed  CBC WITH DIFFERENTIAL/PLATELET - Abnormal; Notable for the following components:      Result Value   Platelets 123 (*)    All other components within normal limits  COMPREHENSIVE METABOLIC PANEL WITH GFR - Abnormal; Notable for the following components:   Glucose, Bld 159 (*)  Calcium  8.8 (*)    All other components within normal limits  URINALYSIS, ROUTINE W REFLEX MICROSCOPIC - Abnormal; Notable for the following components:   Color, Urine STRAW (*)    APPearance CLEAR (*)    Glucose, UA >=500 (*)    All other components within normal limits  LIPASE, BLOOD - Abnormal; Notable for the following components:   Lipase 79 (*)    All other components within normal limits  RESP PANEL BY RT-PCR (RSV, FLU A&B, COVID)  [id number]  GROUP A STREP BY PCR  TROPONIN T, HIGH SENSITIVITY  TROPONIN T, HIGH SENSITIVITY     EKG  My interpretation of EKG:  Normal sinus rate of 64 without any ST elevation stopped for a little bit in leads II and aVL and V2 that more like early repolarization, no T wave inversions, normal intervals  RADIOLOGY I have reviewed the xray personally and interpreted no evidence of any pneumonia   PROCEDURES:  Critical Care performed: No  .1-3 Lead EKG Interpretation  Performed by: Ernest Ronal BRAVO, MD Authorized by: Ernest Ronal BRAVO, MD      Interpretation: normal     ECG rate:  60   ECG rate assessment: normal     Rhythm: sinus rhythm     Ectopy: none     Conduction: normal      MEDICATIONS ORDERED IN ED: Medications  lidocaine  (LIDODERM ) 5 % 1 patch (1 patch Transdermal Patch Applied 11/25/23 0729)  ketorolac  (TORADOL ) 15 MG/ML injection 15 mg (15 mg Intravenous Given 11/25/23 0729)  sodium chloride  0.9 % bolus 1,000 mL (1,000 mLs Intravenous New Bag/Given 11/25/23 0729)  ondansetron  (ZOFRAN ) injection 4 mg (4 mg Intravenous Given 11/25/23 0728)  metoCLOPramide (REGLAN) injection 10 mg (10 mg Intravenous Given 11/25/23 0831)  diphenhydrAMINE  (BENADRYL ) injection 12.5 mg (12.5 mg Intravenous Given 11/25/23 0830)     IMPRESSION / MDM / ASSESSMENT AND PLAN / ED COURSE  I reviewed the triage vital signs and the nursing notes.   Patient's presentation is most consistent with acute presentation with potential threat to life or bodily function.   Patient comes in with multiple symptoms seeming more consistent with viral illness but will get chest x-ray to evaluate for pneumonia, urine evaluate for any UTI.  Given he does report chest pain will get cardiac markers to evaluate for ACS does not sound consistent with dissection, PE. Will treat patient symptomatically with Toradol , fluids, Zofran , lidocaine  patch while awaiting COVID and flu testing  Lipase was slightly elevated but no upper abdominal pain to suggest pancreatitis, strep test was negative CBC is reassuring slightly low platelets but similar to prior.  CMP was reassuring.  Initial troponin was negative.  8:50 AM  Reevaluated patient abdomen was soft and nontender.  Patient reports still having a headache.  Patient was given migraine cocktail.  His neuroexam was reevaluated and remains normal.  9:59 AM repeat troponin is negative and his pain does not seem consistent with pericarditis.  He reports that symptoms have improved.  He reports just feeling tired now as he  reports having not slept all night.  He is able to stand up and ambulate without any issues.  We discussed that this is still more likely a viral illness we discussed doing CT imaging but at this time he remains soft and nontender and we discussed return precautions in regards to this given his normal abdominal exam at this time.  We discussed symptomatic treatment and return to the ER for worsening  symptoms or any other concern  The patient is on the cardiac monitor to evaluate for evidence of arrhythmia and/or significant heart rate changes.      FINAL CLINICAL IMPRESSION(S) / ED DIAGNOSES   Final diagnoses:  Viral illness     Rx / DC Orders   ED Discharge Orders          Ordered    ondansetron  (ZOFRAN -ODT) 4 MG disintegrating tablet  Every 8 hours PRN        11/25/23 1001    benzonatate  (TESSALON ) 100 MG capsule  3 times daily PRN        11/25/23 1001             Note:  This document was prepared using Dragon voice recognition software and may include unintentional dictation errors.   Ernest Ronal BRAVO, MD 11/25/23 [phone]

## 2023-11-25 NOTE — ED Triage Notes (Signed)
 Pt to ED with c/o fever, body pain, cough and sore throat that began yesterday. Reports sick contacts. Pain currently 7/10, has not taken any OTC medications. Denies n/v/d.

## 2023-11-25 NOTE — ED Notes (Signed)
 MD at bedside. Md stating pt's chief complaint is chest pain. Triage updated.

## 2023-11-25 NOTE — Discharge Instructions (Addendum)
 I suspect this is most likely a viral illness especially given your wife's been sick too.  He can take Tylenol  1 g every 8 hours and ibuprofen  600 every 8 hours with food to help with any pain or bodyaches.  If you develop worsening symptoms pain in your abdomen or any other concerns please return to the ER for repeat evaluation.

## 2023-12-13 ENCOUNTER — Ambulatory Visit
Admission: RE | Admit: 2023-12-13 | Discharge: 2023-12-13 | Disposition: A | Source: Ambulatory Visit | Attending: Nurse Practitioner

## 2023-12-13 ENCOUNTER — Ambulatory Visit: Payer: Self-pay | Admitting: Nurse Practitioner

## 2023-12-13 VITALS — BP 122/80 | HR 69 | Temp 98.1°F | Ht 69.75 in | Wt 189.2 lb

## 2023-12-13 DIAGNOSIS — M25511 Pain in right shoulder: Secondary | ICD-10-CM

## 2023-12-13 MED ORDER — METHYLPREDNISOLONE ACETATE 40 MG/ML IJ SUSP
40.0000 mg | Freq: Once | INTRAMUSCULAR | Status: AC
  Administered 2023-12-13: 40 mg via INTRAMUSCULAR

## 2023-12-13 NOTE — Patient Instructions (Signed)
 Nice to see you today  I will be in touch with the xray once I have reviewed them  Follow up if you do not improve  We did give you a dose of steroids in the muscle today that will increase your blood sugar

## 2023-12-13 NOTE — Progress Notes (Signed)
 Established Patient Office Visit  Subjective   Patient ID: Gary Long, male    DOB: 03-25-1970  Age: 53 y.o. MRN: [id number]  Chief Complaint  Patient presents with   Shoulder Pain    Pt complains of having Right clavicle pain that started a week ago. Pain is sharp and constant. Has taken OTC but none today.        Discussed the use of AI scribe software for clinical note transcription with the patient, who gave verbal consent to proceed.  History of Present Illness Gary Long is a 53 year old male with diabetes who presents with right clavicle pain.  He has been experiencing a deep, burning pain in the right clavicle for the past week, which radiates to the shoulder joint. The pain affects his sleep. He initially thought it was related to his neck due to a previous experience with neck pain from using a hotel pillow, but he noticed this pain is different. He has tried ibuprofen  800 mg, Robaxin , and gabapentin  without relief. His wife has massaged the area, resulting in soreness but no alleviation of the burning pain.  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 He denies any other injuries or incidents that could have caused the pain. There is no numbness or tingling in the arm, but he mentions a feeling of weakness.  During the review of symptoms, he reports good circulation with no coldness in the affected area. He experiences tightness in the neck muscles, but this feels different from previous episodes. He also notes tightness in the shoulder blade area, but no significant pain there.  He is right-handed and works as a designer, jewellery, which involves physical activity. His current medications include Farxiga , Pepcid , gabapentin  (1-2 tablets at bedtime), hydroxyzine , ibuprofen , metformin , Robaxin , Protonix , rosuvastatin , sitagliptin , and tadalafil  as needed. He monitors his blood sugar every three days  and notes that his last A1c was 7.2%.     Review of Systems  Musculoskeletal:  Positive for joint pain.  Neurological:  Positive for weakness. Negative for tingling.      Objective:     BP 122/80   Pulse 69   Temp 98.1 F (36.7 C) (Oral)   Ht 5' 9.75 (1.772 m)   Wt 189 lb 3.2 oz (85.8 kg)   SpO2 93%   BMI 27.34 kg/m    Physical Exam Vitals and nursing note reviewed.  Constitutional:      Appearance: Normal appearance.  Cardiovascular:     Rate and Rhythm: Normal rate and regular rhythm.     Pulses:          Radial pulses are 2+ on the right side and 2+ on the left side.     Heart sounds: Normal heart sounds.  Pulmonary:     Effort: Pulmonary effort is normal.     Breath sounds: Normal breath sounds.  Musculoskeletal:        General: Tenderness present.     Right shoulder: Tenderness and bony tenderness present. Decreased range of motion. Normal strength. Normal pulse.       Arms:     Comments: Negative Empty can test and Vonzell Berber test   Bilateral upper extremity strength 5/5 Bicep tendon 2+ bilaterally   Neurological:     General: No focal deficit present.     Mental Status: He is alert.      No results found for any visits  on 12/13/23.    The 10-year ASCVD risk score (Arnett DK, et al., 2019) is: 9.7%    Assessment & Plan:   Problem List Items Addressed This Visit   None Visit Diagnoses       Acute pain of right shoulder    -  Primary   Relevant Medications   methylPREDNISolone  acetate (DEPO-MEDROL ) injection 40 mg (Completed)   Other Relevant Orders   DG Shoulder Right   DG Clavicle Right      Assessment and Plan Assessment & Plan Right clavicular and shoulder pain Acute pain with burning sensation, worsened by movement. Possible musculoskeletal strain or injury. Recent CPR activity may be a factor. - Ordered x-ray of right clavicle and shoulder. - Administered Depo Medrol  injection 40mg  IM x 1 . - Advised anti-inflammatories  and Tylenol  for pain. - Provided work note to avoid lifting or pushing over 10 pounds.  Type 2 diabetes mellitus Recent A1c of 7.2. Steroid use may increase blood glucose, but he agreed to manage with medications. - Continue current diabetes medications.  Return if symptoms worsen or fail to improve.    Adina Crandall, NP

## 2023-12-19 ENCOUNTER — Ambulatory Visit: Payer: Self-pay | Admitting: Nurse Practitioner

## 2023-12-21 ENCOUNTER — Telehealth: Payer: Self-pay | Admitting: Family Medicine

## 2023-12-21 MED ORDER — SITAGLIPTIN 50 MG PO TABS: 50.0000 mg | ORAL_TABLET | Freq: Every day | ORAL | 3 refills | Status: DC

## 2023-12-21 NOTE — Telephone Encounter (Signed)
 Please notify I received message from his insurance pharmacy [facility] that as of 01/10/2024 insurance will no longer cover Januvia . They will however cover Zituvio  which is a different brand of the same medicine. Therefore I have sent Zituvio  in to [facility] to fill after 01/10/2024.

## 2023-12-21 NOTE — Telephone Encounter (Signed)
 Called patient he states he will hold off on change he gets new insurance first of the year and if Januvia  is covered under that plan he would like to stay on it. Advised that the new script was sent to pharmacy he will reach out and let them know not to fill at this time. He will call office with update on what he is able to get filled first of the year.

## 2023-12-28 ENCOUNTER — Other Ambulatory Visit: Payer: Self-pay | Admitting: Family Medicine

## 2023-12-28 DIAGNOSIS — E559 Vitamin D deficiency, unspecified: Secondary | ICD-10-CM

## 2023-12-28 NOTE — Telephone Encounter (Signed)
 E-scribed refill.  Pls schedule CPE and fasting labs for additional refills.

## 2023-12-31 NOTE — Telephone Encounter (Signed)
 Patient states he will call to schedule lab

## 2024-01-06 ENCOUNTER — Other Ambulatory Visit: Payer: Self-pay | Admitting: Family Medicine

## 2024-01-06 DIAGNOSIS — K219 Gastro-esophageal reflux disease without esophagitis: Secondary | ICD-10-CM

## 2024-01-20 ENCOUNTER — Other Ambulatory Visit: Payer: Self-pay

## 2024-01-20 ENCOUNTER — Emergency Department

## 2024-01-20 ENCOUNTER — Emergency Department
Admission: EM | Admit: 2024-01-20 | Discharge: 2024-01-20 | Disposition: A | Discharge status: Home / Self Care | Attending: Emergency Medicine | Admitting: Emergency Medicine

## 2024-01-20 DIAGNOSIS — I10 Essential (primary) hypertension: Secondary | ICD-10-CM | POA: Insufficient documentation

## 2024-01-20 DIAGNOSIS — M25511 Pain in right shoulder: Secondary | ICD-10-CM | POA: Diagnosis present

## 2024-01-20 DIAGNOSIS — E119 Type 2 diabetes mellitus without complications: Secondary | ICD-10-CM | POA: Diagnosis not present

## 2024-01-20 DIAGNOSIS — M5412 Radiculopathy, cervical region: Secondary | ICD-10-CM | POA: Diagnosis not present

## 2024-01-20 DIAGNOSIS — Z8616 Personal history of COVID-19: Secondary | ICD-10-CM | POA: Insufficient documentation

## 2024-01-20 DIAGNOSIS — M62838 Other muscle spasm: Secondary | ICD-10-CM | POA: Diagnosis not present

## 2024-01-20 LAB — CBC
HCT: 42 % (ref 39.0–52.0)
Hemoglobin: 13.6 g/dL (ref 13.0–17.0)
MCH: 27.9 pg (ref 26.0–34.0)
MCHC: 32.4 g/dL (ref 30.0–36.0)
MCV: 86.2 fL (ref 80.0–100.0)
Platelets: 138 K/uL — ABNORMAL LOW (ref 150–400)
RBC: 4.87 MIL/uL (ref 4.22–5.81)
RDW: 13.3 % (ref 11.5–15.5)
WBC: 6.2 K/uL (ref 4.0–10.5)
nRBC: 0 % (ref 0.0–0.2)

## 2024-01-20 LAB — BASIC METABOLIC PANEL WITH GFR
Anion gap: 10 (ref 5–15)
BUN: 19 mg/dL (ref 6–20)
CO2: 22 mmol/L (ref 22–32)
Calcium: 9.4 mg/dL (ref 8.9–10.3)
Chloride: 102 mmol/L (ref 98–111)
Creatinine, Ser: 1.09 mg/dL (ref 0.61–1.24)
GFR, Estimated: >60 mL/min
Glucose, Bld: 245 mg/dL — ABNORMAL HIGH (ref 70–99)
Potassium: 3.8 mmol/L (ref 3.5–5.1)
Sodium: 134 mmol/L — ABNORMAL LOW (ref 135–145)

## 2024-01-20 LAB — TROPONIN T, HIGH SENSITIVITY: Troponin T High Sensitivity: <15 ng/L (ref 0–19)

## 2024-01-20 MED ORDER — LIDOCAINE 5 % EX PTCH: 1.0000 | MEDICATED_PATCH | CUTANEOUS | 0 refills | Status: AC

## 2024-01-20 MED ORDER — DIAZEPAM 5 MG PO TABS
10.0000 mg | ORAL_TABLET | Freq: Once | ORAL | Status: AC
  Administered 2024-01-20: 10 mg via ORAL
  Filled 2024-01-20: qty 2

## 2024-01-20 MED ORDER — DIAZEPAM 5 MG PO TABS: 5.0000 mg | ORAL_TABLET | Freq: Three times a day (TID) | ORAL | 0 refills | Status: AC | PRN

## 2024-01-20 MED ORDER — LIDOCAINE 5 % EX PTCH
2.0000 | MEDICATED_PATCH | CUTANEOUS | Status: DC
  Administered 2024-01-20: 2 via TRANSDERMAL
  Filled 2024-01-20: qty 2

## 2024-01-20 MED ORDER — NAPROXEN 500 MG PO TABS
500.0000 mg | ORAL_TABLET | Freq: Once | ORAL | Status: AC
  Administered 2024-01-20: 500 mg via ORAL
  Filled 2024-01-20: qty 1

## 2024-01-20 MED ORDER — NAPROXEN 500 MG PO TABS: 500.0000 mg | ORAL_TABLET | Freq: Two times a day (BID) | ORAL | 0 refills | Status: AC

## 2024-01-20 NOTE — ED Provider Notes (Signed)
 "  [facility] Provider Note    Event Date/Time   First MD Initiated Contact with Patient 01/20/24 1936     (approximate)   History   Chief Complaint: Chest Pain   HPI  Gary Long is a 54 y.o. male with a history of hypertension, GERD, diabetes who comes ED complaining of right shoulder pain for the past month.  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  After that strenuous activity he started having shoulder pain which has been persistent and not relieved over the past month with tizanidine, gabapentin , and other medications.  Did get some temporary relief with Toradol .  Has seen his primary care doctor, has been referred to sports medicine and has an appointment with them in 3 days.   Denies chest pain shortness of breath.  No motor weakness or paresthesias.  No headache or vision changes.     Past Medical History:  Diagnosis Date   COVID-19 virus infection 11/28/2022   Diabetes mellitus type 2, controlled, without complications (HCC) 11/27/2013   History of malaria 1999   HLD (hyperlipidemia) 06/13/2013   Hyperlipidemia     Current Outpatient Rx   Order #: [id number] Class: Normal   Order #: [id number] Class: Normal   Order #: [id number] Class: Normal   Order #: [id number] Class: Normal   Order #: [id number] Class: Normal   Order #: [id number] Class: Normal   Order #: [id number] Class: Normal   Order #: [id number] Class: Normal   Order #: [id number] Class: Normal   Order #: [id number] Class: OTC   Order #: [id number] Class: Normal   Order #: [id number] Class: Normal   Order #: [id number] Class: Normal   Order #: [id number] Class: Normal   Order #: [id number] Class: Normal   Order #: [id number] Class: Normal   Order #: [id number] Class: Normal   Order #: [id number] Class: Normal   Order #: [id number] Class: Normal   Order #: [id number] Class: Normal   Order #: [id number] Class: Normal   Order #: [id number] Class: Normal     Past Surgical History:  Procedure Laterality Date   COLONOSCOPY  02/2021   WNL, done for pos iFOB, rpt 10 yrs ([facility])   KNEE ARTHROSCOPY Right 01/10/2000   ?ACL repair   KNEE ARTHROSCOPY WITH DRILLING/MICROFRACTURE Right 06/21/2016   Procedure: KNEE ARTHROSCOPY WITH DRILLING/MICROFRACTURE;  Surgeon: Jerri Kay HERO, MD;  Location: [facility];  Service: Orthopedics;  Laterality: Right;   KNEE ARTHROSCOPY WITH MEDIAL MENISECTOMY Right 06/21/2016   Procedure: RIGHT KNEE ARTHROSCOPY WITH PARTIAL MEDIAL AND LATERAL MENISCECTOMIES, SYNOVECTOMY,  MICROFRACTURE, SUBCHONDROPLASTY;  Surgeon: Jerri Kay HERO, MD;  Location: [facility];  Service: Orthopedics;  Laterality: Right;   KNEE ARTHROSCOPY WITH SUBCHONDROPLASTY Right 06/21/2016   Procedure: KNEE ARTHROSCOPY WITH SUBCHONDROPLASTY;  Surgeon: Jerri Kay HERO, MD;  Location: [facility];  Service: Orthopedics;  Laterality: Right;    Physical Exam   Triage Vital Signs: ED Triage Vitals  Encounter Vitals Group     BP 01/20/24 1711 (!) 133/91     Girls Systolic BP Percentile --      Girls Diastolic BP Percentile --      Boys Systolic BP Percentile --      Boys Diastolic BP Percentile --      Pulse Rate 01/20/24 1711 95     Resp 01/20/24 1711 18     Temp 01/20/24 1711 98.4 F (36.9 C)     Temp src --      SpO2 01/20/24 1711 94 %  Weight 01/20/24 1712 185 lb (83.9 kg)     Height 01/20/24 1712 5' 9 (1.753 m)     Head Circumference --      Peak Flow --      Pain Score --      Pain Loc --      Pain Education --      Exclude from Growth Chart --     Most recent vital signs: Vitals:   01/20/24 2230 01/20/24 2300  BP: 113/86 120/84  Pulse: 70 73  Resp:    Temp:  97.8 F (36.6 C)  SpO2: 98% 99%    General: Awake, no distress.  CV:  Good peripheral perfusion.  Regular rate rhythm, normal distal pulses Resp:  Normal effort.  Clear to auscultation bilaterally Abd:  No distention.   Other:  Muscle tenseness and tenderness over the superior trapezius and sternocleidomastoid.  No inflammatory skin changes or swelling.  Full range of motion of the shoulder.  No bony point tenderness or deformity.  Neuro intact.  Spurling test positive.   ED Results / Procedures / Treatments   Labs (all labs ordered are listed, but only abnormal results are displayed) Labs Reviewed  BASIC METABOLIC PANEL WITH GFR - Abnormal; Notable for the following components:      Result Value   Sodium 134 (*)    Glucose, Bld 245 (*)    All other components within normal limits  CBC - Abnormal; Notable for the following components:   Platelets 138 (*)    All other components within normal limits  TROPONIN T, HIGH SENSITIVITY     EKG Interpreted by me Normal sinus rhythm rate of 89.  Normal axis, normal intervals.  Normal QRS ST segments T waves.  No acute ischemic changes.   RADIOLOGY Chest x-ray interpreted by me, unremarkable.  Radiology report reviewed   PROCEDURES:  Procedures   MEDICATIONS ORDERED IN ED: Medications  lidocaine  (LIDODERM ) 5 % 2 patch (2 patches Transdermal Patch Applied 01/20/24 2027)  naproxen  (NAPROSYN ) tablet 500 mg (500 mg Oral Given 01/20/24 2026)  diazepam  (VALIUM ) tablet 10 mg (10 mg Oral Given 01/20/24 2026)     IMPRESSION / MDM / ASSESSMENT AND PLAN / ED COURSE  I reviewed the triage vital signs and the nursing notes.  DDx: Musculoskeletal shoulder pain, cervical radiculopathy, pneumothorax, NSTEMI -----   Patient's presentation is most consistent with acute presentation with potential threat to life or bodily function.  Patient presents with musculoskeletal shoulder pain after traumatic episode.  Highly reproducible on exam.  Doubt ACS PE dissection.  With the chronicity of his symptoms, lab workup and chest x-ray obtained which are unremarkable, EKG reassuring.   Clinical Course as of 01/20/24 2348  [place] Jan 20, 2024  2201 Sleeping.  X-ray  negative.  Stable for discharge [PS]    Clinical Course User Index [PS] Viviann Pastor, MD     FINAL CLINICAL IMPRESSION(S) / ED DIAGNOSES   Final diagnoses:  Cervical radiculopathy  Muscle spasm     Rx / DC Orders   ED Discharge Orders          Ordered    naproxen  (NAPROSYN ) 500 MG tablet  2 times daily with meals        01/20/24 2246    diazepam  (VALIUM ) 5 MG tablet  Every 8 hours PRN        01/20/24 2246    lidocaine  (LIDODERM ) 5 %  Every 24 hours  01/20/24 2246             Note:  This document was prepared using Dragon voice recognition software and may include unintentional dictation errors.   Viviann Pastor, MD 01/20/24 2348  "

## 2024-01-20 NOTE — ED Triage Notes (Signed)
 Pt comes with cp and shoulder pain. Pt states thi started this moring. Pt states right sided neck and chest pain. Pt denies any sob, N/V.

## 2024-01-22 ENCOUNTER — Encounter: Payer: Self-pay | Admitting: Family Medicine

## 2024-01-22 NOTE — Progress Notes (Addendum)
 "    Gary Goldston T. Johntay Doolen, MD, CAQ Sports Medicine [facility] at [facility] [address] [place], [address]  Phone: [phone]  FAX: [phone]  Gary Long - 54 y.o. male  MRN [id number]  Date of Birth: 12-Jan-1970  Date: 01/23/2024  PCP: Rilla Baller, MD  Referral: Rilla Baller, MD  Chief Complaint  Patient presents with   Neck Pain    ED 01/20/24   Subjective:   Gary Long is a 54 y.o. very pleasant male patient with Body mass index is 27.53 kg/m. who presents with the following:  Discussed the use of AI scribe software for clinical note transcription with the patient, who gave verbal consent to proceed.  Very pleasant gentleman who presents with some ongoing shoulder and neck pain for about 6 weeks or more.  No history of shoulder or spine surgery.  He saw Matt cable NP on December 13, 2023.  Health at that point to be predominant shoulder pain, he was given Depo-Medrol  40 mg injection, NSAIDs, and work modifications.  01/20/2024 ER visit, they felt like he had musculoskeletal shoulder pain and ongoing neck pain.  He has tried multiple different medications including Naprosyn , Valium , lidocaine  patch, intramuscular steroids. History of Present Illness Gary Long is a 54 year old male who presents with shoulder pain following CPR performed at work.  He has been experiencing shoulder pain that began a few days after performing CPR at work approximately six to seven weeks ago. The pain is described as a pinching sensation, primarily located in the shoulder region, with associated pain to a lesser degree in the posterior neck and in the parascapular musculature. It is sharp and deep, with associated weakness but no numbness, tingling, or burning sensations.  There is no radiating pain down the arm.  He does have pain in a T-shirt distribution of the right arm He is having difficulty weakness in the plane of  abduction and flexion, and he is also pain with rotating the shoulder  He has a history of neck pain, typically triggered by sleeping on a bad pillow, but states that this current pain is different and more severe. He has not experienced previous shoulder injuries, broken bones, dislocations, or surgeries.  He initially sought treatment at urgent care where he received a Toradol  shot and was prescribed muscle relaxers and lidocaine , which did not alleviate the pain. He also tried ibuprofen  and was given Valium  and lidocaine , but these have not provided relief. He has not yet engaged in physical therapy.  He has informed his supervisor about the incident but has not filed a worker's compensation claim yet.  I independently reviewed the patient's radiographs from December 13, 2023, and there is no significant acute findings.  No evidence for fracture or dislocation.  The glenohumeral joint is well-preserved.  Minor degeneration of the [facility] joint. Electronically Signed  By: Jacques Schroeder, MD On: 01/23/2024 12:00 PM EST   Review of Systems is noted in the HPI, as appropriate  Patient Active Problem List   Diagnosis Date Noted   Right hand pain 08/31/2023   Encounter for health counseling related to travel 06/20/2023   Polyarthralgia 05/04/2023   Diabetic neuropathy, type II diabetes mellitus (HCC) 04/30/2023   Foot pain, bilateral 04/30/2023   Epigastric pain 04/20/2023   Left temporal headache 11/28/2022   Neck pain, musculoskeletal 07/12/2022   Chronic right shoulder pain 12/09/2021   Vitamin D  deficiency 11/27/2021   Thrombocytopenia 11/17/2020   Pulmonary  nodule 10/22/2020   ED (erectile dysfunction) 05/28/2020   Headache 11/28/2019   Seasonal allergic rhinitis 03/29/2018   GERD (gastroesophageal reflux disease) 10/24/2016   Complex tear of medial meniscus of right knee as current injury 06/15/2016   Unilateral primary osteoarthritis, right knee 06/15/2016   Palpitations 05/10/2016    Transaminitis 05/10/2016   Health maintenance examination 11/27/2013   Type 2 diabetes mellitus with other specified complication (HCC) 11/27/2013   Lateral meniscal tear 11/27/2013   Hyperlipidemia associated with type 2 diabetes mellitus (HCC) 06/13/2013    Past Medical History:  Diagnosis Date   COVID-19 virus infection 11/28/2022   Diabetes mellitus type 2, controlled, without complications (HCC) 11/27/2013   History of malaria 1999   HLD (hyperlipidemia) 06/13/2013   Hyperlipidemia     Past Surgical History:  Procedure Laterality Date   COLONOSCOPY  02/2021   WNL, done for pos iFOB, rpt 10 yrs ([facility])   KNEE ARTHROSCOPY Right 01/10/2000   ?ACL repair   KNEE ARTHROSCOPY WITH DRILLING/MICROFRACTURE Right 06/21/2016   Procedure: KNEE ARTHROSCOPY WITH DRILLING/MICROFRACTURE;  Surgeon: Jerri Kay HERO, MD;  Location: [facility];  Service: Orthopedics;  Laterality: Right;   KNEE ARTHROSCOPY WITH MEDIAL MENISECTOMY Right 06/21/2016   Procedure: RIGHT KNEE ARTHROSCOPY WITH PARTIAL MEDIAL AND LATERAL MENISCECTOMIES, SYNOVECTOMY,  MICROFRACTURE, SUBCHONDROPLASTY;  Surgeon: Jerri Kay HERO, MD;  Location: [facility];  Service: Orthopedics;  Laterality: Right;   KNEE ARTHROSCOPY WITH SUBCHONDROPLASTY Right 06/21/2016   Procedure: KNEE ARTHROSCOPY WITH SUBCHONDROPLASTY;  Surgeon: Jerri Kay HERO, MD;  Location: [facility];  Service: Orthopedics;  Laterality: Right;    Family History  Problem Relation Age of Onset   Stroke Mother 55   Diabetes Mother 57   Hypertension Sister    Diabetes Sister    Cancer Neg Hx    CAD Neg Hx    Colon cancer Neg Hx    Esophageal cancer Neg Hx    Stomach cancer Neg Hx    Rectal cancer Neg Hx     Social History   Social History Narrative   Caffeine: occasionally   Lives with wife and 3 daughters   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[place]    Activity: gym    Diet: good water, fruits/vegetables daily   From  [place]     Objective:   BP 118/78   Pulse 84   Temp 98.2 F (36.8 C) (Oral)   Ht 5' 9.75 (1.772 m)   Wt 190 lb 8 oz (86.4 kg)   SpO2 94%   BMI 27.53 kg/m   GEN: No acute distress; alert,appropriate. PULM: Breathing comfortably in no respiratory distress PSYCH: Normally interactive.    CERVICAL SPINE EXAM Range of motion: Flexion, extension, lateral bending, and rotation: Mild, approximate 10% loss of motion Spurling's: Negative Pain with terminal motion: Yes Spinous Processes: NT SCM: NT Upper paracervical muscles: Posterior, occipital, mild pain Upper traps: Notable significant pain on the right throughout the entirety of the parascapular musculature and the upper through lower trap C5-T1 intact, sensation and motor    Shoulder: Right Inspection: No muscle wasting or winging Ecchymosis/edema: neg  AC joint, scapula, clavicle: Mild tenderness at the [facility] joint Significant tenderness in the bicipital groove Abduction: Significant guarding, and unable to passively move the shoulder beyond 145 degrees, 4 /5 Flexion: Significant guarding, I am unable to passively flex the shoulder beyond 145 degrees, 5/5 IR, full, lift-off: 5/5 ER at neutral: full, 5/5 AC crossover: Positive Neer: pos  Hawkins: pos Drop Test: neg Jobe: pos Supraspinatus insertion: mild-mod T Bicipital groove: Tender to palpation Speed's: Positive Yergason's: neg Sulcus sign: neg Scapular dyskinesis: Parascapular tenderness  Laboratory and Imaging Data: EXAM: 1 VIEW(S) XRAY OF THE SHOULDER 12/13/2023 09:59:41 AM   COMPARISON: None available.   CLINICAL HISTORY: shoulder pain/decreased ROM.   FINDINGS:   BONES AND JOINTS: Glenohumeral joint is normally aligned. No acute fracture. No malalignment. The [facility] joint is unremarkable.   SOFT TISSUES: No abnormal calcifications. Visualized lung is unremarkable.   IMPRESSION: 1. No acute osseous abnormality or malalignment identified to account  for the reported symptoms.   Electronically signed by: Morgane Naveau MD 12/19/2023 03:17 AM EST RP Workstation: [id number]  EXAM: 2 VIEW(S) XRAY OF THE CLAVICLE COMPLETE 12/13/2023 09:59:41 AM   COMPARISON: None available.   CLINICAL HISTORY: distal clavicle pain/ decreased ROM   FINDINGS:   BONES: No acute fracture. No malalignment.   JOINTS: Mild degenerative changes of the acromioclavicular joint.   SOFT TISSUES: The soft tissues are unremarkable.   IMPRESSION: 1. No acute findings.   Electronically signed by: Morgane Naveau MD 12/19/2023 03:17 AM EST RP Workstation: [id number]  Assessment and Plan:     ICD-10-CM   1. Rotator cuff strain, right, initial encounter  S46.011A Ambulatory referral to Physical Therapy    2. Cervicalgia  M54.2 Ambulatory referral to Physical Therapy    MR Cervical Spine Wo Contrast    Ambulatory referral to Orthopedic Surgery    DG Cervical Spine Complete    3. Acute pain of right shoulder  M25.511 Ambulatory referral to Physical Therapy    triamcinolone  acetonide (KENALOG -40) injection 40 mg     Total encounter time: 32 minutes. This includes total time spent on the day of encounter.  Includes independent x-ray review, review of the case and records, and discussion with the patient. Assessment & Plan Right rotator cuff strain Likely due to CPR compressions. Imaging shows no significant joint abnormalities. Previous treatments ineffective. The patient gives a good history for injury in the workplace while doing compressions - Administered subacromial shoulder injection with lidocaine  and Kenalog . - Referred to physical therapy for shoulder rehabilitation. - Advised gentle range of motion exercises at home, avoiding significant pain. I gave him rehab from the [facility] to work on at home -It does sound like he was injured in the workplace, and I recommended that he file this with his employer and for his  protection as a Teacher, Adult Education. Claim - Employer may direct care to their preferred Worker's Comp. physician  Cervicalgia Likely secondary to referred pain from right shoulder strain.  I think that the primary issue is the shoulder - Certainly does have some tenderness in the posterior neck as well as in the parascapular musculature. - Included neck exercises in physical therapy regimen. - Advised gentle neck movements to avoid exacerbating shoulder pain.  Addendum: 01/28/24 10:25 AM  The patient has contacted me a few times, and he is concerned about his persistent neck pain.  His neck pain and shoulder pain are concentrated on the right.  Failed to improve with conservative treatment including intramuscular Depo-Medrol , right sided subacromial Kenalog  injection, Toradol  injection, Naprosyn , Zanaflex, gabapentin , as well as Valium . - Obtain an MRI of the cervical spine to evaluate for herniated nucleus pulposus, foraminal stenosis, spinal stenosis. - We will also need to obtain cervical spine x-rays prior to the patient's MRI.  I am going to include a direct quote from the patient from  a MyChart message dated January 25, 2024. Im writing because my neck pain has not improved and is now significantly affecting my sleep and daily function. To summarize: Pain began after performing CPR about a month ago Pain radiates between my shoulder, clavicle, and neck Neck rotation (especially to the left) and overhead arm movement worsen the pain I am having sleepless nights due to sharp, pinching neck pain and feel weak from lack of rest I have no numbness or tingling Treatments that have not helped: Deltoid steroid injection (Depo) Subacromial Kenalog  injection (2 days ago) Ketorolac  injection Naproxen  500 mg Valium , Zanaflex Gabapentin   Aspiration/Injection Procedure Note Gary Long 11/30/1970 Date of procedure: 01/23/2024  Procedure: Large Joint Aspiration / Injection of  Shoulder, Subacromial, R Indications: Pain  Procedure Details Verbal consent was obtained from the patient. Risks, benefits, and alternatives were explained. Patient prepped with Chloraprep and Ethyl Chloride used for anesthesia. The subacromial space was injected using the posterior approach. The patient tolerated the procedure well and had decreased pain post injection. No complications. Injection: 9 cc of Lidocaine  1% and 1 mL of Kenalog  40 mg. Needle: 22 gauge Medication: 1 mL of Kenalog  40 mg   Medication Management during today's office visit: Meds ordered this encounter  Medications   triamcinolone  acetonide (KENALOG -40) injection 40 mg     Orders placed today for conditions managed today: Orders Placed This Encounter  Procedures   MR Cervical Spine Wo Contrast   DG Cervical Spine Complete   Ambulatory referral to Physical Therapy   Ambulatory referral to Orthopedic Surgery    Disposition: No follow-ups on file.  [employer] One speech-to-text software was used for transcription in this dictation.  Possible transcriptional errors can occur using Animal nutritionist.   Signed,  Jacques DASEN. Armya Westerhoff, MD   Outpatient Encounter Medications as of 01/23/2024  Medication Sig   Accu-Chek FastClix Lancets MISC USE AS DIRECTED TO CHECK BLOOD SUGAR ONCE DAILY   Blood Glucose Monitoring Suppl (ACCU-CHEK GUIDE) w/Device KIT 1 each by Does not apply route as needed. Use as directed to check blood sugar once daily   Cholecalciferol (VITAMIN D3) 1.25 MG (50000 UT) CAPS TAKE 1 CAPSULE BY MOUTH ONCE A WEEK   dapagliflozin  propanediol (FARXIGA ) 10 MG TABS tablet TAKE 1 TABLET BY MOUTH EVERY DAY   diazepam  (VALIUM ) 5 MG tablet Take 1 tablet (5 mg total) by mouth every 8 (eight) hours as needed for muscle spasms.   diclofenac  Sodium (VOLTAREN ) 1 % GEL Apply 2 g topically 3 (three) times daily as needed (to affected joint).   doxycycline  (VIBRA -TABS) 100 MG tablet Take 1 tablet (100 mg total)  by mouth daily.   famotidine  (PEPCID ) 20 MG tablet Take 1 tablet (20 mg total) by mouth 2 (two) times daily.   gabapentin  (NEURONTIN ) 100 MG capsule TAKE 1-3 CAPSULES (100-300 MG TOTAL) BY MOUTH AT BEDTIME.   glucose blood (ACCU-CHEK GUIDE TEST) test strip USE AS DIRECTED TO CHECK BLOOD SUGAR ONCE DAILY   hydrOXYzine  (ATARAX /VISTARIL ) 25 MG tablet TAKE 1/2 TO 1 TABLET BY MOUTH 3 TIMES A DAY AS NEEDED FOR ITCHING (SEDATION PRECAUTIONS)   ibuprofen  (ADVIL ) 800 MG tablet TAKE 1 TABLET (800 MG TOTAL) BY MOUTH 3 (THREE) TIMES DAILY AS NEEDED FOR MODERATE PAIN.   lidocaine  (LIDODERM ) 5 % Place 1 patch onto the skin daily for 15 days.   meloxicam  (MOBIC ) 15 MG tablet Take 1 tablet (15 mg total) by mouth daily.   metFORMIN  (GLUCOPHAGE ) 500 MG tablet TAKE 1  TABLET BY MOUTH 2 TIMES DAILY WITH A MEAL.   methocarbamol  (ROBAXIN ) 500 MG tablet TAKE 1 TABLET (500 MG TOTAL) BY MOUTH 3 (THREE) TIMES DAILY AS NEEDED FOR MUSCLE SPASMS (SEDATION PRECAUTIONS).   naproxen  (NAPROSYN ) 500 MG tablet Take 1 tablet (500 mg total) by mouth 2 (two) times daily with a meal.   pantoprazole  (PROTONIX ) 40 MG tablet TAKE 1 TABLET (40 MG TOTAL) BY MOUTH DAILY AS NEEDED FOR HEARTBURN   rosuvastatin  (CRESTOR ) 20 MG tablet TAKE 1 TABLET (20 MG TOTAL) BY MOUTH DAILY AFTER SUPPER.   sitaGLIPtin  (JANUVIA ) 50 MG tablet Take 1 tablet (50 mg total) by mouth daily.   SITagliptin  (ZITUVIO ) 50 MG TABS Take 50 mg by mouth daily.   tadalafil  (CIALIS ) 20 MG tablet Take 0.5-1 tablets (10-20 mg total) by mouth every other day as needed for erectile dysfunction.   [EXPIRED] triamcinolone  acetonide (KENALOG -40) injection 40 mg    No facility-administered encounter medications on file as of 01/23/2024.   "

## 2024-01-23 ENCOUNTER — Ambulatory Visit: Payer: Self-pay | Admitting: Family Medicine

## 2024-01-23 ENCOUNTER — Encounter: Payer: Self-pay | Admitting: Family Medicine

## 2024-01-23 VITALS — BP 118/78 | HR 84 | Temp 98.2°F | Ht 69.75 in | Wt 190.5 lb

## 2024-01-23 DIAGNOSIS — M25511 Pain in right shoulder: Secondary | ICD-10-CM | POA: Diagnosis not present

## 2024-01-23 DIAGNOSIS — S46011A Strain of muscle(s) and tendon(s) of the rotator cuff of right shoulder, initial encounter: Secondary | ICD-10-CM

## 2024-01-23 DIAGNOSIS — M542 Cervicalgia: Secondary | ICD-10-CM

## 2024-01-23 MED ORDER — TRIAMCINOLONE ACETONIDE 40 MG/ML IJ SUSP
40.0000 mg | Freq: Once | INTRAMUSCULAR | Status: AC
  Administered 2024-01-23: 40 mg via INTRA_ARTICULAR

## 2024-01-23 NOTE — Patient Instructions (Signed)
 It sounds like you hurt your shoulder and neck at work - I would contact your HR department or your boss to talk about it

## 2024-01-25 ENCOUNTER — Encounter: Payer: Self-pay | Admitting: Family Medicine

## 2024-01-28 ENCOUNTER — Ambulatory Visit: Admission: RE | Admit: 2024-01-28 | Source: Ambulatory Visit

## 2024-01-28 NOTE — Addendum Note (Signed)
 Addended by: WATT MIRZA on: 01/28/2024 10:25 AM   Modules accepted: Orders

## 2024-01-29 ENCOUNTER — Other Ambulatory Visit: Payer: Self-pay | Admitting: Family Medicine

## 2024-01-29 NOTE — Telephone Encounter (Signed)
 Appreciate Dr Eleanore input

## 2024-01-30 NOTE — Telephone Encounter (Signed)
 See phone note from 12/21/2023 Again getting request from pharmacy to change medications. Can we call pharmacy or patient to clarify which med is preferred (would want to stay with sitagliptin  50mg  if possible). Different brands include Januvia  and Zituvio .

## 2024-02-04 ENCOUNTER — Encounter: Payer: Self-pay | Admitting: Family Medicine

## 2024-02-06 ENCOUNTER — Telehealth: Payer: Self-pay | Admitting: Family Medicine

## 2024-02-06 ENCOUNTER — Ambulatory Visit: Admitting: Physical Therapy

## 2024-02-06 ENCOUNTER — Ambulatory Visit

## 2024-02-06 NOTE — Telephone Encounter (Addendum)
 Gary Long from front desk said that the nurse visit for flu shot for this afternoon was at her window and pt was sick and declining flu shot today., I told Gary Long Gary Long and to cancel appt and pt could reschedule  flu shot when symptoms are gone. Gary Long said pt wants a form that pt has declined the flu shot to take with him today. Pt said has cough and cold symptoms. 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 I explained last CPX was 01/2023 and pt said needs to get form filled out right away. I explained pt was to return for FU in 09/2023 and last physical was 1/*31/2025.  Pt also said that he needs documentation that pt declined flu shot today.Gary Long Gary Long was looking for next available CPX appt that would be soon., I spoke with Gary Long team lead and she said pt would need appt to get  form filled out and she did not feel comfortable writing a letter about flu shot being declined due to sickness without pt being evaluated for sickness. I explained to pt and advised a note could be sent to Gary Long to see if he would OK letter about pt declining flu shot and was not sure if he would get response today. I explained to pt if needed form filled out today pt could go to [facility] and get form completed today pt took form with him.. Pt said to schedule him tomorrow to get flu shot.I explained that if he was sick tomorrow we could not give pt a flu shot and  pt said he knows his body and schedule appt on Mon for flu shot. Gary Long scheduled pt for nurse visit on 02/12/24 for flu shot. Pt also scheduled appt for CPX and form to be completed on 02/26/24. Pt said to send note to Gary Long to see if  he wold OK letter about pt declining flu shot today even though NV and Cpx was scheduled.sending note to Gary Long.,

## 2024-02-06 NOTE — Telephone Encounter (Signed)
 Pt stated he would like a declaration form to decline the flu shot of today since he sick with coughing and cold. He needs the form of this week but however he had to r/s it and was wondering if it could be approved by his doctor.

## 2024-02-06 NOTE — Telephone Encounter (Addendum)
 We can provide letter saying he was scheduled for nurse visit for flu shot today but declined due to feeling sick. That is all I would write in a letter - in case he needs this for work excuse purposes for today.   If he needs form filled out, he could drop off for me to review but I would not guarantee that I could fill it out without OV - would have to first review the form. Reasonable to offer [facility] for form eval if needs this done today.   Ok to place if open CPE sooner slot if available but I'm not sure one will be available sooner than 02/26/2024

## 2024-02-06 NOTE — Telephone Encounter (Signed)
 Rena I believe you had conversation with patient today when patient came in for nurse visit today. If you don't mind documenting the conversation in here. I see patient has been given appointment for office visit on 2/17 to have form filled out.   I am letting provider know that patient requested letter that he came in office but declined flu shot today. I am not sure if that is something we can provide?

## 2024-02-07 NOTE — Telephone Encounter (Signed)
 I spoke with pt; pt notified as instructed by Dr Rilla  and pt voiced understanding. Pt said he does not need letter since he is going to get flu shot next Tues. Pt said he will keep appt already scheduled for CPX and will bring form at that time.Randall at front desk found one appt that was sooner than appt already scheduled for CPx but was not convenient for pt. I asked pt if he was feeling any better and pt said no but he would be OK by Tues so he can get flu shot.pt ended call. Sending FYI to Dr KANDICE.

## 2024-02-12 ENCOUNTER — Ambulatory Visit

## 2024-02-12 DIAGNOSIS — Z23 Encounter for immunization: Secondary | ICD-10-CM

## 2024-02-26 ENCOUNTER — Ambulatory Visit: Admitting: Family Medicine

## 2024-02-26 ENCOUNTER — Encounter: Admitting: Family Medicine

## 2024-03-03 ENCOUNTER — Ambulatory Visit: Admitting: Orthopedic Surgery
